# Patient Record
Sex: Female | Born: 1955 | Race: White | Hispanic: No | Marital: Single | Smoking: Never smoker
Health system: Southern US, Community
[De-identification: ages and names within clinical notes are randomized; demographics above are authoritative.]

## PROBLEM LIST (undated history)

## (undated) DIAGNOSIS — N289 Disorder of kidney and ureter, unspecified: Secondary | ICD-10-CM

## (undated) DIAGNOSIS — M25561 Pain in right knee: Secondary | ICD-10-CM

## (undated) DIAGNOSIS — M25562 Pain in left knee: Secondary | ICD-10-CM

## (undated) DIAGNOSIS — M1990 Unspecified osteoarthritis, unspecified site: Secondary | ICD-10-CM

## (undated) DIAGNOSIS — C801 Malignant (primary) neoplasm, unspecified: Secondary | ICD-10-CM

## (undated) DIAGNOSIS — I10 Essential (primary) hypertension: Secondary | ICD-10-CM

## (undated) DIAGNOSIS — C541 Malignant neoplasm of endometrium: Secondary | ICD-10-CM

## (undated) HISTORY — DX: Pain in right knee: M25.561

## (undated) HISTORY — PX: ABDOMINAL HYSTERECTOMY: SHX81

## (undated) HISTORY — DX: Unspecified osteoarthritis, unspecified site: M19.90

## (undated) HISTORY — DX: Pain in left knee: M25.562

## (undated) HISTORY — DX: Essential (primary) hypertension: I10

## (undated) HISTORY — DX: Malignant neoplasm of endometrium: C54.1

---

## 2004-12-21 ENCOUNTER — Emergency Department: Payer: Self-pay | Admitting: Emergency Medicine

## 2010-04-14 ENCOUNTER — Ambulatory Visit: Payer: Self-pay | Admitting: Family Medicine

## 2010-07-13 ENCOUNTER — Ambulatory Visit: Payer: Self-pay | Admitting: Family Medicine

## 2010-08-23 ENCOUNTER — Ambulatory Visit: Payer: Self-pay | Admitting: Obstetrics and Gynecology

## 2010-08-26 ENCOUNTER — Ambulatory Visit: Payer: Self-pay | Admitting: Obstetrics and Gynecology

## 2010-08-31 LAB — PATHOLOGY REPORT

## 2010-09-07 ENCOUNTER — Ambulatory Visit: Payer: Self-pay | Admitting: Gynecologic Oncology

## 2010-09-10 ENCOUNTER — Ambulatory Visit: Payer: Self-pay | Admitting: Obstetrics and Gynecology

## 2010-09-14 ENCOUNTER — Inpatient Hospital Stay: Payer: Self-pay | Admitting: Obstetrics and Gynecology

## 2010-09-20 LAB — PATHOLOGY REPORT

## 2010-10-04 ENCOUNTER — Ambulatory Visit: Payer: Self-pay | Admitting: Gynecologic Oncology

## 2010-11-04 ENCOUNTER — Ambulatory Visit: Payer: Self-pay | Admitting: Gynecologic Oncology

## 2011-02-08 ENCOUNTER — Ambulatory Visit: Payer: Self-pay | Admitting: Gynecologic Oncology

## 2011-02-09 LAB — CA 125: CA 125: 9.8 U/mL (ref 0.0–34.0)

## 2011-03-04 ENCOUNTER — Ambulatory Visit: Payer: Self-pay | Admitting: Gynecologic Oncology

## 2011-06-14 ENCOUNTER — Ambulatory Visit: Payer: Self-pay | Admitting: Gynecologic Oncology

## 2011-06-15 LAB — CA 125: CA 125: 10 U/mL (ref 0.0–34.0)

## 2011-07-04 ENCOUNTER — Ambulatory Visit: Payer: Self-pay | Admitting: Gynecologic Oncology

## 2011-10-18 ENCOUNTER — Ambulatory Visit: Payer: Self-pay | Admitting: Gynecologic Oncology

## 2011-10-19 LAB — CA 125: CA 125: 9.5 U/mL (ref 0.0–34.0)

## 2011-11-04 ENCOUNTER — Ambulatory Visit: Payer: Self-pay | Admitting: Gynecologic Oncology

## 2012-01-04 HISTORY — PX: GALLBLADDER SURGERY: SHX652

## 2012-02-21 ENCOUNTER — Ambulatory Visit: Payer: Self-pay | Admitting: Gynecologic Oncology

## 2012-02-22 LAB — CA 125: CA 125: 9.5 U/mL (ref 0.0–34.0)

## 2012-03-03 ENCOUNTER — Ambulatory Visit: Payer: Self-pay | Admitting: Gynecologic Oncology

## 2012-08-22 ENCOUNTER — Ambulatory Visit: Payer: Self-pay | Admitting: Family Medicine

## 2012-09-01 ENCOUNTER — Emergency Department: Payer: Self-pay | Admitting: Emergency Medicine

## 2012-09-01 LAB — CBC
HCT: 43.9 % (ref 35.0–47.0)
MCH: 29.5 pg (ref 26.0–34.0)
MCV: 88 fL (ref 80–100)
RBC: 5.01 10*6/uL (ref 3.80–5.20)
RDW: 13.4 % (ref 11.5–14.5)
WBC: 8.5 10*3/uL (ref 3.6–11.0)

## 2012-09-01 LAB — URINALYSIS, COMPLETE
Bacteria: NONE SEEN
Glucose,UR: NEGATIVE mg/dL (ref 0–75)
Ketone: NEGATIVE
Nitrite: NEGATIVE
Ph: 5 (ref 4.5–8.0)
RBC,UR: 3 /HPF (ref 0–5)
Specific Gravity: 1.015 (ref 1.003–1.030)
Squamous Epithelial: 3

## 2012-09-01 LAB — LIPASE, BLOOD: Lipase: 119 U/L (ref 73–393)

## 2012-09-01 LAB — COMPREHENSIVE METABOLIC PANEL
Albumin: 4.3 g/dL (ref 3.4–5.0)
Anion Gap: 6 — ABNORMAL LOW (ref 7–16)
BUN: 22 mg/dL — ABNORMAL HIGH (ref 7–18)
Bilirubin,Total: 0.8 mg/dL (ref 0.2–1.0)
Calcium, Total: 9.5 mg/dL (ref 8.5–10.1)
EGFR (African American): 50 — ABNORMAL LOW
Glucose: 105 mg/dL — ABNORMAL HIGH (ref 65–99)
SGOT(AST): 25 U/L (ref 15–37)
SGPT (ALT): 42 U/L (ref 12–78)
Sodium: 135 mmol/L — ABNORMAL LOW (ref 136–145)

## 2012-09-03 ENCOUNTER — Ambulatory Visit: Payer: Self-pay | Admitting: Family Medicine

## 2012-09-26 ENCOUNTER — Ambulatory Visit: Payer: Self-pay | Admitting: Surgery

## 2012-10-03 ENCOUNTER — Ambulatory Visit: Payer: Self-pay | Admitting: Family Medicine

## 2012-10-04 ENCOUNTER — Ambulatory Visit: Payer: Self-pay | Admitting: Surgery

## 2012-10-04 LAB — HEPATIC FUNCTION PANEL A (ARMC)
Albumin: 3.9 g/dL (ref 3.4–5.0)
SGPT (ALT): 36 U/L (ref 12–78)
Total Protein: 7.5 g/dL (ref 6.4–8.2)

## 2012-10-05 LAB — PATHOLOGY REPORT

## 2012-11-05 ENCOUNTER — Ambulatory Visit: Payer: Self-pay | Admitting: Family Medicine

## 2012-12-03 ENCOUNTER — Ambulatory Visit: Payer: Self-pay | Admitting: Family Medicine

## 2013-01-09 ENCOUNTER — Ambulatory Visit: Payer: Self-pay | Admitting: Family Medicine

## 2013-02-03 ENCOUNTER — Ambulatory Visit: Payer: Self-pay | Admitting: Family Medicine

## 2013-08-07 ENCOUNTER — Ambulatory Visit: Payer: Self-pay | Admitting: Family Medicine

## 2013-08-22 ENCOUNTER — Ambulatory Visit: Payer: Self-pay | Admitting: Family Medicine

## 2013-08-27 ENCOUNTER — Emergency Department: Payer: Self-pay | Admitting: Emergency Medicine

## 2013-08-27 LAB — HEPATIC FUNCTION PANEL A (ARMC)
ALT: 25 U/L
Albumin: 3.2 g/dL — ABNORMAL LOW (ref 3.4–5.0)
Alkaline Phosphatase: 75 U/L
Bilirubin, Direct: <0.1 mg/dL (ref 0.00–0.20)
Bilirubin,Total: 0.7 mg/dL (ref 0.2–1.0)
SGOT(AST): 21 U/L (ref 15–37)
Total Protein: 7.1 g/dL (ref 6.4–8.2)

## 2013-08-27 LAB — BODY FLUID CELL COUNT WITH DIFFERENTIAL
BASOS ABS: 0 %
EOS PCT: 0 %
LYMPHS PCT: 50 %
NEUTROS PCT: 4 %
NUCLEATED CELL COUNT: 230 /mm3
OTHER MONONUCLEAR CELLS: 47 %
Other Cells BF: 0 %

## 2013-08-27 LAB — CBC
HCT: 42.6 % (ref 35.0–47.0)
HGB: 13.8 g/dL (ref 12.0–16.0)
MCH: 26.7 pg (ref 26.0–34.0)
MCHC: 32.5 g/dL (ref 32.0–36.0)
MCV: 82 fL (ref 80–100)
PLATELETS: 491 10*3/uL — AB (ref 150–440)
RBC: 5.18 10*6/uL (ref 3.80–5.20)
RDW: 14 % (ref 11.5–14.5)
WBC: 6.6 10*3/uL (ref 3.6–11.0)

## 2013-08-27 LAB — BASIC METABOLIC PANEL
Anion Gap: 12 (ref 7–16)
BUN: 12 mg/dL (ref 7–18)
CHLORIDE: 104 mmol/L (ref 98–107)
Calcium, Total: 8.8 mg/dL (ref 8.5–10.1)
Co2: 25 mmol/L (ref 21–32)
Creatinine: 1.19 mg/dL (ref 0.60–1.30)
EGFR (African American): 57 — ABNORMAL LOW
GFR CALC NON AF AMER: 50 — AB
Glucose: 90 mg/dL (ref 65–99)
Osmolality: 281 (ref 275–301)
Potassium: 4 mmol/L (ref 3.5–5.1)
SODIUM: 141 mmol/L (ref 136–145)

## 2013-08-27 LAB — TROPONIN I: Troponin-I: <0.02 ng/mL

## 2013-08-27 LAB — ALBUMIN, FLUID (OTHER): Body Fluid Albumin: 2.5 g/dL

## 2013-08-27 LAB — GLUCOSE, SEROUS FLUID: GLUCOSE, BODY FLUID: 47 mg/dL

## 2013-08-27 LAB — PROTIME-INR
INR: 1
Prothrombin Time: 12.6 secs (ref 11.5–14.7)

## 2013-08-27 LAB — AMYLASE, BODY FLUID: Amylase, Body Fluid: 80 U/L

## 2013-08-31 LAB — BODY FLUID CULTURE

## 2013-09-10 ENCOUNTER — Institutional Professional Consult (permissible substitution): Payer: Self-pay | Admitting: Internal Medicine

## 2013-09-24 ENCOUNTER — Emergency Department: Payer: Self-pay | Admitting: Emergency Medicine

## 2013-09-24 LAB — CBC WITH DIFFERENTIAL/PLATELET
BASOS ABS: 0 10*3/uL (ref 0.0–0.1)
Basophil %: 0.6 %
EOS PCT: 1.7 %
Eosinophil #: 0.1 10*3/uL (ref 0.0–0.7)
HCT: 42.8 % (ref 35.0–47.0)
HGB: 13.4 g/dL (ref 12.0–16.0)
Lymphocyte #: 1.5 10*3/uL (ref 1.0–3.6)
Lymphocyte %: 18.5 %
MCH: 25.5 pg — ABNORMAL LOW (ref 26.0–34.0)
MCHC: 31.3 g/dL — ABNORMAL LOW (ref 32.0–36.0)
MCV: 82 fL (ref 80–100)
Monocyte #: 0.6 x10 3/mm (ref 0.2–0.9)
Monocyte %: 6.9 %
Neutrophil #: 5.8 10*3/uL (ref 1.4–6.5)
Neutrophil %: 72.3 %
PLATELETS: 495 10*3/uL — AB (ref 150–440)
RBC: 5.24 10*6/uL — AB (ref 3.80–5.20)
RDW: 14.5 % (ref 11.5–14.5)
WBC: 8 10*3/uL (ref 3.6–11.0)

## 2013-09-24 LAB — COMPREHENSIVE METABOLIC PANEL
Albumin: 2.8 g/dL — ABNORMAL LOW (ref 3.4–5.0)
Alkaline Phosphatase: 80 U/L
Anion Gap: 4 — ABNORMAL LOW (ref 7–16)
BUN: 20 mg/dL — ABNORMAL HIGH (ref 7–18)
Bilirubin,Total: 0.5 mg/dL (ref 0.2–1.0)
CALCIUM: 9.1 mg/dL (ref 8.5–10.1)
CREATININE: 1.26 mg/dL (ref 0.60–1.30)
Chloride: 105 mmol/L (ref 98–107)
Co2: 29 mmol/L (ref 21–32)
EGFR (Non-African Amer.): 46 — ABNORMAL LOW
GFR CALC AF AMER: 54 — AB
Glucose: 83 mg/dL (ref 65–99)
Osmolality: 277 (ref 275–301)
Potassium: 4.4 mmol/L (ref 3.5–5.1)
SGOT(AST): 31 U/L (ref 15–37)
SGPT (ALT): 29 U/L
Sodium: 138 mmol/L (ref 136–145)
TOTAL PROTEIN: 6.7 g/dL (ref 6.4–8.2)

## 2013-09-24 LAB — PROTIME-INR
INR: 1
PROTHROMBIN TIME: 13.3 s (ref 11.5–14.7)

## 2013-10-03 DIAGNOSIS — K7460 Unspecified cirrhosis of liver: Secondary | ICD-10-CM | POA: Insufficient documentation

## 2013-10-08 ENCOUNTER — Ambulatory Visit: Payer: Self-pay | Admitting: Gastroenterology

## 2013-10-11 ENCOUNTER — Ambulatory Visit: Payer: Self-pay | Admitting: Oncology

## 2013-10-11 LAB — CBC CANCER CENTER
Basophil #: 0.1 x10 3/mm (ref 0.0–0.1)
Basophil %: 1 %
Eosinophil #: 0.2 x10 3/mm (ref 0.0–0.7)
Eosinophil %: 2.2 %
HCT: 42.6 % (ref 35.0–47.0)
HGB: 13.6 g/dL (ref 12.0–16.0)
Lymphocyte %: 21.9 %
Lymphs Abs: 2.2 x10 3/mm (ref 1.0–3.6)
MCH: 25.7 pg — ABNORMAL LOW (ref 26.0–34.0)
MCHC: 31.9 g/dL — ABNORMAL LOW (ref 32.0–36.0)
MCV: 80 fL (ref 80–100)
Monocyte #: 0.6 x10 3/mm (ref 0.2–0.9)
Monocyte %: 6.3 %
Neutrophil #: 6.8 x10 3/mm — ABNORMAL HIGH (ref 1.4–6.5)
Neutrophil %: 68.6 %
Platelet: 522 x10 3/mm — ABNORMAL HIGH (ref 150–440)
RBC: 5.3 x10 6/mm — ABNORMAL HIGH (ref 3.80–5.20)
RDW: 15.2 % — ABNORMAL HIGH (ref 11.5–14.5)
WBC: 9.9 x10 3/mm (ref 3.6–11.0)

## 2013-10-11 LAB — COMPREHENSIVE METABOLIC PANEL WITH GFR
Albumin: 3.1 g/dL — ABNORMAL LOW (ref 3.4–5.0)
Alkaline Phosphatase: 99 U/L
Anion Gap: 9 (ref 7–16)
BUN: 27 mg/dL — ABNORMAL HIGH (ref 7–18)
Bilirubin,Total: 0.5 mg/dL (ref 0.2–1.0)
Calcium, Total: 9.1 mg/dL (ref 8.5–10.1)
Chloride: 98 mmol/L (ref 98–107)
Co2: 27 mmol/L (ref 21–32)
Creatinine: 1.56 mg/dL — ABNORMAL HIGH (ref 0.60–1.30)
EGFR (African American): 44 — ABNORMAL LOW
EGFR (Non-African Amer.): 36 — ABNORMAL LOW
Glucose: 106 mg/dL — ABNORMAL HIGH (ref 65–99)
Osmolality: 274 (ref 275–301)
Potassium: 4.1 mmol/L (ref 3.5–5.1)
SGOT(AST): 21 U/L (ref 15–37)
SGPT (ALT): 31 U/L
Sodium: 134 mmol/L — ABNORMAL LOW (ref 136–145)
Total Protein: 6.8 g/dL (ref 6.4–8.2)

## 2013-10-11 LAB — PROTIME-INR
INR: 1
Prothrombin Time: 13 secs (ref 11.5–14.7)

## 2013-10-13 LAB — CA 125: CA 125: 631.1 U/mL — AB (ref 0.0–34.0)

## 2013-10-15 ENCOUNTER — Ambulatory Visit: Payer: Self-pay | Admitting: Gastroenterology

## 2013-10-21 ENCOUNTER — Ambulatory Visit: Payer: Self-pay | Admitting: Oncology

## 2013-10-22 ENCOUNTER — Ambulatory Visit: Payer: Self-pay | Admitting: Oncology

## 2013-10-31 ENCOUNTER — Ambulatory Visit: Payer: Self-pay | Admitting: Vascular Surgery

## 2013-11-03 ENCOUNTER — Ambulatory Visit: Payer: Self-pay | Admitting: Oncology

## 2013-11-06 LAB — CBC CANCER CENTER
BASOS ABS: 0.1 x10 3/mm (ref 0.0–0.1)
Basophil %: 0.7 %
EOS PCT: 1.7 %
Eosinophil #: 0.2 x10 3/mm (ref 0.0–0.7)
HCT: 42 % (ref 35.0–47.0)
HGB: 13.1 g/dL (ref 12.0–16.0)
Lymphocyte #: 1.5 x10 3/mm (ref 1.0–3.6)
Lymphocyte %: 14.3 %
MCH: 24.9 pg — ABNORMAL LOW (ref 26.0–34.0)
MCHC: 31.1 g/dL — ABNORMAL LOW (ref 32.0–36.0)
MCV: 80 fL (ref 80–100)
MONO ABS: 0.7 x10 3/mm (ref 0.2–0.9)
Monocyte %: 6.5 %
NEUTROS ABS: 7.9 x10 3/mm — AB (ref 1.4–6.5)
NEUTROS PCT: 76.8 %
Platelet: 397 x10 3/mm (ref 150–440)
RBC: 5.24 10*6/uL — ABNORMAL HIGH (ref 3.80–5.20)
RDW: 15.5 % — ABNORMAL HIGH (ref 11.5–14.5)
WBC: 10.3 x10 3/mm (ref 3.6–11.0)

## 2013-11-06 LAB — COMPREHENSIVE METABOLIC PANEL
ALK PHOS: 125 U/L — AB
ALT: 39 U/L
Albumin: 3.1 g/dL — ABNORMAL LOW (ref 3.4–5.0)
Anion Gap: 10 (ref 7–16)
BILIRUBIN TOTAL: 0.6 mg/dL (ref 0.2–1.0)
BUN: 30 mg/dL — AB (ref 7–18)
Calcium, Total: 9.6 mg/dL (ref 8.5–10.1)
Chloride: 98 mmol/L (ref 98–107)
Co2: 27 mmol/L (ref 21–32)
Creatinine: 1.42 mg/dL — ABNORMAL HIGH (ref 0.60–1.30)
EGFR (African American): 49 — ABNORMAL LOW
GFR CALC NON AF AMER: 40 — AB
GLUCOSE: 143 mg/dL — AB (ref 65–99)
OSMOLALITY: 279 (ref 275–301)
Potassium: 4 mmol/L (ref 3.5–5.1)
SGOT(AST): 25 U/L (ref 15–37)
Sodium: 135 mmol/L — ABNORMAL LOW (ref 136–145)
Total Protein: 7.4 g/dL (ref 6.4–8.2)

## 2013-11-13 LAB — CBC CANCER CENTER
BASOS ABS: 0.1 x10 3/mm (ref 0.0–0.1)
Basophil %: 0.9 %
Eosinophil #: 0.3 x10 3/mm (ref 0.0–0.7)
Eosinophil %: 2.6 %
HCT: 41.1 % (ref 35.0–47.0)
HGB: 13.1 g/dL (ref 12.0–16.0)
Lymphocyte #: 2 x10 3/mm (ref 1.0–3.6)
Lymphocyte %: 18.2 %
MCH: 25.1 pg — ABNORMAL LOW (ref 26.0–34.0)
MCHC: 31.8 g/dL — AB (ref 32.0–36.0)
MCV: 79 fL — AB (ref 80–100)
Monocyte #: 1.3 x10 3/mm — ABNORMAL HIGH (ref 0.2–0.9)
Monocyte %: 11.5 %
NEUTROS ABS: 7.5 x10 3/mm — AB (ref 1.4–6.5)
Neutrophil %: 66.8 %
Platelet: 304 x10 3/mm (ref 150–440)
RBC: 5.21 10*6/uL — ABNORMAL HIGH (ref 3.80–5.20)
RDW: 15.3 % — AB (ref 11.5–14.5)
WBC: 11.2 x10 3/mm — AB (ref 3.6–11.0)

## 2013-11-19 LAB — CBC CANCER CENTER
Basophil #: 0.1 x10 3/mm (ref 0.0–0.1)
Basophil %: 0.3 %
Eosinophil #: 0.1 x10 3/mm (ref 0.0–0.7)
Eosinophil %: 0.3 %
HCT: 39.6 % (ref 35.0–47.0)
HGB: 12.4 g/dL (ref 12.0–16.0)
Lymphocyte #: 1.7 x10 3/mm (ref 1.0–3.6)
Lymphocyte %: 8.7 %
MCH: 25 pg — ABNORMAL LOW (ref 26.0–34.0)
MCHC: 31.3 g/dL — ABNORMAL LOW (ref 32.0–36.0)
MCV: 80 fL (ref 80–100)
MONO ABS: 1 x10 3/mm — AB (ref 0.2–0.9)
Monocyte %: 5.3 %
NEUTROS PCT: 85.4 %
Neutrophil #: 16.2 x10 3/mm — ABNORMAL HIGH (ref 1.4–6.5)
Platelet: 192 x10 3/mm (ref 150–440)
RBC: 4.96 10*6/uL (ref 3.80–5.20)
RDW: 16.3 % — ABNORMAL HIGH (ref 11.5–14.5)
WBC: 19 x10 3/mm — AB (ref 3.6–11.0)

## 2013-11-27 LAB — CBC CANCER CENTER
Basophil #: 0.1 x10 3/mm (ref 0.0–0.1)
Basophil %: 1.1 %
Eosinophil #: 0 x10 3/mm (ref 0.0–0.7)
Eosinophil %: 0.2 %
HCT: 36 % (ref 35.0–47.0)
HGB: 11.6 g/dL — AB (ref 12.0–16.0)
Lymphocyte #: 1.5 x10 3/mm (ref 1.0–3.6)
Lymphocyte %: 17.9 %
MCH: 25.8 pg — ABNORMAL LOW (ref 26.0–34.0)
MCHC: 32.2 g/dL (ref 32.0–36.0)
MCV: 80 fL (ref 80–100)
Monocyte #: 0.7 x10 3/mm (ref 0.2–0.9)
Monocyte %: 8.4 %
NEUTROS ABS: 6.1 x10 3/mm (ref 1.4–6.5)
Neutrophil %: 72.4 %
PLATELETS: 503 x10 3/mm — AB (ref 150–440)
RBC: 4.49 10*6/uL (ref 3.80–5.20)
RDW: 17.4 % — ABNORMAL HIGH (ref 11.5–14.5)
WBC: 8.4 x10 3/mm (ref 3.6–11.0)

## 2013-12-02 LAB — COMPREHENSIVE METABOLIC PANEL
ALBUMIN: 3.2 g/dL — AB (ref 3.4–5.0)
AST: 14 U/L — AB (ref 15–37)
Alkaline Phosphatase: 138 U/L — ABNORMAL HIGH
Anion Gap: 12 (ref 7–16)
BILIRUBIN TOTAL: 0.4 mg/dL (ref 0.2–1.0)
BUN: 22 mg/dL — ABNORMAL HIGH (ref 7–18)
CALCIUM: 9.6 mg/dL (ref 8.5–10.1)
CHLORIDE: 101 mmol/L (ref 98–107)
CREATININE: 1.13 mg/dL (ref 0.60–1.30)
Co2: 28 mmol/L (ref 21–32)
GFR CALC NON AF AMER: 52 — AB
GLUCOSE: 134 mg/dL — AB (ref 65–99)
Osmolality: 287 (ref 275–301)
POTASSIUM: 3.4 mmol/L — AB (ref 3.5–5.1)
SGPT (ALT): 25 U/L
SODIUM: 141 mmol/L (ref 136–145)
Total Protein: 7 g/dL (ref 6.4–8.2)

## 2013-12-02 LAB — CBC CANCER CENTER
BASOS ABS: 0.1 x10 3/mm (ref 0.0–0.1)
BASOS PCT: 1 %
EOS ABS: 0.1 x10 3/mm (ref 0.0–0.7)
Eosinophil %: 1.4 %
HCT: 36 % (ref 35.0–47.0)
HGB: 11.5 g/dL — AB (ref 12.0–16.0)
LYMPHS ABS: 1.7 x10 3/mm (ref 1.0–3.6)
LYMPHS PCT: 19.1 %
MCH: 25.8 pg — ABNORMAL LOW (ref 26.0–34.0)
MCHC: 32.1 g/dL (ref 32.0–36.0)
MCV: 80 fL (ref 80–100)
MONO ABS: 0.6 x10 3/mm (ref 0.2–0.9)
MONOS PCT: 6.5 %
NEUTROS ABS: 6.3 x10 3/mm (ref 1.4–6.5)
Neutrophil %: 72 %
PLATELETS: 507 x10 3/mm — AB (ref 150–440)
RBC: 4.48 10*6/uL (ref 3.80–5.20)
RDW: 18 % — ABNORMAL HIGH (ref 11.5–14.5)
WBC: 8.7 x10 3/mm (ref 3.6–11.0)

## 2013-12-02 LAB — MAGNESIUM: MAGNESIUM: 1.6 mg/dL — AB

## 2013-12-03 ENCOUNTER — Ambulatory Visit: Payer: Self-pay | Admitting: Oncology

## 2013-12-09 LAB — CBC CANCER CENTER
BASOS ABS: 0.1 x10 3/mm (ref 0.0–0.1)
Basophil %: 2.2 %
EOS PCT: 3.2 %
Eosinophil #: 0.1 x10 3/mm (ref 0.0–0.7)
HCT: 38.1 % (ref 35.0–47.0)
HGB: 12.5 g/dL (ref 12.0–16.0)
LYMPHS PCT: 28.9 %
Lymphocyte #: 1.2 x10 3/mm (ref 1.0–3.6)
MCH: 25.9 pg — ABNORMAL LOW (ref 26.0–34.0)
MCHC: 32.7 g/dL (ref 32.0–36.0)
MCV: 79 fL — ABNORMAL LOW (ref 80–100)
MONOS PCT: 2.7 %
Monocyte #: 0.1 x10 3/mm — ABNORMAL LOW (ref 0.2–0.9)
NEUTROS ABS: 2.6 x10 3/mm (ref 1.4–6.5)
NEUTROS PCT: 63 %
PLATELETS: 361 x10 3/mm (ref 150–440)
RBC: 4.82 10*6/uL (ref 3.80–5.20)
RDW: 18 % — ABNORMAL HIGH (ref 11.5–14.5)
WBC: 4.1 x10 3/mm (ref 3.6–11.0)

## 2013-12-16 LAB — CBC CANCER CENTER
BASOS ABS: 0 x10 3/mm (ref 0.0–0.1)
Basophil %: 1.2 %
EOS ABS: 0.1 x10 3/mm (ref 0.0–0.7)
EOS PCT: 2.5 %
HCT: 39 % (ref 35.0–47.0)
HGB: 12.3 g/dL (ref 12.0–16.0)
LYMPHS ABS: 1.3 x10 3/mm (ref 1.0–3.6)
Lymphocyte %: 40 %
MCH: 26.4 pg (ref 26.0–34.0)
MCHC: 31.7 g/dL — ABNORMAL LOW (ref 32.0–36.0)
MCV: 84 fL (ref 80–100)
MONO ABS: 0.4 x10 3/mm (ref 0.2–0.9)
Monocyte %: 13.1 %
NEUTROS ABS: 1.4 x10 3/mm (ref 1.4–6.5)
Neutrophil %: 43.2 %
Platelet: 257 x10 3/mm (ref 150–440)
RBC: 4.67 10*6/uL (ref 3.80–5.20)
RDW: 20.5 % — ABNORMAL HIGH (ref 11.5–14.5)
WBC: 3.2 x10 3/mm — ABNORMAL LOW (ref 3.6–11.0)

## 2013-12-17 LAB — CA 125: CA 125: 282.1 U/mL — ABNORMAL HIGH (ref 0.0–34.0)

## 2013-12-23 LAB — CBC CANCER CENTER
BASOS ABS: 0.1 x10 3/mm (ref 0.0–0.1)
BASOS PCT: 1.1 %
EOS ABS: 0.1 x10 3/mm (ref 0.0–0.7)
Eosinophil %: 0.9 %
HCT: 38.9 % (ref 35.0–47.0)
HGB: 12.7 g/dL (ref 12.0–16.0)
Lymphocyte #: 1.3 x10 3/mm (ref 1.0–3.6)
Lymphocyte %: 20.6 %
MCH: 27.1 pg (ref 26.0–34.0)
MCHC: 32.6 g/dL (ref 32.0–36.0)
MCV: 83 fL (ref 80–100)
MONO ABS: 0.5 x10 3/mm (ref 0.2–0.9)
Monocyte %: 7.6 %
NEUTROS PCT: 69.8 %
Neutrophil #: 4.4 x10 3/mm (ref 1.4–6.5)
Platelet: 258 x10 3/mm (ref 150–440)
RBC: 4.68 10*6/uL (ref 3.80–5.20)
RDW: 21.8 % — AB (ref 11.5–14.5)
WBC: 6.3 x10 3/mm (ref 3.6–11.0)

## 2013-12-23 LAB — COMPREHENSIVE METABOLIC PANEL
ANION GAP: 10 (ref 7–16)
Albumin: 3.4 g/dL (ref 3.4–5.0)
Alkaline Phosphatase: 115 U/L
BILIRUBIN TOTAL: 0.5 mg/dL (ref 0.2–1.0)
BUN: 18 mg/dL (ref 7–18)
Calcium, Total: 9 mg/dL (ref 8.5–10.1)
Chloride: 105 mmol/L (ref 98–107)
Co2: 27 mmol/L (ref 21–32)
Creatinine: 1.15 mg/dL (ref 0.60–1.30)
EGFR (African American): >60
EGFR (Non-African Amer.): 51 — ABNORMAL LOW
GLUCOSE: 123 mg/dL — AB (ref 65–99)
Osmolality: 286 (ref 275–301)
POTASSIUM: 3.8 mmol/L (ref 3.5–5.1)
SGOT(AST): 17 U/L (ref 15–37)
SGPT (ALT): 29 U/L
SODIUM: 142 mmol/L (ref 136–145)
Total Protein: 7.1 g/dL (ref 6.4–8.2)

## 2013-12-30 LAB — CBC CANCER CENTER
Basophil #: 0 x10 3/mm (ref 0.0–0.1)
Basophil %: 0.8 %
EOS ABS: 0 x10 3/mm (ref 0.0–0.7)
Eosinophil %: 1.3 %
HCT: 36.4 % (ref 35.0–47.0)
HGB: 11.8 g/dL — AB (ref 12.0–16.0)
LYMPHS ABS: 0.9 x10 3/mm — AB (ref 1.0–3.6)
Lymphocyte %: 29.9 %
MCH: 27.3 pg (ref 26.0–34.0)
MCHC: 32.5 g/dL (ref 32.0–36.0)
MCV: 84 fL (ref 80–100)
MONO ABS: 0 x10 3/mm — AB (ref 0.2–0.9)
MONOS PCT: 1.4 %
Neutrophil #: 1.9 x10 3/mm (ref 1.4–6.5)
Neutrophil %: 66.6 %
Platelet: 200 x10 3/mm (ref 150–440)
RBC: 4.33 10*6/uL (ref 3.80–5.20)
RDW: 20.9 % — AB (ref 11.5–14.5)
WBC: 2.9 x10 3/mm — AB (ref 3.6–11.0)

## 2014-01-03 ENCOUNTER — Ambulatory Visit: Payer: Self-pay | Admitting: Oncology

## 2014-01-06 LAB — CBC CANCER CENTER
BASOS ABS: 0 x10 3/mm (ref 0.0–0.1)
BASOS PCT: 0.9 %
Eosinophil #: 0.1 x10 3/mm (ref 0.0–0.7)
Eosinophil %: 2.2 %
HCT: 39.9 % (ref 35.0–47.0)
HGB: 13 g/dL (ref 12.0–16.0)
Lymphocyte #: 1.4 x10 3/mm (ref 1.0–3.6)
Lymphocyte %: 49.1 %
MCH: 28.2 pg (ref 26.0–34.0)
MCHC: 32.4 g/dL (ref 32.0–36.0)
MCV: 87 fL (ref 80–100)
MONO ABS: 0.5 x10 3/mm (ref 0.2–0.9)
Monocyte %: 16.6 %
NEUTROS PCT: 31.2 %
Neutrophil #: 0.9 x10 3/mm — ABNORMAL LOW (ref 1.4–6.5)
Platelet: 343 x10 3/mm (ref 150–440)
RBC: 4.59 10*6/uL (ref 3.80–5.20)
RDW: 21.4 % — AB (ref 11.5–14.5)
WBC: 2.8 x10 3/mm — ABNORMAL LOW (ref 3.6–11.0)

## 2014-01-07 LAB — CA 125: CA 125: 140.8 U/mL — AB (ref 0.0–34.0)

## 2014-01-13 LAB — COMPREHENSIVE METABOLIC PANEL
AST: 15 U/L (ref 15–37)
Albumin: 3.4 g/dL (ref 3.4–5.0)
Alkaline Phosphatase: 115 U/L
Anion Gap: 8 (ref 7–16)
BILIRUBIN TOTAL: 0.3 mg/dL (ref 0.2–1.0)
BUN: 26 mg/dL — ABNORMAL HIGH (ref 7–18)
CALCIUM: 8.7 mg/dL (ref 8.5–10.1)
CHLORIDE: 103 mmol/L (ref 98–107)
CO2: 27 mmol/L (ref 21–32)
Creatinine: 1.15 mg/dL (ref 0.60–1.30)
EGFR (African American): >60
GFR CALC NON AF AMER: 51 — AB
Glucose: 113 mg/dL — ABNORMAL HIGH (ref 65–99)
OSMOLALITY: 281 (ref 275–301)
Potassium: 4.2 mmol/L (ref 3.5–5.1)
SGPT (ALT): 29 U/L
Sodium: 138 mmol/L (ref 136–145)
Total Protein: 7 g/dL (ref 6.4–8.2)

## 2014-01-13 LAB — CBC CANCER CENTER
BASOS ABS: 0.1 x10 3/mm (ref 0.0–0.1)
Basophil %: 1 %
Eosinophil #: 0 x10 3/mm (ref 0.0–0.7)
Eosinophil %: 0.9 %
HCT: 38.5 % (ref 35.0–47.0)
HGB: 12.8 g/dL (ref 12.0–16.0)
LYMPHS ABS: 1.8 x10 3/mm (ref 1.0–3.6)
LYMPHS PCT: 32.7 %
MCH: 28.7 pg (ref 26.0–34.0)
MCHC: 33.4 g/dL (ref 32.0–36.0)
MCV: 86 fL (ref 80–100)
Monocyte #: 0.6 x10 3/mm (ref 0.2–0.9)
Monocyte %: 10.7 %
NEUTROS ABS: 3 x10 3/mm (ref 1.4–6.5)
Neutrophil %: 54.7 %
PLATELETS: 260 x10 3/mm (ref 150–440)
RBC: 4.48 10*6/uL (ref 3.80–5.20)
RDW: 20.7 % — AB (ref 11.5–14.5)
WBC: 5.5 x10 3/mm (ref 3.6–11.0)

## 2014-01-15 LAB — CA 125: CA 125: 130.3 U/mL — AB (ref 0.0–34.0)

## 2014-01-20 LAB — CBC CANCER CENTER
Basophil #: 0 x10 3/mm (ref 0.0–0.1)
Basophil %: 1.3 %
Eosinophil #: 0 x10 3/mm (ref 0.0–0.7)
Eosinophil %: 1.4 %
HCT: 39.7 % (ref 35.0–47.0)
HGB: 13 g/dL (ref 12.0–16.0)
Lymphocyte #: 1.2 x10 3/mm (ref 1.0–3.6)
Lymphocyte %: 39.3 %
MCH: 28.6 pg (ref 26.0–34.0)
MCHC: 32.8 g/dL (ref 32.0–36.0)
MCV: 87 fL (ref 80–100)
Monocyte #: 0.1 x10 3/mm — ABNORMAL LOW (ref 0.2–0.9)
Monocyte %: 1.9 %
NEUTROS ABS: 1.8 x10 3/mm (ref 1.4–6.5)
Neutrophil %: 56.1 %
PLATELETS: 224 x10 3/mm (ref 150–440)
RBC: 4.57 10*6/uL (ref 3.80–5.20)
RDW: 19 % — ABNORMAL HIGH (ref 11.5–14.5)
WBC: 3.1 x10 3/mm — ABNORMAL LOW (ref 3.6–11.0)

## 2014-01-30 LAB — CBC CANCER CENTER
BASOS ABS: 0 x10 3/mm (ref 0.0–0.1)
Basophil %: 1 %
Eosinophil #: 0 x10 3/mm (ref 0.0–0.7)
Eosinophil %: 1.4 %
HCT: 41.4 % (ref 35.0–47.0)
HGB: 13.6 g/dL (ref 12.0–16.0)
Lymphocyte #: 1.5 x10 3/mm (ref 1.0–3.6)
Lymphocyte %: 45.9 %
MCH: 29.1 pg (ref 26.0–34.0)
MCHC: 32.7 g/dL (ref 32.0–36.0)
MCV: 89 fL (ref 80–100)
Monocyte #: 0.7 x10 3/mm (ref 0.2–0.9)
Monocyte %: 19.4 %
NEUTROS ABS: 1.1 x10 3/mm — AB (ref 1.4–6.5)
Neutrophil %: 32.3 %
Platelet: 277 x10 3/mm (ref 150–440)
RBC: 4.66 10*6/uL (ref 3.80–5.20)
RDW: 19.2 % — AB (ref 11.5–14.5)
WBC: 3.3 x10 3/mm — AB (ref 3.6–11.0)

## 2014-02-03 ENCOUNTER — Ambulatory Visit: Payer: Self-pay | Admitting: Oncology

## 2014-02-03 LAB — CBC CANCER CENTER
BASOS ABS: 0.1 x10 3/mm (ref 0.0–0.1)
Basophil %: 1.3 %
EOS PCT: 0.9 %
Eosinophil #: 0 x10 3/mm (ref 0.0–0.7)
HCT: 39.2 % (ref 35.0–47.0)
HGB: 13.2 g/dL (ref 12.0–16.0)
LYMPHS ABS: 1.2 x10 3/mm (ref 1.0–3.6)
LYMPHS PCT: 27.2 %
MCH: 29.7 pg (ref 26.0–34.0)
MCHC: 33.5 g/dL (ref 32.0–36.0)
MCV: 89 fL (ref 80–100)
MONOS PCT: 9.9 %
Monocyte #: 0.4 x10 3/mm (ref 0.2–0.9)
NEUTROS ABS: 2.7 x10 3/mm (ref 1.4–6.5)
Neutrophil %: 60.7 %
Platelet: 202 x10 3/mm (ref 150–440)
RBC: 4.42 10*6/uL (ref 3.80–5.20)
RDW: 18 % — ABNORMAL HIGH (ref 11.5–14.5)
WBC: 4.5 x10 3/mm (ref 3.6–11.0)

## 2014-02-03 LAB — BASIC METABOLIC PANEL
Anion Gap: 10 (ref 7–16)
BUN: 20 mg/dL — AB (ref 7–18)
CO2: 27 mmol/L (ref 21–32)
CREATININE: 1.23 mg/dL (ref 0.60–1.30)
Calcium, Total: 8.7 mg/dL (ref 8.5–10.1)
Chloride: 103 mmol/L (ref 98–107)
EGFR (African American): 57 — ABNORMAL LOW
EGFR (Non-African Amer.): 47 — ABNORMAL LOW
Glucose: 146 mg/dL — ABNORMAL HIGH (ref 65–99)
Osmolality: 285 (ref 275–301)
Potassium: 4 mmol/L (ref 3.5–5.1)
Sodium: 140 mmol/L (ref 136–145)

## 2014-02-10 LAB — CBC CANCER CENTER
BASOS PCT: 1.3 %
Basophil #: 0 x10 3/mm (ref 0.0–0.1)
EOS PCT: 0.8 %
Eosinophil #: 0 x10 3/mm (ref 0.0–0.7)
HCT: 40.7 % (ref 35.0–47.0)
HGB: 13.4 g/dL (ref 12.0–16.0)
LYMPHS ABS: 1.1 x10 3/mm (ref 1.0–3.6)
Lymphocyte %: 36.8 %
MCH: 29.2 pg (ref 26.0–34.0)
MCHC: 32.9 g/dL (ref 32.0–36.0)
MCV: 89 fL (ref 80–100)
MONO ABS: 0.1 x10 3/mm — AB (ref 0.2–0.9)
MONOS PCT: 2.1 %
NEUTROS ABS: 1.8 x10 3/mm (ref 1.4–6.5)
Neutrophil %: 59 %
PLATELETS: 164 x10 3/mm (ref 150–440)
RBC: 4.57 10*6/uL (ref 3.80–5.20)
RDW: 17 % — ABNORMAL HIGH (ref 11.5–14.5)
WBC: 3.1 x10 3/mm — AB (ref 3.6–11.0)

## 2014-02-28 ENCOUNTER — Inpatient Hospital Stay: Payer: Self-pay | Admitting: Internal Medicine

## 2014-03-04 ENCOUNTER — Ambulatory Visit
Admit: 2014-03-04 | Disposition: A | Discharge status: Home / Self Care | Payer: Self-pay | Attending: Oncology | Admitting: Oncology

## 2014-04-02 LAB — COMPREHENSIVE METABOLIC PANEL
ALBUMIN: 3.5 g/dL
ALK PHOS: 72 U/L
ANION GAP: 5 — AB (ref 7–16)
BILIRUBIN TOTAL: 0.5 mg/dL
BUN: 17 mg/dL
CHLORIDE: 105 mmol/L
CREATININE: 0.96 mg/dL
Calcium, Total: 8.9 mg/dL
Co2: 28 mmol/L
Glucose: 119 mg/dL — ABNORMAL HIGH
POTASSIUM: 3.7 mmol/L
SGOT(AST): 22 U/L
SGPT (ALT): 17 U/L
SODIUM: 138 mmol/L
Total Protein: 6.3 g/dL — ABNORMAL LOW

## 2014-04-02 LAB — CBC CANCER CENTER
Basophil #: 0 x10 3/mm (ref 0.0–0.1)
Basophil %: 0.8 %
Eosinophil #: 0.1 x10 3/mm (ref 0.0–0.7)
Eosinophil %: 2.2 %
HCT: 35.1 % (ref 35.0–47.0)
HGB: 11.6 g/dL — ABNORMAL LOW (ref 12.0–16.0)
Lymphocyte #: 1.3 x10 3/mm (ref 1.0–3.6)
Lymphocyte %: 30.6 %
MCH: 30 pg (ref 26.0–34.0)
MCHC: 33 g/dL (ref 32.0–36.0)
MCV: 91 fL (ref 80–100)
Monocyte #: 0.5 x10 3/mm (ref 0.2–0.9)
Monocyte %: 11.2 %
Neutrophil #: 2.4 x10 3/mm (ref 1.4–6.5)
Neutrophil %: 55.2 %
PLATELETS: 229 x10 3/mm (ref 150–440)
RBC: 3.85 10*6/uL (ref 3.80–5.20)
RDW: 16.5 % — ABNORMAL HIGH (ref 11.5–14.5)
WBC: 4.3 x10 3/mm (ref 3.6–11.0)

## 2014-04-04 ENCOUNTER — Ambulatory Visit
Admit: 2014-04-04 | Disposition: A | Discharge status: Home / Self Care | Payer: Self-pay | Attending: Oncology | Admitting: Oncology

## 2014-04-09 LAB — CBC CANCER CENTER
Basophil #: 0 x10 3/mm (ref 0.0–0.1)
Basophil %: 0.8 %
EOS ABS: 0.2 x10 3/mm (ref 0.0–0.7)
EOS PCT: 4.2 %
HCT: 38.6 % (ref 35.0–47.0)
HGB: 12.8 g/dL (ref 12.0–16.0)
Lymphocyte #: 1.2 x10 3/mm (ref 1.0–3.6)
Lymphocyte %: 26.7 %
MCH: 29.9 pg (ref 26.0–34.0)
MCHC: 33 g/dL (ref 32.0–36.0)
MCV: 91 fL (ref 80–100)
MONOS PCT: 6.5 %
Monocyte #: 0.3 x10 3/mm (ref 0.2–0.9)
Neutrophil #: 2.7 x10 3/mm (ref 1.4–6.5)
Neutrophil %: 61.8 %
PLATELETS: 116 x10 3/mm — AB (ref 150–440)
RBC: 4.27 10*6/uL (ref 3.80–5.20)
RDW: 16.3 % — ABNORMAL HIGH (ref 11.5–14.5)
WBC: 4.4 x10 3/mm (ref 3.6–11.0)

## 2014-04-16 LAB — CBC CANCER CENTER
BASOS ABS: 0 x10 3/mm (ref 0.0–0.1)
Basophil %: 0.5 %
Eosinophil #: 0.1 x10 3/mm (ref 0.0–0.7)
Eosinophil %: 2.1 %
HCT: 36.8 % (ref 35.0–47.0)
HGB: 12.1 g/dL (ref 12.0–16.0)
LYMPHS ABS: 1.1 x10 3/mm (ref 1.0–3.6)
Lymphocyte %: 21.2 %
MCH: 30 pg (ref 26.0–34.0)
MCHC: 32.9 g/dL (ref 32.0–36.0)
MCV: 91 fL (ref 80–100)
Monocyte #: 0.6 x10 3/mm (ref 0.2–0.9)
Monocyte %: 10.2 %
NEUTROS ABS: 3.6 x10 3/mm (ref 1.4–6.5)
Neutrophil %: 66 %
Platelet: 161 x10 3/mm (ref 150–440)
RBC: 4.04 10*6/uL (ref 3.80–5.20)
RDW: 16.8 % — AB (ref 11.5–14.5)
WBC: 5.4 x10 3/mm (ref 3.6–11.0)

## 2014-04-17 LAB — CA 125: CA 125: 33 U/mL (ref 0.0–34.0)

## 2014-04-18 ENCOUNTER — Ambulatory Visit
Admit: 2014-04-18 | Disposition: A | Discharge status: Home / Self Care | Payer: Self-pay | Attending: Gastroenterology | Admitting: Gastroenterology

## 2014-04-23 ENCOUNTER — Other Ambulatory Visit: Payer: Self-pay | Admitting: Oncology

## 2014-04-23 DIAGNOSIS — C541 Malignant neoplasm of endometrium: Secondary | ICD-10-CM

## 2014-04-23 LAB — COMPREHENSIVE METABOLIC PANEL
ALBUMIN: 3.8 g/dL
ALK PHOS: 90 U/L
ANION GAP: 7 (ref 7–16)
BILIRUBIN TOTAL: 0.9 mg/dL
BUN: 15 mg/dL
CHLORIDE: 102 mmol/L
CO2: 29 mmol/L
CREATININE: 1.02 mg/dL — AB
Calcium, Total: 8.9 mg/dL
EGFR (Non-African Amer.): 60 — ABNORMAL LOW
Glucose: 108 mg/dL — ABNORMAL HIGH
Potassium: 3.8 mmol/L
SGOT(AST): 32 U/L
SGPT (ALT): 30 U/L
SODIUM: 138 mmol/L
Total Protein: 6.6 g/dL

## 2014-04-23 LAB — URINALYSIS, COMPLETE
BACTERIA: NONE SEEN
Bilirubin,UR: NEGATIVE
Glucose,UR: NEGATIVE mg/dL (ref 0–75)
KETONE: NEGATIVE
NITRITE: NEGATIVE
PH: 5 (ref 4.5–8.0)
Specific Gravity: 1.019 (ref 1.003–1.030)

## 2014-04-23 LAB — CBC CANCER CENTER
BASOS ABS: 0 x10 3/mm (ref 0.0–0.1)
Basophil %: 0.5 %
EOS ABS: 0 x10 3/mm (ref 0.0–0.7)
Eosinophil %: 0.6 %
HCT: 35.4 % (ref 35.0–47.0)
HGB: 11.7 g/dL — ABNORMAL LOW (ref 12.0–16.0)
LYMPHS ABS: 0.9 x10 3/mm — AB (ref 1.0–3.6)
Lymphocyte %: 15.1 %
MCH: 30.4 pg (ref 26.0–34.0)
MCHC: 33.2 g/dL (ref 32.0–36.0)
MCV: 92 fL (ref 80–100)
MONO ABS: 0.6 x10 3/mm (ref 0.2–0.9)
Monocyte %: 9.6 %
NEUTROS PCT: 74.2 %
Neutrophil #: 4.4 x10 3/mm (ref 1.4–6.5)
PLATELETS: 170 x10 3/mm (ref 150–440)
RBC: 3.87 10*6/uL (ref 3.80–5.20)
RDW: 17.7 % — ABNORMAL HIGH (ref 11.5–14.5)
WBC: 6 x10 3/mm (ref 3.6–11.0)

## 2014-04-23 LAB — MAGNESIUM: Magnesium: 1.5 mg/dL — ABNORMAL LOW

## 2014-04-24 LAB — URINE CULTURE

## 2014-04-25 ENCOUNTER — Other Ambulatory Visit: Payer: Self-pay | Admitting: Oncology

## 2014-04-25 DIAGNOSIS — C541 Malignant neoplasm of endometrium: Secondary | ICD-10-CM

## 2014-04-25 NOTE — Op Note (Signed)
PATIENT NAME:  Emily Zamora, Emily Zamora MR#:  [id number] DATE OF BIRTH:  [birth year]  DATE OF PROCEDURE:  10/04/2012  PREOPERATIVE DIAGNOSIS: Biliary colic.   POSTOPERATIVE DIAGNOSIS: Biliary colic.   PROCEDURE PERFORMED: Robotically assisted laparoscopic cholecystectomy.   SURGEON: Lakina Mcintire A. Marina Gravel, MD  SURGICAL PROCTOR: Rodena Goldmann III, MD   TYPE OF ANESTHESIA: General oral endotracheal and local.   FINDINGS: Stones and chronic inflammatory changes in the right upper quadrant.   SPECIMENS: Gallbladder with contents to pathology.   ESTIMATED BLOOD LOSS: 25 mL.   DRAINS: None.   COUNTS: Lap and needle count correct x 2.   DESCRIPTION OF PROCEDURE: With the patient in the supine position, general oral endotracheal anesthesia was induced. The patient's right arm was padded and tucked at her side. The abdomen was sterilely prepped and draped with ChloraPrep solution. Timeout was observed.   A 12 mm Hasson trocar was placed through an open infra-umbilical position techique with stay sutures being passed through the fascia and pneumoperitoneum was established. An 8 mm da Vinci trocar was placed in the right lateral lower abdomen. In the left lower mid abdomen at the midclavicular line, a second 8 mm da Vinci port was placed. A 5 mm assistant port was placed in the right subcostal margin.   The da Vinci patient cart was then brought in over the patient's right shoulder, docked to the trocar sites and locked. Instruments were then placed under direct visualization. The gallbladder was grasped with the 5 mm lateral assistant port. I then moved to the surgeon's console.   Lateral traction was applied to Hartmann's pouch. Careful dissection of the hepatoduodenal ligament liberated a cystic artery with posterior branch and a cystic duct. The cystic duct was doubly clipped on the portal side with a 10 mm long Hem-o-lok applier. The cystic duct was then divided. The cystic artery was likewise critically  identified, doubly clipped on the portal side, with the 1 cm Hem-o-lok applier. Posterior branch was taken between single hemoclips. Critical view of safety cholecystectomy was achieved.  Further dissection in this area demonstrated no evidence of aberrant artery or bile duct. The specimen was then taken off the gallbladder fossa utilizing hook electrocautery. Point hemostasis was obtained with direct application of monopolar cautery.   At this point, the [employer] robot patient cart was then undocked. Utilizing the angled scope through a lateral trocar site, Endo Catch bag was used to retrieve the specimen. Pneumoperitoneum was then re-established. The right upper quadrant was irrigated with a total of 1 liter of normal saline and aspirated dry, and hemostasis appeared to be adequate on the operative field.   Ports were then removed. Hemostasis appeared to be adequate on the operative field.   The infraumbilical fascial defect was reapproximated with a figure-of-eight #0 Vicryl suture in vertical orientation, the existing stay sutures being tied to each other. A total of 30 mL of 0.25% plain Marcaine was infiltrated along all skin and fascial incisions prior to closure. 4-0 Monocryl in subcuticular fashion was used to reapproximate all skin edges followed by benzoin, Steri-Strips, Telfa and Tegaderm. The patient was then subsequently extubated and taken to the recovery room in stable and satisfactory condition by anesthesia services.    ____________________________ Jeannette How Marina Gravel, MD FACS mab:jm D: 10/04/2012 14:05:36 ET T: 10/04/2012 14:14:04 ET JOB#: [id number]  cc: Elta Guadeloupe A. Marina Gravel, MD, <Dictator> Hortencia Conradi MD ELECTRONICALLY SIGNED 10/04/2012 17:32

## 2014-04-26 NOTE — Op Note (Signed)
PATIENT NAME:  Emily Zamora, Emily Zamora MR#:  [id number] DATE OF BIRTH:  04-29-1955  DATE OF PROCEDURE:  10/31/2013  PREOPERATIVE DIAGNOSIS: Endometrial cancer.   POSTOPERATIVE DIAGNOSIS:  Endometrial cancer.  PROCEDURES:  1.  Ultrasound guidance for vascular access, right internal jugular vein.  2.  Fluoroscopic guidance for placement of catheter.  3.  Placement of CT compatible Port-A-Cath, right internal jugular vein.   SURGEON:  Algernon Huxley, MD   ANESTHESIA:  Local with moderate conscious sedation.   FLUOROSCOPY TIME:  Less than 1 minute.   CONTRAST:  Zero.   ESTIMATED BLOOD LOSS:  Minimal.  INDICATION FOR PROCEDURE:  A 59 year old female with endometrial cancer, she needs Port-A-Cath for chemotherapy and durable venous access. Risks and benefits were discussed. Informed consent was obtained.   DESCRIPTION OF THE PROCEDURE:  The patient was brought to the vascular and interventional radiology suite. The right neck and chest were sterilely prepped and draped, and a sterile surgical field was created. Ultrasound was used to help visualize a patent right internal jugular vein. This was then accessed under direct ultrasound guidance without difficulty with a Seldinger needle and a permanent image was recorded. A J-wire was placed. After skin nick and dilatation, the peel-away sheath was then placed over the wire. I then anesthetized an area under the clavicle approximately 2 fingerbreadths. A transverse incision was created and an inferior pocket was created with electrocautery and blunt dissection. The port was then brought onto the field, placed into the pocket and secured to the chest wall with 2 Prolene sutures. The catheter was connected to the port and tunneled from the subclavicular incision to the access site. Fluoroscopic guidance was used to cut the catheter to an appropriate length. The catheter was then placed through the peel-away sheath and the peel-away sheath was removed. The catheter  tip was parked in excellent location in the cavoatrial junction. The pocket was then irrigated with antibiotic-impregnated saline and the wound was closed with a running 3-0 Vicryl and a 4-0 Monocryl. The access incision was closed with a single 4-0 Monocryl. The Huber needle was used to withdraw blood and flush the port with heparinized saline. Dermabond was then placed as a dressing. The patient tolerated the procedure well and was taken to the recovery room in stable condition.    ____________________________ Algernon Huxley, MD jsd:TT D: 10/31/2013 09:13:17 ET T: 10/31/2013 20:07:33 ET JOB#: [id number]  cc: Algernon Huxley, MD, <Dictator> Algernon Huxley MD ELECTRONICALLY SIGNED 11/06/2013 8:56

## 2014-04-28 LAB — SURGICAL PATHOLOGY

## 2014-04-30 LAB — CBC CANCER CENTER
Basophil #: 0 x10 3/mm (ref 0.0–0.1)
Basophil %: 1.5 %
Eosinophil #: 0 x10 3/mm (ref 0.0–0.7)
Eosinophil %: 2 %
HCT: 35.6 % (ref 35.0–47.0)
HGB: 11.8 g/dL — ABNORMAL LOW (ref 12.0–16.0)
LYMPHS PCT: 38.7 %
Lymphocyte #: 0.6 x10 3/mm — ABNORMAL LOW (ref 1.0–3.6)
MCH: 29.8 pg (ref 26.0–34.0)
MCHC: 33.2 g/dL (ref 32.0–36.0)
MCV: 90 fL (ref 80–100)
MONOS PCT: 12.1 %
Monocyte #: 0.2 x10 3/mm (ref 0.2–0.9)
NEUTROS PCT: 45.7 %
Neutrophil #: 0.7 x10 3/mm — ABNORMAL LOW (ref 1.4–6.5)
Platelet: 78 x10 3/mm — ABNORMAL LOW (ref 150–440)
RBC: 3.97 10*6/uL (ref 3.80–5.20)
RDW: 16.2 % — AB (ref 11.5–14.5)
WBC: 1.6 x10 3/mm — CL (ref 3.6–11.0)

## 2014-05-02 ENCOUNTER — Other Ambulatory Visit: Payer: Self-pay | Admitting: *Deleted

## 2014-05-02 DIAGNOSIS — C541 Malignant neoplasm of endometrium: Secondary | ICD-10-CM

## 2014-05-04 NOTE — Discharge Summary (Signed)
PATIENT NAME:  Emily Zamora, Emily Zamora MR#:  [id number] DATE OF BIRTH:  Dec 14, 1955  DATE OF ADMISSION:  02/28/2014 DATE OF DISCHARGE:  03/01/2014   DISCHARGE DIAGNOSES:  1.  Right lower lobe subsegmental pulmonary embolism.  2.  Endometrial/peritoneal carcinoma.  3.  Hypertension.   DISCHARGE MEDICATIONS:  1.  Xarelto starter pack with fifteen 20 mg tablets.  2.  Multivitamin 1 tablet daily.  3.  Fiber Choice 1.5 grams oral daily.  4.  MiraLax 17 grams oral once a day as needed.  5.  Atenolol 100 mg oral once a day.  6.  Docusate sodium 250 mg two times a day.  7.  Duragesic 25 mcg patch every three days.  8.  Spironolactone 100 mg oral once a day.  9.  Albuterol 2 puffs inhaled four times a day as needed for shortness of breath.   DISCHARGE INSTRUCTIONS:  Are:   DIET:  Low sodium diet.  ACTIVITY:  As tolerated.  FOLLOW UP:  Follow up  in 1 to 2 weeks.   IMAGING STUDIES: CT scan of the chest showed a small right lower lobe subsegmental PE, nothing acute.   ADMITTING HISTORY AND PHYSICAL AND HOSPITAL COURSE:  Please see detailed H and P dictated previously. In brief, a 59 year old female patient with history of peritoneal  endometrial carcinoma presented to the hospital complaining of some chest pain. The patient had a CT scan of the chest done, which showed a tiny right lower lobe subsegmental PE was admitted to the hospitalist service. The patient initially was started on Lovenox after consulting with oncology. The patient has been transitioned to Xarelto for which she was provided a coupon by the case manager. The patient is presently well, not needing oxygen, ambulated well in the hallway.   Lungs sound clear. S1, S2 heard and will be discharged back home in a stable condition.   I have had a lengthy discussion with the patient regarding side effects from the blood thinners regarding bleeding risks and treatment for PE.   TIME SPENT ON DAY OF DISCHARGE IN DISCHARGE ACTIVITY:  40  minutes.     ____________________________ Leia Alf Maryellen Dowdle, MD srs:at D: 03/01/2014 14:08:32 ET T: 03/01/2014 20:55:29 ET JOB#: [id number]  cc: Alveta Heimlich R. Montavious Wierzba, MD, <Dictator> Neita Carp MD ELECTRONICALLY SIGNED 03/18/2014 8:23

## 2014-05-04 NOTE — Consult Note (Signed)
Note Type Consult   Chief Complaint:  Historian Patient   Positive Symptoms shortness of breath   Subjective: Chief Complaint/Diagnosis:   The patient is a 59 year old woman with stage IV endometroid carcinoma admitted on 02/28/2014 with a pulmonary embolism.  HPI:   The patient was diagnosed with stage Ia endometroid adenocarcinoma in 08/2010.  She underwent TAH/BSO and staging.  In 10/2013 PET scan revealed extensive disease with lymph node and bone metastasis.  Biopsy revealed adenocarcinoma most likely from endometrial cancer.  Tumor was ER positive. began carboplatin and Taxol on 11/06/2013.  She completed 4 cycles of chemotherapy.  By report, tumor markers have declined and follow-up CT scan revealed decreased disease. was last seen in the medical oncology clinic by Dr. Oliva Bustard on 01/30/2014.  At that time, she was seen in assessment prior to cycle #5 on 02/03/2014.  She is scheduled to receive cycle #6 on 03/03/2014. notes a recent history of shortness of breath and pleuritic chest pain.  She was directed to the ER.  Chest CT angiogram on 02/28/2014 revealed single tiny pulomonary embolism in one of the right lower lobe subsegmental pulmonary arteries.  There was no significant change in the metastatic disease.  Lower extremity duplex revealed no evidence of thrombosis. was started empirically on Lovenox.  She has tolerated it well.    Review of Systems:  General: weakness  Performance Status (ECOG): 1  HEENT: no complaints  Lungs: cough  SOB  pleuritic chest pain (improved)  Cardiac: no complaints  GI: pain  upper abdominal discomfort  GU: no complaints  Musculoskeletal: no complaints  Extremities: no complaints  Skin: no complaints  Neuro: no complaints  Endocrine: no complaints  Psych: no complaints  Pain ?: No complaints (0, none)  Emotional well-being: None  Review of Systems: All other systems were reviewed and found to be negative   Allergies:  Sulfa drugs:  Anaphylaxis  Tide detergent: Other, Rash, Hives  Significant History/PMH:   Stage 4 peritoneal Cancer:    Cirrhosis:    GERD:    endometerial cancer:    Arthritis:    Hypertension:    Migraines:    Hysterectomy:    Cholecystectomy:    Dilation and Curretage: with hysteroscopy, Aug 2012  Preventive Screening:  Has patient had any of the following test? Colonscopy  Mammography   Last Colonoscopy: 2013   Last Mammography: 2013   Smoking History: Smoking History Never Smoked.  PFSH: Family History: positive  Comments: breast cancer: sister  colon cancer: great aunt,  stomach cancer: great aunt,  Social History: negative alcohol, negative tobacco  Comments: Has never had screening colonoscopy  Additional Past Medical and Surgical History: Med:    HTN for 15 years, no secondary problems,             GERD,             arthritis of the knee and back,    Surg:  D&C   Home Medications: Medication Instructions Last Modified Date/Time  Xarelto Starter Pack 15 mg-20 mg oral tablet 1 unit(s) orally  27-Feb-16 10:06  Duragesic-25 25 mcg/hr transdermal film, extended release 1 patch transdermal every 72 hours *to be used with 51mcg patch for total dose of 42mcg* 26-Feb-16 13:42  ranitidine 150 mg oral tablet 1 tab(s) orally 2 times a day 26-Feb-16 13:42  albuterol CFC free 90 mcg/inh inhalation aerosol 2 puff(s) inhaled 4 times a day, As Needed - for Shortness of Breath 26-Feb-16 13:42  spironolactone 100 mg oral tablet 1 tab(s) orally once a day 26-Feb-16 13:42  docusate sodium sodium 250 mg oral capsule 1 cap(s) orally 2 times a day 26-Feb-16 13:42  atenolol 100 mg oral tablet 1 tab(s) orally once a day 26-Feb-16 13:42  MiraLax - oral powder for reconstitution 17 gram(s) orally once a day, As Needed 26-Feb-16 13:42  Fiber Choice 1.5 g oral tablet, chewable 1 tab(s) orally once a day 26-Feb-16 13:42  multivitamin 1 tab(s) orally once a day 26-Feb-16 13:42   Vital Signs:   :: Ht(CM): 163 Wt(KG): 78.6 BSA: 1.8 Temp: 96.9 Pulse: 73 RR: 16  BP: 137/90   Physical Exam:  General: well developed, well nourished, sitting comfortably on the medical unit in no acute distress  Mental Status: alert and oriented to person, place and time  Eyes: glasses, blue eyes, pupils equal, round, reactive to light and accommodation.  no conjunctivitis or scleral icterus  Head, Ears, Nose,Throat: wearing a cap, face symmetric, no Cushingoid features, no oral lesions  Neck, Thyroid: neck supple without massess or adenopathy.  Respiratory: clear to auscultation without rales, rhonchi, or wheezing  Cardiovascular: regular rate and rhythm, without murmur, rub or gallop  Gastrointestinal: soft, slightly tender (chronic), with active bowel sounds and no appreciable hepatosplenomegaly.  no masses.  Musculoskeletal: no muscular asymmetry.  no palpable cords.  no edema.  wearing Ted hose.  Skin: no rashes, ulcers, or lesions  Neurological: unremarkable.  Lymphatics: no palpable cervical, supraclavicular, axillary, or inguinal adenopathy  Psych: appropriate.   Laboratory Results:  Routine Chem:  27-Feb-16 05:03   Result Comment LABS - This specimen was collected through an   - indwelling catheter or arterial line.  - A minimum of 85mls of blood was wasted prior    - to collecting the sample.  Interpret  - results with caution.  Result(s) reported on 01 Mar 2014 at 05:51AM.  Routine Hem:  27-Feb-16 05:03   WBC (CBC) 4.7  RBC (CBC) 4.18  Hemoglobin (CBC) 12.5  Hematocrit (CBC) 38.2  Platelet Count (CBC)  149  MCV 92  MCH 29.8  MCHC 32.6  RDW  16.2  Neutrophil % 35.5  Lymphocyte % 52.3  Monocyte % 10.0  Eosinophil % 1.1  Basophil % 1.1  Neutrophil # 1.7  Lymphocyte # 2.5  Monocyte # 0.5  Eosinophil # 0.1  Basophil # 0.0   Lab Results Review:  Lab Results     Review Pathology Report:  Radiology Results: [place]:    26-Feb-16 16:00, [place] Color Flow Doppler Low Extrem Bilat  (Legs)  [place] Color Flow Doppler Low Extrem Bilat (Legs)   REASON FOR EXAM:    pulmonary embolism  COMMENTS:       PROCEDURE: [place]  - US DOPPLER LOW EXTR BILATERAL  - Feb 28 2014  4:00PM     CLINICAL DATA:  Shortness of breath for 1 week. Small right lower  lobe pulmonary embolus.    EXAM:  BILATERAL LOWER EXTREMITY VENOUS DOPPLER ULTRASOUND    TECHNIQUE:  Gray-scale sonography with graded compression, as well as color  Doppler and duplex ultrasound were performed to evaluate the lower  extremity deep venous systems from the level of the common femoral  vein and including the common femoral, femoral, profunda femoral,  popliteal and calf veins including the posterior tibial, peroneal  and gastrocnemius veins when visible. The superficial great  saphenous vein was also interrogated. Spectral Doppler wasutilized  to evaluate flow at rest and with distal augmentation maneuvers  in  the common femoral, femoral and popliteal veins.    COMPARISON:  None.    FINDINGS:  RIGHT LOWER EXTREMITY    Common Femoral Vein: No evidence of thrombus. Normal  compressibility, respiratory phasicity and response to augmentation.  Saphenofemoral Junction: No evidence of thrombus. Normal  compressibility and flow on color Doppler imaging.    Profunda Femoral Vein: No evidence of thrombus. Normal  compressibility and flow on color Doppler imaging.    Femoral Vein: No evidence of thrombus. Normal compressibility,  respiratory phasicity and response to augmentation.    Popliteal Vein: No evidence of thrombus. Normal compressibility,  respiratory phasicity and response to augmentation.    Calf Veins: Limited visualization.    Superficial Great Saphenous Vein: No evidence of thrombus. Normal  compressibility and flow on color Doppler imaging.    Venous Reflux:  None.    Other Findings: Right popliteal fossa Baker cyst measures 4.4 x 1.5  x 1.2 cm.    LEFT LOWER EXTREMITY    Common Femoral Vein:  No evidence of thrombus. Normal  compressibility, respiratory phasicity and response to augmentation.    Saphenofemoral Junction: No evidence of thrombus. Normal  compressibility and flow on color Doppler imaging.  Profunda Femoral Vein: No evidence of thrombus. Normal  compressibility and flow on color Doppler imaging.    Femoral Vein: No evidence of thrombus. Normal compressibility,  respiratory phasicity and response to augmentation.    Popliteal Vein: No evidence of thrombus. Normal compressibility,  respiratory phasicity and response to augmentation.    Calf Veins: Very limited visualization of the calf veins.    Superficial Great Saphenous Vein: No evidenceof thrombus. Normal  compressibility and flow on color Doppler imaging.    Venous Reflux:  None.  Other Findings:  None.     IMPRESSION:  No significant occlusive femoral popliteal DVT in either extremity.  Calf veins are not visualized well.    Incidental right popliteal fossa Baker cyst.      Electronically Signed    By: Jerilynn Mages.  Shick M.D.    On: 02/28/2014 16:02         Verified By: Earl Gala, M.D.,  LabUnknown:    26-Feb-16 12:52, CT ANGIOGRAPHY Chest with for PE  PACS Image     26-Feb-16 16:00, [place] Color Flow Doppler Low Extrem Bilat (Legs)  PACS Image    Assessment and Plan: Impression:   59 year old woman with stage IV endometroid carcinoma admitted with pulmonary embolism.  Lower extremity duplex reveals no evidence of DVT. she is doing well on Lovenox.  She denies any bruising or bleeding.  Plan:   1)  Discuss discharge on Lovenox, Xarelto, or Eliquis.  Discuss possible transition to Coumadin from Lovenox.  Discuss isses with each agent.  Discuss long term anticoagulation given metastatic disease. Follow-up in the oncology clinic as scheduled for chemotherapy on 03/03/2014.    Fax to Physician:  Physicians To Recieve Fax:    Jobe Gibbon - [phone].  Advance Directive:  Advance  Directive Theatre stage manager) no   Advance Directive Information Given patient refused   Electronic Signatures: Lequita Asal (MD)  (Signed 27-Feb-16 14:22)  Authored: Note Type, History of Present Illness, CC/HPI, Review of Systems, ALLERGIES, PAST MEDICAL HISTORY, Preventive Screening, Smoking Cessation, Patient Family Social History, HOME MEDICATIONS, Vital Signs, Physical Exam, Lab Results Review, Pathology Report Review, Rad Results Review, Assessment and Plan, Fax to Physician, Advance Directive   Last Updated: 27-Feb-16 14:22 by Nolon Stalls C (  MD) 

## 2014-05-04 NOTE — H&P (Signed)
PATIENT NAME:  Emily Zamora, Emily Zamora MR#:  [id number] DATE OF BIRTH:  21-Nov-1955  DATE OF ADMISSION:  02/28/2014  REFERRING PHYSICIAN: Elta Guadeloupe R. Jacqualine Code, MD  PRIMARY CARE PRACTITIONER: Ashok Norris, MD  PRIMARY ONCOLOGIST: Forest Gleason, MD   ADMITTING DOCTOR: Dr. Reece Levy   CHIEF COMPLAINT: Left-sided pleuritic chest pain with associated shortness of breath which started this morning.   HISTORY OF PRESENT ILLNESS: A 59 year old Caucasian female with a history of stage IV peritoneal carcinoma, hypertension, gastroesophageal reflux disease, a history of endometrial carcinoma, on chemotherapy under the care of oncologist, presents to the Emergency Room with complaints of left-sided pleuritic chest pain with associated shortness of breath which started this a.m. No associated fever, chills. No radiation of pain. In the Emergency Room, the patient was evaluated by the ED physician and workup revealed a CT chest significant for nonocclusive pulmonary embolism involving the right lower lobe. The ED physician contacted the patient's oncologist, Dr. Oliva Bustard, who recommended the patient to be admitted and was started on subcutaneous Lovenox for treatment of pulmonary embolism. The patient received some IV pain medications, following which her chest pain is under control. No history of any nausea, vomiting, diarrhea. No history of any focal weakness or numbness. No dysuria, frequency, or urgency. The patient is currently undergoing chemotherapy for stage IV peritoneal carcinomatosis. The last chemotherapy was given on 02/03/2014 and next chemotherapy is due next week.    PAST MEDICAL HISTORY: 1.   Hypertension.  2.  Endometrial carcinoma, status post hysterectomy in the past.  3.  Stage IV peritoneal carcinoma, on chemotherapy, under care of oncology.  4.  Gastroesophageal reflux disease.   PAST SURGICAL HISTORY: 1.  Hysterectomy.  2.  Cholecystectomy.   ALLERGIES: SULFA AND TIDE DETERGENT.   HOME MEDICATIONS:   1.  Albuterol inhalation aerosol 4 times a day as needed for shortness of breath.  2.  Atenolol 100 mg tablet, 1 tablet orally once a day.  3.  Docusate sodium 250 mg oral capsule, 1 capsule 2 times a day.  4.  Duragesic transdermal film extended release patch, 1 patch every 72 hours.  5.  FiberChoice 1.5 grams oral tablet chewable, 1 tablet orally once a day.  6.  MiraLax oral powder as needed for constipation.  7.  Multivitamin once a day.  8.  Ranitidine 150 mg tablet, 1 tablet orally 2 times a day.  9.  Spironolactone 100 mg tablet, 1 tablet orally once a day.   SOCIAL HISTORY: She is divorced. No history of smoking, alcohol or substance abuse.   FAMILY HISTORY: Mother with diabetes, hypertension, and heart disease, and kidney disease. Father with hypertension. Sister with hypertension and breast cancer.   REVIEW OF SYSTEMS: CONSTITUTIONAL: Negative for fever, chills. No fatigue. No generalized weakness.  EYES: Negative for blurred vision, double vision. No pain. No redness. No discharge.  ENT: Negative for tinnitus, ear pain, hearing loss, epistaxis, nasal discharge, or difficulty swallowing.  RESPIRATORY: Positive for left-sided pleuritic chest pain with associated shortness of breath. Negative for cough. No sputum. No hemoptysis.  CARDIOVASCULAR: Negative for palpitations, dizziness, syncopal episodes, orthopnea, dyspnea on exertion, or pedal edema.  GASTROINTESTINAL: Negative for nausea, vomiting, diarrhea, abdominal pain, hematemesis, melena, rectal bleeding or GERD symptoms.  GENITOURINARY: Negative for dysuria, frequency, urgency, or hematuria.  ENDOCRINE: Negative for polyuria or nocturia.  HEMATOLOGIC: Negative for anemia, easy bruising, bleeding.  INTEGUMENTARY: Negative for acne, skin rash or lesions.  MUSCULOSKELETAL: Negative for arthritis or gout.  NEUROLOGICAL: Negative for focal  weakness and no history of CVA, TIA or seizure disorder.  PSYCHIATRIC: Negative for  anxiety, insomnia, or depression.   PHYSICAL EXAMINATION: VITAL SIGNS: Temperature 98.8 Fahrenheit, pulse rate 89 per minute, respirations 18 per minute, blood pressure 125/82, oxygen saturation 98% on room air.  GENERAL: Well-developed, well-nourished, alert, in no acute distress, comfortably resting in the bed.  HEAD: Atraumatic, normocephalic.  EYES: Pupils are equal, react to light and accommodation. No conjunctival pallor. No icterus. Extraocular movements are intact.  NOSE: No drainage. No lesions.  EARS: No drainage. No external lesions.  ORAL CAVITY: No mucosal lesions. No exudates.  NECK: Supple. No JVD. No thyromegaly. No carotid bruit. Range of motion of neck within normal limits.  RESPIRATORY: Good respiratory effort. Not using accessory muscles of respiration. Bilateral vesicular breath sounds present. Occasional rales at the right base.  CARDIOVASCULAR: S1, S2 regular. No murmurs, gallops, or clicks. Pulses equal at carotid, femoral, and pedal pulses. No peripheral edema.  GASTROINTESTINAL: Abdomen is soft and nontender, mild generalized tenderness present. No guarding. No rigidity. No hepatosplenomegaly. Bowel sounds present and equal in all 4 quadrants.  GENITOURINARY: Deferred.  MUSCULOSKELETAL: No joint tenderness. Range of motion adequate. Strength and tone equal bilaterally.  SKIN: Inspection within normal limits. No obvious wounds. Chest port on the right side of the chest.  LYMPHATIC: No cervical lymphadenopathy.  VASCULAR: Good dorsalis pedis and posterior tibial pulses.  NEUROLOGICAL: Alert, awake, and oriented x 3. Cranial nerves II through XII grossly intact. No sensory deficit. Motor strength is 5/5 in both upper and lower extremities. DTRs 2+ bilaterally and symmetrical. Plantars downgoing.  PSYCHIATRIC: Alert, awake, and oriented x 3. Judgment and insight adequate. Memory and mood within normal limits.   LABORATORY DATA: Serum glucose 109, BUN 19, creatinine 1.07,  sodium 141, potassium 3.8, chloride 105, bicarbonate 28, total calcium 9.0, total protein 6.8, albumin 3.3, bilirubin 0.3, alkaline phosphatase 89, AST 14, ALT 16, total CK 25, CK-MB less than 0.5. Troponin less than 0.02. WBC 8.1, hemoglobin 12.6, hematocrit 38.1, platelet count 167. Prothrombin time 12.8, INR 0.9.   EKG: Normal sinus rhythm with a ventricular rate of 84 beats per minute. No acute ST-T changes.   X-ray chest: No acute abnormalities seen. Nodular changes noted in the left base similar to that noted on prior PET CT.   CT angiogram of the chest:  1.  Positive for a single, tiny pulmonary embolus. This is one of the right lower lobe subsegmental pulmonary arteries. A thrombus is nonocclusive.  2.  No significant interval changes in metastatic disease compared to 01/27/2014.   ASSESSMENT AND PLAN: A 59 year old Caucasian female with a history of stage IV peritoneal carcinoma, history of hypertension, gastroesophageal reflux disease, endometrial carcinoma presents with the complaints of left-sided pleuritic chest pain with associated shortness of breath, found to have nonocclusive pulmonary embolism on CT angiogram of the chest. Pulmonary embolism, right side, nonocclusive.   PLAN:  1.  Admit to medical floor, subcutaneous Lovenox, pain control measures, follow oxygen saturations. Will obtain bilateral lower extremity DVT studies to rule out any DVT.  2.  Stage IV peritoneal carcinoma. Under the care of oncology, undergoing chemotherapy. Last chemotherapy was on 02/03/2014.  Next chemotherapy due next week. The patient is stable. Continue care per oncology. Oncology consultation placed.  3.  Hypertension, stable on home medications, continue same.  4.  Gastroesophageal reflux disease, stable on PPI.  5.  DVT prophylaxis, subcutaneous Lovenox.  6.  Proton pump inhibitor.   CODE STATUS:  Full code.   TIME SPENT: 55 minutes.    ____________________________ Juluis Mire,  MD enr:at D: 02/28/2014 15:24:55 ET T: 02/28/2014 16:00:52 ET JOB#: [id number]  cc: Juluis Mire, MD, <Dictator> Ashok Norris, MD Martie Lee. Oliva Bustard, MD   Juluis Mire MD ELECTRONICALLY SIGNED 03/05/2014 8:24

## 2014-05-07 ENCOUNTER — Inpatient Hospital Stay: Payer: No Typology Code available for payment source | Attending: Oncology

## 2014-05-07 ENCOUNTER — Other Ambulatory Visit: Payer: Self-pay | Admitting: Oncology

## 2014-05-07 DIAGNOSIS — C541 Malignant neoplasm of endometrium: Secondary | ICD-10-CM | POA: Insufficient documentation

## 2014-05-07 DIAGNOSIS — Z9221 Personal history of antineoplastic chemotherapy: Secondary | ICD-10-CM | POA: Diagnosis not present

## 2014-05-07 DIAGNOSIS — I2699 Other pulmonary embolism without acute cor pulmonale: Secondary | ICD-10-CM | POA: Insufficient documentation

## 2014-05-07 DIAGNOSIS — Z79899 Other long term (current) drug therapy: Secondary | ICD-10-CM | POA: Insufficient documentation

## 2014-05-07 DIAGNOSIS — C779 Secondary and unspecified malignant neoplasm of lymph node, unspecified: Secondary | ICD-10-CM | POA: Diagnosis not present

## 2014-05-07 DIAGNOSIS — C7949 Secondary malignant neoplasm of other parts of nervous system: Secondary | ICD-10-CM | POA: Insufficient documentation

## 2014-05-07 DIAGNOSIS — Z7901 Long term (current) use of anticoagulants: Secondary | ICD-10-CM | POA: Insufficient documentation

## 2014-05-07 DIAGNOSIS — C7951 Secondary malignant neoplasm of bone: Secondary | ICD-10-CM | POA: Diagnosis not present

## 2014-05-07 DIAGNOSIS — Z79811 Long term (current) use of aromatase inhibitors: Secondary | ICD-10-CM | POA: Diagnosis not present

## 2014-05-07 DIAGNOSIS — F329 Major depressive disorder, single episode, unspecified: Secondary | ICD-10-CM | POA: Diagnosis not present

## 2014-05-07 DIAGNOSIS — Z1231 Encounter for screening mammogram for malignant neoplasm of breast: Secondary | ICD-10-CM

## 2014-05-07 LAB — CBC WITH DIFFERENTIAL/PLATELET
Basophils Absolute: 0 10*3/uL (ref 0–0.1)
EOS ABS: 0 10*3/uL (ref 0–0.7)
Eosinophils Relative: 1 %
HEMATOCRIT: 31.8 % — AB (ref 35.0–47.0)
Hemoglobin: 10.7 g/dL — ABNORMAL LOW (ref 12.0–16.0)
LYMPHS ABS: 1.7 10*3/uL (ref 1.0–3.6)
Lymphocytes Relative: 45 %
MCH: 29.9 pg (ref 26.0–34.0)
MCHC: 33.5 g/dL (ref 32.0–36.0)
MCV: 89.2 fL (ref 80.0–100.0)
Monocytes Absolute: 0.2 10*3/uL (ref 0.2–0.9)
Monocytes Relative: 6 %
NEUTROS PCT: 48 %
Neutro Abs: 1.8 10*3/uL (ref 1.4–6.5)
Platelets: 125 10*3/uL — ABNORMAL LOW (ref 150–440)
RBC: 3.56 MIL/uL — AB (ref 3.80–5.20)
RDW: 16.4 % — ABNORMAL HIGH (ref 11.5–14.5)
WBC: 3.7 10*3/uL (ref 3.6–11.0)

## 2014-05-13 ENCOUNTER — Telehealth: Payer: Self-pay | Admitting: *Deleted

## 2014-05-13 DIAGNOSIS — I2782 Chronic pulmonary embolism: Secondary | ICD-10-CM

## 2014-05-13 MED ORDER — WARFARIN SODIUM 5 MG PO TABS: 5.0000 mg | ORAL_TABLET | Freq: Every day | ORAL | Status: DC

## 2014-05-13 NOTE — Telephone Encounter (Signed)
Received a letter form insurance company that they will not cover Xarelto. Needs coumadin ordered

## 2014-05-13 NOTE — Telephone Encounter (Signed)
Have her bring a copy of the letter by office Start coumadin 5 mg daily and come in Monday for PT INR check. Pt informed of this and states she took her last Xarelto today. She asked the coumadin be called to [place].

## 2014-05-14 ENCOUNTER — Ambulatory Visit: Admission: RE | Admit: 2014-05-14 | Payer: Self-pay | Source: Ambulatory Visit

## 2014-05-14 ENCOUNTER — Other Ambulatory Visit: Payer: Self-pay

## 2014-05-14 ENCOUNTER — Inpatient Hospital Stay: Payer: No Typology Code available for payment source

## 2014-05-14 DIAGNOSIS — C541 Malignant neoplasm of endometrium: Secondary | ICD-10-CM

## 2014-05-14 LAB — CBC WITH DIFFERENTIAL/PLATELET
Basophils Absolute: 0 10*3/uL (ref 0–0.1)
Basophils Relative: 1 %
EOS ABS: 0 10*3/uL (ref 0–0.7)
EOS PCT: 1 %
HCT: 34.1 % — ABNORMAL LOW (ref 35.0–47.0)
Hemoglobin: 11.1 g/dL — ABNORMAL LOW (ref 12.0–16.0)
LYMPHS ABS: 1.1 10*3/uL (ref 1.0–3.6)
Lymphocytes Relative: 45 %
MCH: 30.1 pg (ref 26.0–34.0)
MCHC: 32.5 g/dL (ref 32.0–36.0)
MCV: 92.5 fL (ref 80.0–100.0)
Monocytes Absolute: 0.3 10*3/uL (ref 0.2–0.9)
Monocytes Relative: 11 %
Neutro Abs: 1 10*3/uL — ABNORMAL LOW (ref 1.4–6.5)
Neutrophils Relative %: 42 %
Platelets: 170 10*3/uL (ref 150–440)
RBC: 3.68 MIL/uL — ABNORMAL LOW (ref 3.80–5.20)
RDW: 17.4 % — ABNORMAL HIGH (ref 11.5–14.5)
WBC: 2.4 10*3/uL — ABNORMAL LOW (ref 3.6–11.0)

## 2014-05-16 ENCOUNTER — Ambulatory Visit
Admission: RE | Admit: 2014-05-16 | Discharge: 2014-05-16 | Disposition: A | Discharge status: Home / Self Care | Payer: No Typology Code available for payment source | Source: Ambulatory Visit | Attending: Oncology | Admitting: Oncology

## 2014-05-16 ENCOUNTER — Other Ambulatory Visit: Payer: Self-pay | Admitting: *Deleted

## 2014-05-16 DIAGNOSIS — C541 Malignant neoplasm of endometrium: Secondary | ICD-10-CM | POA: Insufficient documentation

## 2014-05-16 HISTORY — DX: Malignant (primary) neoplasm, unspecified: C80.1

## 2014-05-16 LAB — GLUCOSE, CAPILLARY: GLUCOSE-CAPILLARY: 94 mg/dL (ref 65–99)

## 2014-05-16 MED ORDER — FLUDEOXYGLUCOSE F - 18 (FDG) INJECTION
11.9600 | Freq: Once | INTRAVENOUS | Status: AC | PRN
  Administered 2014-05-16: 11.96 via INTRAVENOUS

## 2014-05-19 ENCOUNTER — Inpatient Hospital Stay (HOSPITAL_BASED_OUTPATIENT_CLINIC_OR_DEPARTMENT_OTHER): Payer: No Typology Code available for payment source | Admitting: Oncology

## 2014-05-19 ENCOUNTER — Telehealth: Payer: Self-pay | Admitting: *Deleted

## 2014-05-19 ENCOUNTER — Inpatient Hospital Stay: Payer: No Typology Code available for payment source

## 2014-05-19 VITALS — BP 133/90 | HR 76 | Temp 96.9°F | Wt 168.0 lb

## 2014-05-19 DIAGNOSIS — C541 Malignant neoplasm of endometrium: Secondary | ICD-10-CM

## 2014-05-19 DIAGNOSIS — C7951 Secondary malignant neoplasm of bone: Secondary | ICD-10-CM | POA: Diagnosis not present

## 2014-05-19 DIAGNOSIS — C55 Malignant neoplasm of uterus, part unspecified: Secondary | ICD-10-CM

## 2014-05-19 DIAGNOSIS — C779 Secondary and unspecified malignant neoplasm of lymph node, unspecified: Secondary | ICD-10-CM | POA: Diagnosis not present

## 2014-05-19 DIAGNOSIS — F329 Major depressive disorder, single episode, unspecified: Secondary | ICD-10-CM

## 2014-05-19 DIAGNOSIS — Z9221 Personal history of antineoplastic chemotherapy: Secondary | ICD-10-CM

## 2014-05-19 DIAGNOSIS — Z79899 Other long term (current) drug therapy: Secondary | ICD-10-CM

## 2014-05-19 DIAGNOSIS — I2782 Chronic pulmonary embolism: Secondary | ICD-10-CM

## 2014-05-19 DIAGNOSIS — Z79811 Long term (current) use of aromatase inhibitors: Secondary | ICD-10-CM

## 2014-05-19 DIAGNOSIS — Z7901 Long term (current) use of anticoagulants: Secondary | ICD-10-CM

## 2014-05-19 DIAGNOSIS — I2699 Other pulmonary embolism without acute cor pulmonale: Secondary | ICD-10-CM

## 2014-05-19 LAB — COMPREHENSIVE METABOLIC PANEL
ALT: 18 U/L (ref 14–54)
ANION GAP: 7 (ref 5–15)
AST: 24 U/L (ref 15–41)
Albumin: 4 g/dL (ref 3.5–5.0)
Alkaline Phosphatase: 79 U/L (ref 38–126)
BUN: 11 mg/dL (ref 6–20)
CALCIUM: 9.4 mg/dL (ref 8.9–10.3)
CO2: 29 mmol/L (ref 22–32)
Chloride: 104 mmol/L (ref 101–111)
Creatinine, Ser: 1.04 mg/dL — ABNORMAL HIGH (ref 0.44–1.00)
GFR, EST NON AFRICAN AMERICAN: 57 mL/min — AB (ref >60)
GLUCOSE: 109 mg/dL — AB (ref 65–99)
Potassium: 3.8 mmol/L (ref 3.5–5.1)
Sodium: 140 mmol/L (ref 135–145)
Total Bilirubin: 0.5 mg/dL (ref 0.3–1.2)
Total Protein: 7.5 g/dL (ref 6.5–8.1)

## 2014-05-19 LAB — CBC WITH DIFFERENTIAL/PLATELET
Basophils Absolute: 0 10*3/uL (ref 0–0.1)
Basophils Relative: 1 %
EOS PCT: 1 %
Eosinophils Absolute: 0 10*3/uL (ref 0–0.7)
HEMATOCRIT: 38.6 % (ref 35.0–47.0)
Hemoglobin: 12.4 g/dL (ref 12.0–16.0)
LYMPHS PCT: 26 %
Lymphs Abs: 1.2 10*3/uL (ref 1.0–3.6)
MCH: 29.9 pg (ref 26.0–34.0)
MCHC: 32.2 g/dL (ref 32.0–36.0)
MCV: 92.9 fL (ref 80.0–100.0)
MONO ABS: 0.4 10*3/uL (ref 0.2–0.9)
Monocytes Relative: 8 %
NEUTROS ABS: 3 10*3/uL (ref 1.4–6.5)
Neutrophils Relative %: 64 %
Platelets: 259 10*3/uL (ref 150–440)
RBC: 4.15 MIL/uL (ref 3.80–5.20)
RDW: 18.1 % — ABNORMAL HIGH (ref 11.5–14.5)
WBC: 4.6 10*3/uL (ref 3.6–11.0)

## 2014-05-19 LAB — PROTIME-INR
INR: 1.16
Prothrombin Time: 15 seconds (ref 11.4–15.0)

## 2014-05-19 MED ORDER — CITALOPRAM HYDROBROMIDE 20 MG PO TABS: 20.0000 mg | ORAL_TABLET | Freq: Every day | ORAL | Status: DC

## 2014-05-19 MED ORDER — FENTANYL 50 MCG/HR TD PT72: 50.0000 ug | MEDICATED_PATCH | TRANSDERMAL | Status: DC

## 2014-05-19 MED ORDER — FENTANYL 12 MCG/HR TD PT72: 12.5000 ug | MEDICATED_PATCH | TRANSDERMAL | Status: DC

## 2014-05-19 NOTE — Telephone Encounter (Signed)
Called pt to advise inform her that Dr. Oliva Bustard has given VO to increase her coumadin to 7.5 mg on Monday, Wednesday and Friday. She should continue 5 mg on Tuesday, Thursday, Saturday and Sunday.  Will recheck labs on 5/31. Pt verbalized understanding.

## 2014-05-19 NOTE — Telephone Encounter (Signed)
Has an appt today, but did not want to bring it up in front of patient. Not sure how to manage her anger issues and she has severe depression

## 2014-05-19 NOTE — Progress Notes (Signed)
Pt is having anger issues and depression.  She states she feels like death is an easy way out but is not having thoughts of suicide. Since Fentanyl has been increases pt has had hallucinations and hears music.

## 2014-05-23 ENCOUNTER — Ambulatory Visit
Admission: RE | Admit: 2014-05-23 | Discharge: 2014-05-23 | Disposition: A | Discharge status: Home / Self Care | Payer: No Typology Code available for payment source | Source: Ambulatory Visit | Attending: Oncology | Admitting: Oncology

## 2014-05-23 DIAGNOSIS — Z1231 Encounter for screening mammogram for malignant neoplasm of breast: Secondary | ICD-10-CM | POA: Insufficient documentation

## 2014-05-24 ENCOUNTER — Encounter: Payer: Self-pay | Admitting: Oncology

## 2014-05-24 DIAGNOSIS — C541 Malignant neoplasm of endometrium: Secondary | ICD-10-CM | POA: Insufficient documentation

## 2014-05-24 HISTORY — DX: Malignant neoplasm of endometrium: C54.1

## 2014-05-24 NOTE — Progress Notes (Signed)
[place] @ [facility] [phone]  Fax:([phone]) [place]: [birth year]  MR#: [id number]  LFY#:[id number]  Patient Care Team: Ashok Norris, MD as PCP - General (Family Medicine)  CHIEF COMPLAINT:  Chief Complaint  Patient presents with  . 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 abdominal paracentesis x1 negative for malignant cells(22nd September)  2.PET scan shows extensive disease with lymph node metastases and bone metastases T1 N0 M1 disease biopsy is consistent with adenocarcinoma GYN or lesion.  Most likely from the endometrial cancer.  Estrogen receptor positive.  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 abdominal paracentesis x1 negative for malignant cells(22nd September)  2.PET scan shows extensive disease with lymph node metastases and bone metastases T1 N0 M1 disease biopsy is consistent with adenocarcinoma GYN or lesion.  Most likely from the endometrial cancer.  Estrogen receptor positive.  HER-2/neu receptor pending(October, 2015)  3.patient started on chemotherapy with carboplatinum and Taxolfrom November 06, 2013 4.acute pulmonary embolism February of 2016 on xeralto. 5.  Because of insurance not paying for [place] patient has been switched over to Coumadin (May,  2016) 6.  Now on maintenance a Avastin (April, 2016)     Primary cancer of endometrium   10/23/2012 Initial Diagnosis Primary cancer of endometrium    No flowsheet data found.  INTERVAL HISTORY: Extremely-year-old lady with stage IV carcinoma of endometrium and has finished total 8cycles of chemotherapy with carboplatinum Taxol and Avastin.  Now on maintenance a Avastin therapy.  Patient is extremely depressed.  Getting angry and difficult to deal with (according to family) patient also agrees been depressed.  Planning to go to beach for few days of vacation does not want to continue treatment at present time.  Abdominal pain.  No nausea.  No vomiting.  No diarrhea. Recent has a history of pulmonary embolism.  Was on [place] however insurance did not approve XERALTO so patient is on Coumadin the dose of which has been increased to now 7.5 mg twice a week and 5 mg rest of the week daily.  REVIEW OF SYSTEMS:   Gen. status: Patient is extremely depressed performance status is 1 HEENT: Alopecia.  No soreness in the mouth. Lymphatic system: No abnormality. Lungs: Shortness of breath on exertion. Cardiac: No chest pain.  No palpitation GI: No nausea no vomiting.  Intermittent abdominal pain continues.  Well controlled with fentanyl patch. Lower extremity no swelling. Skin: No rash or ecchymosis Neurological system: No headache no dizziness  As per HPI. Otherwise, a complete review of systems is negatve.  PAST MEDICAL HISTORY: Past Medical History  Diagnosis Date  . Cancer   . Primary cancer of endometrium 05/24/2014  PAST SURGICAL HISTORY: No past surgical history on file.  FAMILY HISTORY Family History  Problem Relation Age of Onset  . Breast cancer Sister 58    GYNECOLOGIC HISTORY:  No LMP recorded.     ADVANCED DIRECTIVES:    HEALTH MAINTENANCE: History  Substance Use Topics  . Smoking status: Never Smoker   . Smokeless tobacco: Not on file  . Alcohol Use: Not on  file     Colonoscopy:  PAP:  Bone density:  Lipid panel:  Allergies  Allergen Reactions  . Sulfa Antibiotics Anaphylaxis    Current Outpatient Prescriptions  Medication Sig Dispense Refill  . atenolol (TENORMIN) 25 MG tablet Take by mouth.    . [facility] SENNA PLUS 8.6-50 MG per tablet Take 1 tablet by mouth 2 (two) times daily.  3  . famotidine (PEPCID) 20 MG tablet Take 20 mg by mouth 2 (two) times daily.    Marland Kitchen loratadine (CLARITIN) 10 MG tablet Take 10 mg by mouth daily.    . Multiple Vitamin (MULTIVITAMIN) capsule Take by mouth.    . ondansetron (ZOFRAN) 4 MG tablet Take 4 mg by mouth every 6 (six) hours as needed.  3  . oxyCODONE (ROXICODONE) 15 MG immediate release tablet   0  . warfarin (COUMADIN) 5 MG tablet Take 1 tablet (5 mg total) by mouth daily at 6 PM. 30 tablet 1  . citalopram (CELEXA) 20 MG tablet Take 1 tablet (20 mg total) by mouth daily. 30 tablet 3  . fentaNYL (DURAGESIC - DOSED MCG/HR) 12 MCG/HR Place 1 patch (12.5 mcg total) onto the skin every 3 (three) days. Use with 50mg patch for total 676m 5 patch 0  . fentaNYL (DURAGESIC - DOSED MCG/HR) 50 MCG/HR Place 1 patch (50 mcg total) onto the skin every 3 (three) days. Use with 1275mpatch for total of 59m73m patch 0  . psyllium (METAMUCIL) 58.6 % packet Take 1 packet by mouth daily.     No current facility-administered medications for this visit.    OBJECTIVE:  Filed Vitals:   05/19/14 1458  BP: 133/90  Pulse: 76  Temp: 96.9 F (36.1 C)     Body mass index is 28.82 kg/(m^2).    ECOG FS:1 - Symptomatic but completely ambulatory  PHYSICAL EXAM: GENERAL:  Well developed, well nourished, sitting comfortably in the exam room in no acute distress. MENTAL STATUS:  Alert and oriented to person, place and time. HEAD:  Alopecia.  Normocephalic, atraumatic, face symmetric, no Cushingoid features. EYES: pale  Pupils equal round and reactive to light and accomodation.  No conjunctivitis or scleral icterus. ENT:   Oropharynx clear without lesion.  Tongue normal. Mucous membranes moist.  RESPIRATORY:  Clear to auscultation without rales, wheezes or rhonchi. CARDIOVASCULAR:  Regular rate and rhythm without murmur, rub or gallop. . ABDOMEN:  Soft, non-tender, with active bowel sounds, and no hepatosplenomegaly.  No masses. Tenderness in lower abdominal area.  No palpable masses. BACK:  No CVA tenderness.  No tenderness on percussion of the back or rib cage. SKIN:  No rashes, ulcers or lesions. EXTREMITIES: No edema, no skin discoloration or tenderness.  No palpable cords. LYMPH NODES: No palpable cervical, supraclavicular, axillary or inguinal adenopathy  NEUROLOGICAL: Unremarkable. PSYCH:  Any depressed.   LAB RESULTS:  Appointment on 05/19/2014  Component Date Value Ref Range Status  . Prothrombin Time 05/19/2014 15.0  11.4 - 15.0 seconds Final  . INR 05/19/2014 1.16   Final  . WBC 05/19/2014 4.6  3.6 -  11.0 K/uL Final  . RBC 05/19/2014 4.15  3.80 - 5.20 MIL/uL Final  . Hemoglobin 05/19/2014 12.4  12.0 - 16.0 g/dL Final  . HCT 05/19/2014 38.6  35.0 - 47.0 % Final  . MCV 05/19/2014 92.9  80.0 - 100.0 fL Final  . MCH 05/19/2014 29.9  26.0 - 34.0 pg Final  . MCHC 05/19/2014 32.2  32.0 - 36.0 g/dL Final  . RDW 05/19/2014 18.1* 11.5 - 14.5 % Final  . Platelets 05/19/2014 259  150 - 440 K/uL Final  . Neutrophils Relative % 05/19/2014 64   Final  . Neutro Abs 05/19/2014 3.0  1.4 - 6.5 K/uL Final  . Lymphocytes Relative 05/19/2014 26   Final  . Lymphs Abs 05/19/2014 1.2  1.0 - 3.6 K/uL Final  . Monocytes Relative 05/19/2014 8   Final  . Monocytes Absolute 05/19/2014 0.4  0.2 - 0.9 K/uL Final  . Eosinophils Relative 05/19/2014 1   Final  . Eosinophils Absolute 05/19/2014 0.0  0 - 0.7 K/uL Final  . Basophils Relative 05/19/2014 1   Final  . Basophils Absolute 05/19/2014 0.0  0 - 0.1 K/uL Final  . Sodium 05/19/2014 140  135 - 145 mmol/L Final  . Potassium 05/19/2014 3.8  3.5 - 5.1 mmol/L Final  .  Chloride 05/19/2014 104  101 - 111 mmol/L Final  . CO2 05/19/2014 29  22 - 32 mmol/L Final  . Glucose, Bld 05/19/2014 109* 65 - 99 mg/dL Final  . BUN 05/19/2014 11  6 - 20 mg/dL Final  . Creatinine, Ser 05/19/2014 1.04* 0.44 - 1.00 mg/dL Final  . Calcium 05/19/2014 9.4  8.9 - 10.3 mg/dL Final  . Total Protein 05/19/2014 7.5  6.5 - 8.1 g/dL Final  . Albumin 05/19/2014 4.0  3.5 - 5.0 g/dL Final  . AST 05/19/2014 24  15 - 41 U/L Final  . ALT 05/19/2014 18  14 - 54 U/L Final  . Alkaline Phosphatase 05/19/2014 79  38 - 126 U/L Final  . Total Bilirubin 05/19/2014 0.5  0.3 - 1.2 mg/dL Final  . GFR calc non Af Amer 05/19/2014 57* >60 mL/min Final  . GFR calc Af Amer 05/19/2014 >60  >60 mL/min Final   Comment: (NOTE) The eGFR has been calculated using the CKD EPI equation. This calculation has not been validated in all clinical situations. eGFR's persistently <60 mL/min signify possible Chronic Kidney Disease.   . Anion gap 05/19/2014 7  5 - 15 Final     Lab Results  Component Value Date   CA125 33.0 04/16/2014     STUDIES: Nm Pet Image Restag (ps) Skull Base To Thigh  05/20/2014   CLINICAL DATA:  Subsequent Treatment strategy for endometrial cancer. Metastasis to bone, liver, and lung. Restaging. Chemotherapy 4/20.  EXAM: NUCLEAR MEDICINE PET SKULL BASE TO THIGH  TECHNIQUE: 12.0 mCi F-18 FDG was injected intravenously. Full-ring PET imaging was performed from the skull base to thigh after the radiotracer. CT data was obtained and used for attenuation correction and anatomic localization.  FASTING BLOOD GLUCOSE:  Value: 94 mg/dl  COMPARISON:  03/17/2014 abdominal pelvic CT. Most recent PET of 10/21/2013.  FINDINGS: NECK  Resolution of previously described left low jugular/ supraclavicular nodal metabolism. No residual adenopathy.  CHEST  A left lower lobe pulmonary nodule measures 1.0 cm and a S.U.V. max of 2.3 on image 113. On the prior PET, this nodule measured similar in size and a S.U.V.  max of 2.0. No residual thoracic nodal hypermetabolism or adenopathy.  ABDOMEN/PELVIS  Marked improvement in omental/peritoneal metastasis. Residual hypermetabolism and soft tissue thickening, including at a S.U.V. max of 5.3 anteriorly within the abdominal/pelvic junction on image 186. Resolution of hypermetabolic retroperitoneal adenopathy.  SKELETON  Improvement in osseous metastasis. Left acetabular lesion measures a S.U.V. max of 4.5 versus a S.U.V. max of 8.2 on the prior exam.  CT IMAGES PERFORMED FOR ATTENUATION CORRECTION  No cervical adenopathy. Mucosal thickening in the right maxillary sinus.  A right Port-A-Cath which terminates at the high right atrium. Mild cardiomegaly. Mild pulmonary artery enlargement, 3.2 cm. Calcified tiny left lower lobe pulmonary nodule which is consistent with old granulomatous disease. No hepatic parenchymal lesions or hypermetabolism. No pelvic adenopathy. Hysterectomy. Resolution of abdominal pelvic ascites.  IMPRESSION: 1. Since the prior PET of 10/21/2013, moderate to marked response to therapy. Resolution of thoracoabdominal nodal metastasis and improvement in hepatic metastasis. 2. Small volume residual omental/peritoneal metastasis, without ascites. 3. Similar size of the left lower lobe pulmonary nodule which demonstrates slightly increased, low-level hypermetabolism. Considerations include isolated pulmonary metastasis or a metachronous primary bronchogenic carcinoma. 4. Pulmonary artery enlargement suggests pulmonary arterial hypertension.   Electronically Signed   By: Abigail Miyamoto M.D.   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  Now on Coumadin as insurance does not approve XERALTO Severe depression Abdominal pain of undefined etiology  MEDICAL DECISION MAKING:  All lab data has been reviewed.  PET scan so significant response to the treatment.  At the patient's  request will hold off maintenance a Avastin till she  comes back from vacation. Antidepressant with Celexa has been started .  INR is subtherapeutic so those will be increased in the recheck pro time in 10 days Patient has estrogen receptor positive tumor and possibility of letrozole or Megace can be considered.   Patient expressed understanding and was in agreement with this plan. She also understands that She can call clinic at any time with any questions, concerns, or complaints.    Primary cancer of endometrium   Staging form: Corpus Uteri - Carcinoma, AJCC 7th Edition     Clinical: Stage IVB (T1a, N0, M1) - Signed by Forest Gleason, MD on 05/24/2014     Pathologic: No stage assigned - Marni Griffon, MD   05/24/2014 3:30 PM

## 2014-06-03 ENCOUNTER — Telehealth: Payer: Self-pay | Admitting: *Deleted

## 2014-06-03 ENCOUNTER — Inpatient Hospital Stay: Payer: No Typology Code available for payment source

## 2014-06-03 VITALS — BP 152/89 | HR 49 | Temp 96.8°F

## 2014-06-03 DIAGNOSIS — C541 Malignant neoplasm of endometrium: Secondary | ICD-10-CM

## 2014-06-03 LAB — PROTEIN, URINE, RANDOM: Total Protein, Urine: 52 mg/dL

## 2014-06-03 LAB — PROTIME-INR
INR: 2.16
PROTHROMBIN TIME: 24.2 s — AB (ref 11.4–15.0)

## 2014-06-03 MED ORDER — SODIUM CHLORIDE 0.9 % IV SOLN
Freq: Once | INTRAVENOUS | Status: DC
  Filled 2014-06-03: qty 250

## 2014-06-03 MED ORDER — FENTANYL 50 MCG/HR TD PT72: 50.0000 ug | MEDICATED_PATCH | TRANSDERMAL | Status: DC

## 2014-06-03 MED ORDER — FENTANYL 12 MCG/HR TD PT72: 12.5000 ug | MEDICATED_PATCH | TRANSDERMAL | Status: DC

## 2014-06-03 MED ORDER — CITALOPRAM HYDROBROMIDE 40 MG PO TABS: 40.0000 mg | ORAL_TABLET | Freq: Every day | ORAL | Status: DC

## 2014-06-03 MED ORDER — SODIUM CHLORIDE 0.9 % IV SOLN
10.0000 mg/kg | Freq: Once | INTRAVENOUS | Status: AC
  Administered 2014-06-03: 750 mg via INTRAVENOUS
  Filled 2014-06-03: qty 30

## 2014-06-03 MED ORDER — HEPARIN SOD (PORK) LOCK FLUSH 100 UNIT/ML IV SOLN
500.0000 [IU] | Freq: Once | INTRAVENOUS | Status: AC | PRN
  Administered 2014-06-03: 500 [IU]
  Filled 2014-06-03: qty 5

## 2014-06-03 NOTE — Telephone Encounter (Signed)
Informed that prescription is ready to pick up. Informed of increase in celexa dose to 40 mg, pt will double her 20 mg dose until gomne then pick up new rx for 40mg 

## 2014-06-05 ENCOUNTER — Telehealth: Payer: Self-pay | Admitting: *Deleted

## 2014-06-05 DIAGNOSIS — I2782 Chronic pulmonary embolism: Secondary | ICD-10-CM

## 2014-06-05 MED ORDER — WARFARIN SODIUM 5 MG PO TABS: ORAL_TABLET | ORAL | Status: DC

## 2014-06-05 NOTE — Telephone Encounter (Signed)
E scribe sent

## 2014-06-24 ENCOUNTER — Inpatient Hospital Stay: Payer: No Typology Code available for payment source

## 2014-06-24 ENCOUNTER — Inpatient Hospital Stay: Payer: No Typology Code available for payment source | Attending: Oncology | Admitting: Oncology

## 2014-06-24 VITALS — BP 122/87 | HR 67 | Temp 96.2°F | Wt 172.0 lb

## 2014-06-24 DIAGNOSIS — C779 Secondary and unspecified malignant neoplasm of lymph node, unspecified: Secondary | ICD-10-CM

## 2014-06-24 DIAGNOSIS — Z5111 Encounter for antineoplastic chemotherapy: Secondary | ICD-10-CM | POA: Insufficient documentation

## 2014-06-24 DIAGNOSIS — Z170 Estrogen receptor positive status [ER+]: Secondary | ICD-10-CM

## 2014-06-24 DIAGNOSIS — Z803 Family history of malignant neoplasm of breast: Secondary | ICD-10-CM

## 2014-06-24 DIAGNOSIS — C7951 Secondary malignant neoplasm of bone: Secondary | ICD-10-CM | POA: Diagnosis not present

## 2014-06-24 DIAGNOSIS — C541 Malignant neoplasm of endometrium: Secondary | ICD-10-CM | POA: Diagnosis not present

## 2014-06-24 DIAGNOSIS — I2699 Other pulmonary embolism without acute cor pulmonale: Secondary | ICD-10-CM | POA: Diagnosis not present

## 2014-06-24 DIAGNOSIS — Z79899 Other long term (current) drug therapy: Secondary | ICD-10-CM

## 2014-06-24 DIAGNOSIS — Z7901 Long term (current) use of anticoagulants: Secondary | ICD-10-CM | POA: Diagnosis not present

## 2014-06-24 LAB — COMPREHENSIVE METABOLIC PANEL
ALT: 18 U/L (ref 14–54)
AST: 23 U/L (ref 15–41)
Albumin: 3.8 g/dL (ref 3.5–5.0)
Alkaline Phosphatase: 67 U/L (ref 38–126)
Anion gap: 6 (ref 5–15)
BUN: 18 mg/dL (ref 6–20)
CALCIUM: 8.4 mg/dL — AB (ref 8.9–10.3)
CO2: 27 mmol/L (ref 22–32)
CREATININE: 1.06 mg/dL — AB (ref 0.44–1.00)
Chloride: 98 mmol/L — ABNORMAL LOW (ref 101–111)
GFR, EST NON AFRICAN AMERICAN: 56 mL/min — AB (ref >60)
GLUCOSE: 88 mg/dL (ref 65–99)
POTASSIUM: 4.3 mmol/L (ref 3.5–5.1)
SODIUM: 131 mmol/L — AB (ref 135–145)
Total Bilirubin: 0.8 mg/dL (ref 0.3–1.2)
Total Protein: 7 g/dL (ref 6.5–8.1)

## 2014-06-24 LAB — CBC WITH DIFFERENTIAL/PLATELET
BASOS PCT: 1 %
Basophils Absolute: 0 10*3/uL (ref 0–0.1)
EOS PCT: 2 %
Eosinophils Absolute: 0.1 10*3/uL (ref 0–0.7)
HEMATOCRIT: 40.4 % (ref 35.0–47.0)
Hemoglobin: 13 g/dL (ref 12.0–16.0)
Lymphocytes Relative: 31 %
Lymphs Abs: 1.3 10*3/uL (ref 1.0–3.6)
MCH: 29.8 pg (ref 26.0–34.0)
MCHC: 32.1 g/dL (ref 32.0–36.0)
MCV: 92.7 fL (ref 80.0–100.0)
MONO ABS: 0.4 10*3/uL (ref 0.2–0.9)
MONOS PCT: 9 %
Neutro Abs: 2.5 10*3/uL (ref 1.4–6.5)
Neutrophils Relative %: 57 %
Platelets: 232 10*3/uL (ref 150–440)
RBC: 4.36 MIL/uL (ref 3.80–5.20)
RDW: 16.2 % — ABNORMAL HIGH (ref 11.5–14.5)
WBC: 4.3 10*3/uL (ref 3.6–11.0)

## 2014-06-24 LAB — PROTEIN, URINE, RANDOM: Total Protein, Urine: 15 mg/dL

## 2014-06-24 LAB — PROTIME-INR
INR: 1.94
PROTHROMBIN TIME: 22.3 s — AB (ref 11.4–15.0)

## 2014-06-24 MED ORDER — LIDOCAINE-PRILOCAINE 2.5-2.5 % EX CREA: 1.0000 "application " | TOPICAL_CREAM | CUTANEOUS | Status: DC | PRN

## 2014-06-24 MED ORDER — HEPARIN SOD (PORK) LOCK FLUSH 100 UNIT/ML IV SOLN
500.0000 [IU] | Freq: Once | INTRAVENOUS | Status: AC | PRN
  Administered 2014-06-24: 500 [IU]

## 2014-06-24 MED ORDER — SODIUM CHLORIDE 0.9 % IV SOLN
Freq: Once | INTRAVENOUS | Status: AC
  Administered 2014-06-24: 16:00:00 via INTRAVENOUS
  Filled 2014-06-24: qty 1000

## 2014-06-24 MED ORDER — SODIUM CHLORIDE 0.9 % IV SOLN
10.0000 mg/kg | Freq: Once | INTRAVENOUS | Status: AC
  Administered 2014-06-24: 750 mg via INTRAVENOUS
  Filled 2014-06-24: qty 30

## 2014-06-24 NOTE — Progress Notes (Signed)
Patient does have living will. Never smoked. 

## 2014-06-25 ENCOUNTER — Encounter: Payer: Self-pay | Admitting: Oncology

## 2014-06-25 LAB — CA 125: CA 125: 31.7 U/mL (ref 0.0–38.1)

## 2014-06-25 NOTE — Progress Notes (Signed)
[place] @ [facility] [phone]  Fax:(336) [place]: 1955-11-29  MR#: [id number]  JKD#:[id number]  Patient Care Team: Ashok Norris, MD as PCP - General (Family Medicine)  CHIEF COMPLAINT:  Chief Complaint  Patient presents with  . 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 abdominal paracentesis x1 negative for malignant cells(22nd September)  2.PET scan shows extensive disease with lymph node metastases and bone metastases T1 N0 M1 disease biopsy is consistent with adenocarcinoma GYN or lesion.  Most likely from the endometrial cancer.  Estrogen receptor positive.  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 abdominal paracentesis x1 negative for malignant cells(22nd September)  2.PET scan shows extensive disease with lymph node metastases and bone metastases T1 N0 M1 disease biopsy is consistent with adenocarcinoma GYN or lesion.  Most likely from the endometrial cancer.  Estrogen receptor positive.  HER-2/neu receptor pending(October, 2015)  3.patient started on chemotherapy with carboplatinum and Taxolfrom November 06, 2013 4.acute pulmonary embolism February of 2016 on xeralto. 5.  Because of insurance not paying for [place] patient has been switched over to Coumadin (May,  2016) 6.  Now on maintenance a Avastin (April, 2016)     Primary cancer of endometrium   10/23/2012 Initial Diagnosis Primary cancer of endometrium    Oncology Flowsheet 06/03/2014 06/24/2014  bevacizumab (AVASTIN) IV 10 mg/kg 10 mg/kg    INTERVAL HISTORY: Extremely-year-old lady with stage IV carcinoma of endometrium and has finished total 8cycles of chemotherapy with carboplatinum Taxol and Avastin.  Now on maintenance a Avastin therapy.  Abdominal pain has improved.  Patient is less depressed.  Celexa was increased to 40 mg daily. Here for further follow-up and treatment consideration Because of abdominal pain is improving with decrease fentanyl patch to 50 g. Appetite is improving.  No nausea.  No vomiting.  No diarrhea. REVIEW OF SYSTEMS:   Gen. status: In general status is improved.  Patient is less depressed. HEENT: Alopecia.  No soreness in the mouth. Lymphatic system: No abnormality. Lungs: Shortness of breath on exertion. Cardiac: No chest pain.  No palpitation GI: No nausea no vomiting.  I abdominal pain is improved.  No nausea.  No vomiting.  Appetite is improving.  Lower extremity no swelling. Skin: No rash or ecchymosis Neurological system: No headache no dizziness Psychiatric system: Patient is much more cheerful less depressed  As per HPI. Otherwise, a complete review of systems is negatve.  PAST MEDICAL HISTORY: Past Medical History  Diagnosis Date  . Cancer   . Primary cancer of endometrium 05/24/2014    PAST SURGICAL HISTORY: No past surgical history on file.  FAMILY HISTORY Family History  Problem Relation Age of  Onset  . Breast cancer Sister 9    GYNECOLOGIC HISTORY:  No LMP recorded. Patient has had a hysterectomy.     ADVANCED DIRECTIVES:does have advanced healthcare directive   HEALTH MAINTENANCE: History  Substance Use Topics  . Smoking status: Never Smoker   . Smokeless tobacco: Not on file  . Alcohol Use: Not on file      Colonoscopy:  PAP:  Bone density:  Lipid panel:  Allergies  Allergen Reactions  . Sulfa Antibiotics Anaphylaxis    Current Outpatient Prescriptions  Medication Sig Dispense Refill  . atenolol (TENORMIN) 25 MG tablet Take by mouth.    . citalopram (CELEXA) 40 MG tablet Take 1 tablet (40 mg total) by mouth daily. 30 tablet 2  . [employer] SENNA PLUS 8.6-50 MG per tablet Take 1 tablet by mouth 2 (two) times daily.  3  . famotidine (PEPCID) 20 MG tablet Take 20 mg by mouth 2 (two) times daily.    . fentaNYL (DURAGESIC - DOSED MCG/HR) 50 MCG/HR Place 1 patch (50 mcg total) onto the skin every 3 (three) days. Use with 52mg patch for total of 643m 10 patch 0  . loratadine (CLARITIN) 10 MG tablet Take 10 mg by mouth daily.    . Multiple Vitamin (MULTIVITAMIN) capsule Take by mouth.    . ondansetron (ZOFRAN) 4 MG tablet Take 4 mg by mouth every 6 (six) hours as needed.  3  . oxyCODONE (ROXICODONE) 15 MG immediate release tablet   0  . psyllium (METAMUCIL) 58.6 % packet Take 1 packet by mouth daily.    . Marland Kitchenarfarin (COUMADIN) 5 MG tablet 7.5 mg on Monday, Wednesday and Friday.  5 mg on Tuesday, Thursday, Saturday and Sunday 45 tablet 1  . lidocaine-prilocaine (EMLA) cream Apply 1 application topically as needed. 30 g 3   No current facility-administered medications for this visit.    OBJECTIVE:  Filed Vitals:   06/24/14 1434  BP: 122/87  Pulse: 67  Temp: 96.2 F (35.7 C)     Body mass index is 29.5 kg/(m^2).    ECOG FS:1 - Symptomatic but completely ambulatory  PHYSICAL EXAM: GENERAL:  Well developed, well nourished, sitting comfortably in the exam room in no acute distress. MENTAL STATUS:  Alert and oriented to person, place and time. HEAD:  Alopecia.  Normocephalic, atraumatic, face symmetric, no Cushingoid features. EYES: pale  Pupils equal round and reactive to light and accomodation.  No conjunctivitis or scleral icterus. ENT:  Oropharynx clear without lesion.  Tongue normal.  Mucous membranes moist.  RESPIRATORY:  Clear to auscultation without rales, wheezes or rhonchi. CARDIOVASCULAR:  Regular rate and rhythm without murmur, rub or gallop. . ABDOMEN:  Soft, non-tender, with active bowel sounds, and no hepatosplenomegaly.  No masses. Tenderness in lower abdominal area.  No palpable masses. BACK:  No CVA tenderness.  No tenderness on percussion of the back or rib cage. SKIN:  No rashes, ulcers or lesions. EXTREMITIES: No edema, no skin discoloration or tenderness.  No palpable cords. LYMPH NODES: No palpable cervical, supraclavicular, axillary or inguinal adenopathy  NEUROLOGICAL: Unremarkable. PSYCH:  Less depressed LAB RESULTS:  Infusion on 06/24/2014  Component Date Value Ref Range Status  . Prothrombin Time 06/24/2014 22.3* 11.4 - 15.0 seconds Final  . INR 06/24/2014 1.94   Final  . WBC 06/24/2014 4.3  3.6 - 11.0 K/uL Final   A-LINE DRAW  . RBC 06/24/2014 4.36  3.80 - 5.20 MIL/uL Final  . Hemoglobin 06/24/2014 13.0  12.0 - 16.0  g/dL Final  . HCT 06/24/2014 40.4  35.0 - 47.0 % Final  . MCV 06/24/2014 92.7  80.0 - 100.0 fL Final  . MCH 06/24/2014 29.8  26.0 - 34.0 pg Final  . MCHC 06/24/2014 32.1  32.0 - 36.0 g/dL Final  . RDW 06/24/2014 16.2* 11.5 - 14.5 % Final  . Platelets 06/24/2014 232  150 - 440 K/uL Final  . Neutrophils Relative % 06/24/2014 57   Final  . Neutro Abs 06/24/2014 2.5  1.4 - 6.5 K/uL Final  . Lymphocytes Relative 06/24/2014 31   Final  . Lymphs Abs 06/24/2014 1.3  1.0 - 3.6 K/uL Final  . Monocytes Relative 06/24/2014 9   Final  . Monocytes Absolute 06/24/2014 0.4  0.2 - 0.9 K/uL Final  . Eosinophils Relative 06/24/2014 2   Final  . Eosinophils Absolute 06/24/2014 0.1  0 - 0.7 K/uL Final  . Basophils Relative 06/24/2014 1   Final  . Basophils Absolute 06/24/2014 0.0  0 - 0.1 K/uL Final  . Sodium 06/24/2014 131* 135 - 145 mmol/L Final  . Potassium 06/24/2014 4.3  3.5 - 5.1 mmol/L Final  . Chloride 06/24/2014 98* 101 - 111 mmol/L  Final  . CO2 06/24/2014 27  22 - 32 mmol/L Final  . Glucose, Bld 06/24/2014 88  65 - 99 mg/dL Final  . BUN 06/24/2014 18  6 - 20 mg/dL Final  . Creatinine, Ser 06/24/2014 1.06* 0.44 - 1.00 mg/dL Final  . Calcium 06/24/2014 8.4* 8.9 - 10.3 mg/dL Final  . Total Protein 06/24/2014 7.0  6.5 - 8.1 g/dL Final  . Albumin 06/24/2014 3.8  3.5 - 5.0 g/dL Final  . AST 06/24/2014 23  15 - 41 U/L Final  . ALT 06/24/2014 18  14 - 54 U/L Final  . Alkaline Phosphatase 06/24/2014 67  38 - 126 U/L Final  . Total Bilirubin 06/24/2014 0.8  0.3 - 1.2 mg/dL Final  . GFR calc non Af Amer 06/24/2014 56* >60 mL/min Final  . GFR calc Af Amer 06/24/2014 >60  >60 mL/min Final   Comment: (NOTE) The eGFR has been calculated using the CKD EPI equation. This calculation has not been validated in all clinical situations. eGFR's persistently <60 mL/min signify possible Chronic Kidney Disease.   . Anion gap 06/24/2014 6  5 - 15 Final  . CA 125 06/24/2014 31.7  0.0 - 38.1 U/mL Final   Comment: (NOTE) [employer] ECLIA methodology Performed At: [facility] [place], [place] [id number] Lindon Romp MD [id number]   . Total Protein, Urine 06/24/2014 15   Final   NO NORMAL RANGE ESTABLISHED FOR THIS TEST     Lab Results  Component Value Date   CA125 31.7 06/24/2014     STUDIES: PET scan which was done in May of 2016 has been reviewed with the patient. 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  Now on Coumadin as insurance does not approve XERALTO INR is therapeutic no change in the dose of Coumadin PET scan has been reviewed independently. MEDICAL DECISION MAKING:  Continue maintenance Avastin INR is therapeutic Tumor markers are stable His abdominal pain has improved.  Fentanyl patch will be decreased to 50 g Total duration of visit was 45 minutes.  50% or more time was spent in counseling patient and family  regarding prognosis and options of treatment and available resources   Patient expressed understanding and was in agreement with this plan. She also understands that  She can call clinic at any time with any questions, concerns, or complaints.    Primary cancer of endometrium   Staging form: Corpus Uteri - Carcinoma, AJCC 7th Edition     Clinical: Stage IVB (T1a, N0, M1) - Signed by Forest Gleason, MD on 05/24/2014     Pathologic: No stage assigned - Marni Griffon, MD   06/25/2014 9:05 PM

## 2014-07-09 ENCOUNTER — Telehealth: Payer: Self-pay | Admitting: *Deleted

## 2014-07-09 DIAGNOSIS — C541 Malignant neoplasm of endometrium: Secondary | ICD-10-CM

## 2014-07-09 MED ORDER — FENTANYL 50 MCG/HR TD PT72: 50.0000 ug | MEDICATED_PATCH | TRANSDERMAL | Status: DC

## 2014-07-09 NOTE — Telephone Encounter (Signed)
Informed that prescription is ready to pick up  

## 2014-07-09 NOTE — Addendum Note (Signed)
Addended by: Betti Cruz on: 07/09/2014 03:42 PM   Modules accepted: Orders, Medications

## 2014-07-15 ENCOUNTER — Encounter: Payer: Self-pay | Admitting: Oncology

## 2014-07-15 ENCOUNTER — Inpatient Hospital Stay: Payer: No Typology Code available for payment source | Attending: Oncology | Admitting: Oncology

## 2014-07-15 ENCOUNTER — Inpatient Hospital Stay: Payer: No Typology Code available for payment source

## 2014-07-15 VITALS — BP 126/85 | HR 85 | Temp 96.2°F | Wt 173.3 lb

## 2014-07-15 DIAGNOSIS — Z5111 Encounter for antineoplastic chemotherapy: Secondary | ICD-10-CM | POA: Diagnosis not present

## 2014-07-15 DIAGNOSIS — I2699 Other pulmonary embolism without acute cor pulmonale: Secondary | ICD-10-CM | POA: Insufficient documentation

## 2014-07-15 DIAGNOSIS — R109 Unspecified abdominal pain: Secondary | ICD-10-CM | POA: Diagnosis not present

## 2014-07-15 DIAGNOSIS — C7951 Secondary malignant neoplasm of bone: Secondary | ICD-10-CM | POA: Insufficient documentation

## 2014-07-15 DIAGNOSIS — Z7901 Long term (current) use of anticoagulants: Secondary | ICD-10-CM | POA: Insufficient documentation

## 2014-07-15 DIAGNOSIS — Z170 Estrogen receptor positive status [ER+]: Secondary | ICD-10-CM | POA: Diagnosis not present

## 2014-07-15 DIAGNOSIS — C541 Malignant neoplasm of endometrium: Secondary | ICD-10-CM

## 2014-07-15 DIAGNOSIS — I10 Essential (primary) hypertension: Secondary | ICD-10-CM | POA: Diagnosis not present

## 2014-07-15 DIAGNOSIS — K219 Gastro-esophageal reflux disease without esophagitis: Secondary | ICD-10-CM | POA: Insufficient documentation

## 2014-07-15 DIAGNOSIS — C779 Secondary and unspecified malignant neoplasm of lymph node, unspecified: Secondary | ICD-10-CM | POA: Diagnosis not present

## 2014-07-15 DIAGNOSIS — F329 Major depressive disorder, single episode, unspecified: Secondary | ICD-10-CM | POA: Insufficient documentation

## 2014-07-15 DIAGNOSIS — K7460 Unspecified cirrhosis of liver: Secondary | ICD-10-CM | POA: Insufficient documentation

## 2014-07-15 DIAGNOSIS — G4700 Insomnia, unspecified: Secondary | ICD-10-CM | POA: Insufficient documentation

## 2014-07-15 DIAGNOSIS — Z9049 Acquired absence of other specified parts of digestive tract: Secondary | ICD-10-CM | POA: Diagnosis not present

## 2014-07-15 DIAGNOSIS — M1990 Unspecified osteoarthritis, unspecified site: Secondary | ICD-10-CM | POA: Insufficient documentation

## 2014-07-15 DIAGNOSIS — Z79899 Other long term (current) drug therapy: Secondary | ICD-10-CM | POA: Insufficient documentation

## 2014-07-15 LAB — CBC WITH DIFFERENTIAL/PLATELET
BASOS ABS: 0 10*3/uL (ref 0–0.1)
Basophils Relative: 0 %
EOS PCT: 2 %
Eosinophils Absolute: 0.1 10*3/uL (ref 0–0.7)
HCT: 42.3 % (ref 35.0–47.0)
HEMOGLOBIN: 13.7 g/dL (ref 12.0–16.0)
Lymphocytes Relative: 29 %
Lymphs Abs: 1.7 10*3/uL (ref 1.0–3.6)
MCH: 29.9 pg (ref 26.0–34.0)
MCHC: 32.4 g/dL (ref 32.0–36.0)
MCV: 92.2 fL (ref 80.0–100.0)
Monocytes Absolute: 0.4 10*3/uL (ref 0.2–0.9)
Monocytes Relative: 6 %
NEUTROS ABS: 3.5 10*3/uL (ref 1.4–6.5)
NEUTROS PCT: 63 %
Platelets: 215 10*3/uL (ref 150–440)
RBC: 4.59 MIL/uL (ref 3.80–5.20)
RDW: 15.3 % — AB (ref 11.5–14.5)
WBC: 5.7 10*3/uL (ref 3.6–11.0)

## 2014-07-15 LAB — COMPREHENSIVE METABOLIC PANEL
ALT: 25 U/L (ref 14–54)
AST: 31 U/L (ref 15–41)
Albumin: 4.2 g/dL (ref 3.5–5.0)
Alkaline Phosphatase: 69 U/L (ref 38–126)
Anion gap: 7 (ref 5–15)
BILIRUBIN TOTAL: 0.8 mg/dL (ref 0.3–1.2)
BUN: 17 mg/dL (ref 6–20)
CO2: 27 mmol/L (ref 22–32)
Calcium: 8.7 mg/dL — ABNORMAL LOW (ref 8.9–10.3)
Chloride: 102 mmol/L (ref 101–111)
Creatinine, Ser: 1.19 mg/dL — ABNORMAL HIGH (ref 0.44–1.00)
GFR calc Af Amer: 56 mL/min — ABNORMAL LOW (ref >60)
GFR calc non Af Amer: 48 mL/min — ABNORMAL LOW (ref >60)
GLUCOSE: 161 mg/dL — AB (ref 65–99)
Potassium: 3.8 mmol/L (ref 3.5–5.1)
Sodium: 136 mmol/L (ref 135–145)
Total Protein: 7.3 g/dL (ref 6.5–8.1)

## 2014-07-15 LAB — PROTIME-INR
INR: 2.52
Prothrombin Time: 27.3 seconds — ABNORMAL HIGH (ref 11.4–15.0)

## 2014-07-15 MED ORDER — SODIUM CHLORIDE 0.9 % IJ SOLN
10.0000 mL | INTRAMUSCULAR | Status: DC | PRN
  Administered 2014-07-15: 10 mL
  Filled 2014-07-15: qty 10

## 2014-07-15 MED ORDER — HEPARIN SOD (PORK) LOCK FLUSH 100 UNIT/ML IV SOLN
500.0000 [IU] | Freq: Once | INTRAVENOUS | Status: AC | PRN
  Administered 2014-07-15: 500 [IU]
  Filled 2014-07-15: qty 5

## 2014-07-15 MED ORDER — FENTANYL 25 MCG/HR TD PT72: 25.0000 ug | MEDICATED_PATCH | TRANSDERMAL | Status: DC

## 2014-07-15 MED ORDER — SODIUM CHLORIDE 0.9 % IV SOLN
10.0000 mg/kg | Freq: Once | INTRAVENOUS | Status: AC
  Administered 2014-07-15: 750 mg via INTRAVENOUS
  Filled 2014-07-15: qty 30

## 2014-07-15 MED ORDER — SODIUM CHLORIDE 0.9 % IV SOLN
Freq: Once | INTRAVENOUS | Status: AC
  Administered 2014-07-15: 15:00:00 via INTRAVENOUS
  Filled 2014-07-15: qty 1000

## 2014-07-15 MED ORDER — MIRTAZAPINE 15 MG PO TABS: 15.0000 mg | ORAL_TABLET | Freq: Every day | ORAL | Status: DC

## 2014-07-15 NOTE — Progress Notes (Signed)
[place] @ [facility] Telephone:([phone]  Fax:(336) [place]: Sep 14, 1955  MR#: [id number]  JME#:[id number]  Patient Care Team: Ashok Norris, MD as PCP - General (Family Medicine)  CHIEF COMPLAINT:  Chief Complaint  Patient presents with  . 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 abdominal paracentesis x1 negative for malignant cells(22nd September)  2.PET scan shows extensive disease with lymph node metastases and bone metastases T1 N0 M1 disease biopsy is consistent with adenocarcinoma GYN or lesion.  Most likely from the endometrial cancer.  Estrogen receptor positive.  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 abdominal paracentesis x1 negative for malignant cells(22nd September)  2.PET scan shows extensive disease with lymph node metastases and bone metastases T1 N0 M1 disease biopsy is consistent with adenocarcinoma GYN or lesion.  Most likely from the endometrial cancer.  Estrogen receptor positive.  HER-2/neu receptor pending(October, 2015)  3.patient started on chemotherapy with carboplatinum and Taxolfrom November 06, 2013 4.acute pulmonary embolism February of 2016 on xeralto. 5.  Because of insurance not paying for [place] patient has been switched over to Coumadin (May,  2016) 6.  Now on maintenance a Avastin (April, 2016)     Primary cancer of endometrium   10/23/2012 Initial Diagnosis Primary cancer of endometrium    Endometrial cancer   05/24/2014 Initial Diagnosis Endometrial cancer    Oncology Flowsheet 06/03/2014 06/24/2014 07/15/2014  bevacizumab (AVASTIN) IV 10 mg/kg 10 mg/kg 10 mg/kg    INTERVAL HISTORY: Extremely-year-old lady with stage IV carcinoma of endometrium and has finished total 8cycles of chemotherapy with carboplatinum Taxol and Avastin.  Now on maintenance a Avastin therapy.  Abdominal pain has improved.  Patient is less depressed.  Celexa was increased to 40 mg daily. Here for further follow-up and treatment consideration Because of abdominal pain is improving with decrease fentanyl patch to 50 g. Appetite is improving.  No nausea.  No vomiting.  No diarrhea. July 15, 2014 Patient is here for ongoing evaluation.  Abdominal pain has improved.  Patient continues to a problem with insomnia.  Patient is also somewhat depressed in spite of taking Celexa 40 mg by mouth daily.  No nausea.  No vomiting.  No shortness of breath.  Patient is on Coumadin.  Also has some joint pains particularly in both knees.  No abdominal pain no nausea.  No abdominal distention. Tumor markers are slowly declining REVIEW OF SYSTEMS:   Gen. status: In general status is improved.  Patient is less depressed. HEENT: Alopecia.  No soreness in the mouth. Lymphatic system: No abnormality. Lungs: Shortness of breath on exertion. Cardiac: No chest pain.  No palpitation GI: No nausea no vomiting.  I  abdominal pain is improved.  No nausea.  No vomiting.  Appetite is improving.  Lower extremity no swelling. Skin: No rash or ecchymosis Neurological system: No headache no dizziness Psychiatric system: Patient is much more cheerful less depressed  As per HPI. Otherwise, a complete review of systems is negatve.  PAST MEDICAL HISTORY: Past Medical History  Diagnosis  Date  . Cancer   . Primary cancer of endometrium 05/24/2014    Significant History/PMH:   Stage 4 peritoneal Cancer:    Cirrhosis:    GERD:    endometerial cancer:    Arthritis:    Hypertension:    Migraines:    Hysterectomy:    Cholecystectomy:    Dilation and Curretage: with hysteroscopy, Aug 2012 FAMILY HISTORY Family History  Problem Relation Age of Onset  . Breast cancer Sister 39    GYNECOLOGIC HISTORY:  No LMP recorded. Patient has had a hysterectomy.     ADVANCED DIRECTIVES:does have advanced healthcare directive   HEALTH MAINTENANCE: History  Substance Use Topics  . Smoking status: Never Smoker   . Smokeless tobacco: Not on file  . Alcohol Use: Not on file    :  Allergies  Allergen Reactions  . Sulfa Antibiotics Anaphylaxis    Current Outpatient Prescriptions  Medication Sig Dispense Refill  . atenolol (TENORMIN) 25 MG tablet Take by mouth.    . citalopram (CELEXA) 40 MG tablet Take 1 tablet (40 mg total) by mouth daily. 30 tablet 2  . [facility] SENNA PLUS 8.6-50 MG per tablet Take 1 tablet by mouth 2 (two) times daily.  3  . famotidine (PEPCID) 20 MG tablet Take 20 mg by mouth 2 (two) times daily.    Marland Kitchen lidocaine-prilocaine (EMLA) cream Apply 1 application topically as needed. 30 g 3  . loratadine (CLARITIN) 10 MG tablet Take 10 mg by mouth daily.    . Multiple Vitamin (MULTIVITAMIN) capsule Take by mouth.    . ondansetron (ZOFRAN) 4 MG tablet Take 4 mg by mouth every 6 (six) hours as needed.  3  . oxyCODONE (ROXICODONE) 15 MG immediate release tablet   0  . psyllium (METAMUCIL) 58.6 % packet Take 1 packet by mouth daily.    Marland Kitchen warfarin (COUMADIN) 5 MG tablet 7.5 mg on Monday, Wednesday and Friday.  5 mg on Tuesday, Thursday, Saturday and Sunday 45 tablet 1  . cyclobenzaprine (FLEXERIL) 5 MG tablet TAKE 1 TABLET (5 MG TOTAL) BY MOUTH 3 (THREE) TIMES DAILY AS NEEDED FOR MUSCLE SPASMS.  1  . fentaNYL (DURAGESIC - DOSED MCG/HR) 25 MCG/HR patch Place 1  patch (25 mcg total) onto the skin every 3 (three) days. Use with 10mg patch for total dose of 325m. 10 patch 0  . fentaNYL (DURAGESIC - DOSED MCG/HR) 50 MCG/HR PLACE 1 PATCH ONTO SKIN EVERY 3 DAYS  0  . mirtazapine (REMERON) 15 MG tablet Take 1 tablet (15 mg total) by mouth at bedtime. 30 tablet 3   No current facility-administered medications for this visit.    OBJECTIVE:  Filed Vitals:   07/15/14 1409  BP: 126/85  Pulse: 85  Temp: 96.2 F (35.7 C)     Body mass index is 29.73 kg/(m^2).    ECOG FS:1 - Symptomatic but completely ambulatory  PHYSICAL EXAM: GENERAL:  Well developed, well nourished, sitting comfortably in the exam room in no acute distress. MENTAL STATUS:  Alert and oriented to person, place and time. HEAD:  Alopecia.  Normocephalic, atraumatic, face symmetric, no Cushingoid features. EYES: pale  Pupils equal round and reactive to light and accomodation.  No conjunctivitis or scleral icterus. ENT:  Oropharynx clear without lesion.  Tongue normal. Mucous membranes moist.  RESPIRATORY:  Clear to auscultation without rales, wheezes or rhonchi. CARDIOVASCULAR:  Regular rate and rhythm without murmur, rub or gallop. . ABDOMEN:  Soft, non-tender, with active bowel sounds, and no hepatosplenomegaly.  No masses. Tenderness in lower abdominal area.  No palpable masses. BACK:  No CVA tenderness.  No tenderness on percussion of the back or rib cage. SKIN:  No rashes, ulcers or lesions. EXTREMITIES: No edema, no skin discoloration or tenderness.  No palpable cords. LYMPH NODES: No palpable cervical, supraclavicular, axillary or inguinal adenopathy  NEUROLOGICAL: Unremarkable. PSYCH:  Less depressed LAB RESULTS:  Appointment on 07/15/2014  Component Date Value Ref Range Status  . WBC 07/15/2014 5.7  3.6 - 11.0 K/uL Final   A-LINE DRAW  . RBC 07/15/2014 4.59  3.80 - 5.20 MIL/uL Final  . Hemoglobin 07/15/2014 13.7  12.0 - 16.0 g/dL Final  . HCT 07/15/2014 42.3  35.0 - 47.0  % Final  . MCV 07/15/2014 92.2  80.0 - 100.0 fL Final  . MCH 07/15/2014 29.9  26.0 - 34.0 pg Final  . MCHC 07/15/2014 32.4  32.0 - 36.0 g/dL Final  . RDW 07/15/2014 15.3* 11.5 - 14.5 % Final  . Platelets 07/15/2014 215  150 - 440 K/uL Final  . Neutrophils Relative % 07/15/2014 63   Final  . Neutro Abs 07/15/2014 3.5  1.4 - 6.5 K/uL Final  . Lymphocytes Relative 07/15/2014 29   Final  . Lymphs Abs 07/15/2014 1.7  1.0 - 3.6 K/uL Final  . Monocytes Relative 07/15/2014 6   Final  . Monocytes Absolute 07/15/2014 0.4  0.2 - 0.9 K/uL Final  . Eosinophils Relative 07/15/2014 2   Final  . Eosinophils Absolute 07/15/2014 0.1  0 - 0.7 K/uL Final  . Basophils Relative 07/15/2014 0   Final  . Basophils Absolute 07/15/2014 0.0  0 - 0.1 K/uL Final  . Sodium 07/15/2014 136  135 - 145 mmol/L Final  . Potassium 07/15/2014 3.8  3.5 - 5.1 mmol/L Final  . Chloride 07/15/2014 102  101 - 111 mmol/L Final  . CO2 07/15/2014 27  22 - 32 mmol/L Final  . Glucose, Bld 07/15/2014 161* 65 - 99 mg/dL Final  . BUN 07/15/2014 17  6 - 20 mg/dL Final  . Creatinine, Ser 07/15/2014 1.19* 0.44 - 1.00 mg/dL Final  . Calcium 07/15/2014 8.7* 8.9 - 10.3 mg/dL Final  . Total Protein 07/15/2014 7.3  6.5 - 8.1 g/dL Final  . Albumin 07/15/2014 4.2  3.5 - 5.0 g/dL Final  . AST 07/15/2014 31  15 - 41 U/L Final  . ALT 07/15/2014 25  14 - 54 U/L Final  . Alkaline Phosphatase 07/15/2014 69  38 - 126 U/L Final  . Total Bilirubin 07/15/2014 0.8  0.3 - 1.2 mg/dL Final  . GFR calc non Af Amer 07/15/2014 48* >60 mL/min Final  . GFR calc Af Amer 07/15/2014 56* >60 mL/min Final   Comment: (NOTE) The eGFR has been calculated using the CKD EPI equation. This calculation has not been validated in all clinical situations. eGFR's persistently <60 mL/min signify possible Chronic Kidney Disease.   . Anion gap 07/15/2014 7  5 - 15 Final  . Prothrombin Time 07/15/2014 27.3* 11.4 - 15.0 seconds Final  . INR 07/15/2014 2.52   Final     Lab  Results  Component Value  Date   CA125 31.7 06/24/2014   No results found for: CEA  Lab Results  Component Value Date   CA125 31.7 06/24/2014   STUDIES: PET scan which was done in May of 2016 has been reviewed with the patient. 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  Now on Coumadin as insurance does not approve XERALTO INR is therapeutic no change in the dose of Coumadin PET scan has been reviewed independently. MEDICAL DECISION MAKING:  We will continue Avastin at present time. Difficulty in sleeping, depression in spite of patient taking Celexa 40 mg by mouth daily I offered patient psych evaluation and she is in agreement for that. Tumor markers are pending We will gradually taper off fentanyl patch to 37 g and later on 225 g if there is no significant abdominal pain.  Patient was warned that some of the muscle pain and joint pains may return when we are trying to decrease fentanyl patch   Patient expressed understanding and was in agreement with this plan. She also understands that She can call clinic at any time with any questions, concerns, or complaints.    Primary cancer of endometrium   Staging form: Corpus Uteri - Carcinoma, AJCC 7th Edition     Clinical: Stage IVB (T1a, N0, M1) - Signed by Forest Gleason, MD on 05/24/2014     Pathologic: No stage assigned - Marni Griffon, MD   07/15/2014 9:16 PM

## 2014-07-15 NOTE — Progress Notes (Signed)
Patient does have living will. Never smoked. 

## 2014-07-16 LAB — CA 125: CA 125: 29.8 U/mL (ref 0.0–38.1)

## 2014-07-16 LAB — CEA: CEA: 3.5 ng/mL (ref 0.0–4.7)

## 2014-08-03 ENCOUNTER — Other Ambulatory Visit: Payer: Self-pay | Admitting: Oncology

## 2014-08-05 ENCOUNTER — Inpatient Hospital Stay: Payer: No Typology Code available for payment source | Attending: Oncology

## 2014-08-05 ENCOUNTER — Inpatient Hospital Stay: Payer: No Typology Code available for payment source

## 2014-08-05 ENCOUNTER — Encounter: Payer: Self-pay | Admitting: Oncology

## 2014-08-05 ENCOUNTER — Other Ambulatory Visit: Payer: Self-pay | Admitting: *Deleted

## 2014-08-05 ENCOUNTER — Inpatient Hospital Stay (HOSPITAL_BASED_OUTPATIENT_CLINIC_OR_DEPARTMENT_OTHER): Payer: No Typology Code available for payment source | Admitting: Oncology

## 2014-08-05 VITALS — BP 108/78 | HR 80 | Temp 97.8°F | Resp 20 | Ht 64.0 in | Wt 174.5 lb

## 2014-08-05 DIAGNOSIS — Z9049 Acquired absence of other specified parts of digestive tract: Secondary | ICD-10-CM | POA: Insufficient documentation

## 2014-08-05 DIAGNOSIS — G8929 Other chronic pain: Secondary | ICD-10-CM | POA: Diagnosis not present

## 2014-08-05 DIAGNOSIS — C779 Secondary and unspecified malignant neoplasm of lymph node, unspecified: Secondary | ICD-10-CM | POA: Diagnosis not present

## 2014-08-05 DIAGNOSIS — M25561 Pain in right knee: Secondary | ICD-10-CM | POA: Diagnosis not present

## 2014-08-05 DIAGNOSIS — Z5111 Encounter for antineoplastic chemotherapy: Secondary | ICD-10-CM | POA: Diagnosis not present

## 2014-08-05 DIAGNOSIS — K219 Gastro-esophageal reflux disease without esophagitis: Secondary | ICD-10-CM | POA: Insufficient documentation

## 2014-08-05 DIAGNOSIS — Z90710 Acquired absence of both cervix and uterus: Secondary | ICD-10-CM | POA: Diagnosis not present

## 2014-08-05 DIAGNOSIS — Z86711 Personal history of pulmonary embolism: Secondary | ICD-10-CM

## 2014-08-05 DIAGNOSIS — C7951 Secondary malignant neoplasm of bone: Secondary | ICD-10-CM | POA: Insufficient documentation

## 2014-08-05 DIAGNOSIS — Z5181 Encounter for therapeutic drug level monitoring: Secondary | ICD-10-CM

## 2014-08-05 DIAGNOSIS — C541 Malignant neoplasm of endometrium: Secondary | ICD-10-CM

## 2014-08-05 DIAGNOSIS — K7460 Unspecified cirrhosis of liver: Secondary | ICD-10-CM | POA: Insufficient documentation

## 2014-08-05 DIAGNOSIS — R188 Other ascites: Secondary | ICD-10-CM | POA: Insufficient documentation

## 2014-08-05 DIAGNOSIS — I10 Essential (primary) hypertension: Secondary | ICD-10-CM | POA: Diagnosis not present

## 2014-08-05 DIAGNOSIS — F329 Major depressive disorder, single episode, unspecified: Secondary | ICD-10-CM | POA: Diagnosis not present

## 2014-08-05 DIAGNOSIS — Z79899 Other long term (current) drug therapy: Secondary | ICD-10-CM

## 2014-08-05 DIAGNOSIS — Z170 Estrogen receptor positive status [ER+]: Secondary | ICD-10-CM | POA: Diagnosis not present

## 2014-08-05 DIAGNOSIS — R970 Elevated carcinoembryonic antigen [CEA]: Secondary | ICD-10-CM | POA: Insufficient documentation

## 2014-08-05 DIAGNOSIS — Z86010 Personal history of colonic polyps: Secondary | ICD-10-CM | POA: Diagnosis not present

## 2014-08-05 DIAGNOSIS — M129 Arthropathy, unspecified: Secondary | ICD-10-CM | POA: Insufficient documentation

## 2014-08-05 DIAGNOSIS — Z7901 Long term (current) use of anticoagulants: Secondary | ICD-10-CM | POA: Insufficient documentation

## 2014-08-05 DIAGNOSIS — Z8669 Personal history of other diseases of the nervous system and sense organs: Secondary | ICD-10-CM | POA: Diagnosis not present

## 2014-08-05 DIAGNOSIS — R971 Elevated cancer antigen 125 [CA 125]: Secondary | ICD-10-CM | POA: Insufficient documentation

## 2014-08-05 DIAGNOSIS — R109 Unspecified abdominal pain: Secondary | ICD-10-CM | POA: Diagnosis not present

## 2014-08-05 DIAGNOSIS — Z803 Family history of malignant neoplasm of breast: Secondary | ICD-10-CM | POA: Insufficient documentation

## 2014-08-05 LAB — CBC WITH DIFFERENTIAL/PLATELET
BASOS ABS: 0 10*3/uL (ref 0–0.1)
BASOS PCT: 1 %
Eosinophils Absolute: 0.1 10*3/uL (ref 0–0.7)
Eosinophils Relative: 3 %
HEMATOCRIT: 39.2 % (ref 35.0–47.0)
HEMOGLOBIN: 13 g/dL (ref 12.0–16.0)
LYMPHS PCT: 34 %
Lymphs Abs: 1.4 10*3/uL (ref 1.0–3.6)
MCH: 30 pg (ref 26.0–34.0)
MCHC: 33.1 g/dL (ref 32.0–36.0)
MCV: 90.7 fL (ref 80.0–100.0)
MONOS PCT: 8 %
Monocytes Absolute: 0.3 10*3/uL (ref 0.2–0.9)
NEUTROS PCT: 54 %
Neutro Abs: 2.2 10*3/uL (ref 1.4–6.5)
Platelets: 244 10*3/uL (ref 150–440)
RBC: 4.32 MIL/uL (ref 3.80–5.20)
RDW: 15.4 % — ABNORMAL HIGH (ref 11.5–14.5)
WBC: 4.1 10*3/uL (ref 3.6–11.0)

## 2014-08-05 LAB — COMPREHENSIVE METABOLIC PANEL
ALT: 20 U/L (ref 14–54)
AST: 27 U/L (ref 15–41)
Albumin: 3.8 g/dL (ref 3.5–5.0)
Alkaline Phosphatase: 66 U/L (ref 38–126)
Anion gap: 6 (ref 5–15)
BUN: 13 mg/dL (ref 6–20)
CO2: 27 mmol/L (ref 22–32)
Calcium: 8.6 mg/dL — ABNORMAL LOW (ref 8.9–10.3)
Chloride: 105 mmol/L (ref 101–111)
Creatinine, Ser: 1.15 mg/dL — ABNORMAL HIGH (ref 0.44–1.00)
GFR calc Af Amer: 58 mL/min — ABNORMAL LOW (ref >60)
GFR calc non Af Amer: 50 mL/min — ABNORMAL LOW (ref >60)
Glucose, Bld: 129 mg/dL — ABNORMAL HIGH (ref 65–99)
Potassium: 3.7 mmol/L (ref 3.5–5.1)
Sodium: 138 mmol/L (ref 135–145)
Total Bilirubin: 0.8 mg/dL (ref 0.3–1.2)
Total Protein: 7 g/dL (ref 6.5–8.1)

## 2014-08-05 MED ORDER — SODIUM CHLORIDE 0.9 % IJ SOLN
10.0000 mL | Freq: Once | INTRAMUSCULAR | Status: AC
Start: 2014-08-05 — End: 2014-08-05
  Administered 2014-08-05: 10 mL via INTRAVENOUS
  Filled 2014-08-05: qty 10

## 2014-08-05 MED ORDER — SODIUM CHLORIDE 0.9 % IJ SOLN
10.0000 mL | INTRAMUSCULAR | Status: DC | PRN
  Filled 2014-08-05: qty 10

## 2014-08-05 MED ORDER — SODIUM CHLORIDE 0.9 % IJ SOLN
10.0000 mL | Freq: Once | INTRAMUSCULAR | Status: AC
  Administered 2014-08-05: 10 mL via INTRAVENOUS
  Filled 2014-08-05: qty 10

## 2014-08-05 MED ORDER — HEPARIN SOD (PORK) LOCK FLUSH 100 UNIT/ML IV SOLN
500.0000 [IU] | Freq: Once | INTRAVENOUS | Status: AC
  Administered 2014-08-05: 500 [IU] via INTRAVENOUS

## 2014-08-05 MED ORDER — HEPARIN SOD (PORK) LOCK FLUSH 100 UNIT/ML IV SOLN
500.0000 [IU] | Freq: Once | INTRAVENOUS | Status: DC | PRN
  Filled 2014-08-05: qty 5

## 2014-08-05 MED ORDER — SODIUM CHLORIDE 0.9 % IV SOLN
Freq: Once | INTRAVENOUS | Status: AC
Start: 2014-08-05 — End: 2014-08-05
  Administered 2014-08-05: 16:00:00 via INTRAVENOUS
  Filled 2014-08-05: qty 1000

## 2014-08-05 MED ORDER — FENTANYL 50 MCG/HR TD PT72: MEDICATED_PATCH | TRANSDERMAL | Status: DC

## 2014-08-05 MED ORDER — CITALOPRAM HYDROBROMIDE 40 MG PO TABS: 40.0000 mg | ORAL_TABLET | Freq: Every day | ORAL | Status: DC

## 2014-08-05 MED ORDER — BEVACIZUMAB CHEMO INJECTION 400 MG/16ML
10.0000 mg/kg | Freq: Once | INTRAVENOUS | Status: AC
  Administered 2014-08-05: 750 mg via INTRAVENOUS
  Filled 2014-08-05: qty 30

## 2014-08-05 NOTE — Progress Notes (Signed)
Patient has a living will; power of attorney

## 2014-08-06 ENCOUNTER — Encounter: Payer: Self-pay | Admitting: Oncology

## 2014-08-06 ENCOUNTER — Other Ambulatory Visit: Payer: Self-pay | Admitting: Oncology

## 2014-08-06 LAB — CA 125: CA 125: 25.9 U/mL (ref 0.0–38.1)

## 2014-08-06 NOTE — Progress Notes (Signed)
Emily Zamora @ [facility] [phone]  Fax:(336) [place]: 05/17/1955  MR#: [id number]  TWS#:[id number]  Patient Care Team: Ashok Norris, MD as PCP - General (Family Medicine)  CHIEF COMPLAINT:  No chief complaint on file.   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 abdominal paracentesis x1 negative for malignant cells(22nd September)  2.PET scan shows extensive disease with lymph node metastases and bone metastases T1 N0 M1 disease biopsy is consistent with adenocarcinoma GYN or lesion.  Most likely from the endometrial cancer.  Estrogen receptor positive.  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 abdominal paracentesis x1 negative for malignant cells(22nd September)  2.PET scan shows extensive disease with lymph node metastases and bone metastases T1 N0 M1 disease biopsy is consistent with adenocarcinoma GYN or lesion.  Most likely from the endometrial cancer.  Estrogen receptor positive.  HER-2/neu receptor pending(October, 2015)  3.patient started on chemotherapy with carboplatinum and Taxolfrom November 06, 2013 4.acute pulmonary embolism February of 2016 on xeralto. 5.  Because of insurance not paying for [place] patient has been switched over to Coumadin (May, 2016) 6.  Now on maintenance a  Avastin (April, 2016)     Primary cancer of endometrium   10/23/2012 Initial Diagnosis Primary cancer of endometrium    Endometrial cancer   05/24/2014 Initial Diagnosis Endometrial cancer    Oncology Flowsheet 06/03/2014 06/24/2014 07/15/2014 08/05/2014  bevacizumab (AVASTIN) IV 10 mg/kg 10 mg/kg 10 mg/kg 10 mg/kg    INTERVAL HISTORY: Extremely-year-old lady with stage IV carcinoma of endometrium and has finished total 8cycles of chemotherapy with carboplatinum Taxol and Avastin.  Now on maintenance a Avastin therapy.  Abdominal pain has improved.  Patient is less depressed.  Celexa was increased to 40 mg daily. Here for further follow-up and treatment consideration Because of abdominal pain is improving with decrease fentanyl patch to 50 g. Appetite is improving.  No nausea.  No vomiting.  No diarrhea. July 15, 2014 Patient is here for ongoing evaluation.  Abdominal pain has improved.  Patient continues to a problem with insomnia.  Patient is also somewhat depressed in spite of taking Celexa 40 mg by mouth daily.  No nausea.  No vomiting.  No shortness of breath.  Patient is on Coumadin.  Also has some joint pains particularly in both knees.  No abdominal pain no nausea.  No abdominal distention. Tumor markers are slowly declining August 2nd,  2016 Patient is here for ongoing evaluation and treatment consideration. Chills and fever.  No nausea or vomiting.  No abdominal pain Patient has some arthritis pain in both knee.  Taking Coumadin on a regular basis  REVIEW OF SYSTEMS:   Gen. status: In general status  is improved.  Patient is less depressed. HEENT: Alopecia.  No soreness in the mouth. Lymphatic system: No abnormality. Lungs: Shortness of breath on exertion. Cardiac: No chest pain.  No palpitation GI: No nausea no vomiting.  I abdominal pain is improved.  No nausea.  No vomiting.  Appetite is improving.  Lower extremity no swelling. Skin: No rash or ecchymosis Neurological  system: No headache no dizziness Psychiatric system: Patient is much more cheerful less depressed  As per HPI. Otherwise, a complete review of systems is negatve.  PAST MEDICAL HISTORY: Past Medical History  Diagnosis Date  . Cancer   . Primary cancer of endometrium 05/24/2014    Significant History/PMH:   Stage 4 peritoneal Cancer:    Cirrhosis:    GERD:    endometerial cancer:    Arthritis:    Hypertension:    Migraines:    Hysterectomy:    Cholecystectomy:    Dilation and Curretage: with hysteroscopy, Aug 2012 FAMILY HISTORY Family History  Problem Relation Age of Onset  . Breast cancer Sister 64    GYNECOLOGIC HISTORY:  No LMP recorded. Patient has had a hysterectomy.     ADVANCED DIRECTIVES:does have advanced healthcare directive   HEALTH MAINTENANCE: History  Substance Use Topics  . Smoking status: Never Smoker   . Smokeless tobacco: Not on file  . Alcohol Use: Not on file    :  Allergies  Allergen Reactions  . Sulfa Antibiotics Anaphylaxis    Current Outpatient Prescriptions  Medication Sig Dispense Refill  . atenolol (TENORMIN) 25 MG tablet Take by mouth.    . famotidine (PEPCID) 20 MG tablet Take 20 mg by mouth 2 (two) times daily.    Marland Kitchen lidocaine-prilocaine (EMLA) cream Apply 1 application topically as needed. 30 g 3  . loratadine (CLARITIN) 10 MG tablet Take 10 mg by mouth daily.    . mirtazapine (REMERON) 15 MG tablet Take 1 tablet (15 mg total) by mouth at bedtime. 30 tablet 3  . Multiple Vitamin (MULTIVITAMIN) capsule Take by mouth.    . ondansetron (ZOFRAN) 4 MG tablet Take 4 mg by mouth every 6 (six) hours as needed.  3  . oxyCODONE (ROXICODONE) 15 MG immediate release tablet   0  . psyllium (METAMUCIL) 58.6 % packet Take 1 packet by mouth daily.    Marland Kitchen warfarin (COUMADIN) 5 MG tablet TAKE 1 AND 1/2 TABLETS ON MON, WED AND FRIDAY AND 1 TABLET ON TUES, THURS, SATURDAY AND SUNDAY 45 tablet 1  . citalopram (CELEXA) 40 MG tablet Take 1  tablet (40 mg total) by mouth daily. 30 tablet 2  . [employer] SENNA PLUS 8.6-50 MG per tablet Take 1 tablet by mouth 2 (two) times daily.  3  . cyclobenzaprine (FLEXERIL) 5 MG tablet TAKE 1 TABLET (5 MG TOTAL) BY MOUTH 3 (THREE) TIMES DAILY AS NEEDED FOR MUSCLE SPASMS.  1  . fentaNYL (DURAGESIC - DOSED MCG/HR) 25 MCG/HR patch Place 1 patch (25 mcg total) onto the skin every 3 (three) days. Use with 44mg patch for total dose of 321m. (Patient not taking: Reported on 08/05/2014) 10 patch 0  . fentaNYL (DURAGESIC - DOSED MCG/HR) 50 MCG/HR PLACE 1 PATCH ONTO SKIN EVERY 3 DAYS 5 patch 0   No current facility-administered medications for this visit.    OBJECTIVE:  Filed Vitals:   08/05/14 1421  BP: 108/78  Pulse: 80  Temp: 97.8 F (36.6 C)  Resp: 20     Body mass index is 29.94 kg/(m^2).  ECOG FS:1 - Symptomatic but completely ambulatory  PHYSICAL EXAM: GENERAL:  Well developed, well nourished, sitting comfortably in the exam room in no acute distress. MENTAL STATUS:  Alert and oriented to person, place and time. HEAD:  Alopecia.  Normocephalic, atraumatic, face symmetric, no Cushingoid features. EYES: pale  Pupils equal round and reactive to light and accomodation.  No conjunctivitis or scleral icterus. ENT:  Oropharynx clear without lesion.  Tongue normal. Mucous membranes moist.  RESPIRATORY:  Clear to auscultation without rales, wheezes or rhonchi. CARDIOVASCULAR:  Regular rate and rhythm without murmur, rub or gallop. . ABDOMEN:  Soft, non-tender, with active bowel sounds, and no hepatosplenomegaly.  No masses. Tenderness in lower abdominal area.  No palpable masses. BACK:  No CVA tenderness.  No tenderness on percussion of the back or rib cage. SKIN:  No rashes, ulcers or lesions. EXTREMITIES: No edema, no skin discoloration or tenderness.  No palpable cords. LYMPH NODES: No palpable cervical, supraclavicular, axillary or inguinal adenopathy  NEUROLOGICAL: Unremarkable. PSYCH:  Less  depressed LAB RESULTS:  Infusion on 08/05/2014  Component Date Value Ref Range Status  . WBC 08/05/2014 4.1  3.6 - 11.0 K/uL Final   A-LINE DRAW  . RBC 08/05/2014 4.32  3.80 - 5.20 MIL/uL Final  . Hemoglobin 08/05/2014 13.0  12.0 - 16.0 g/dL Final  . HCT 08/05/2014 39.2  35.0 - 47.0 % Final  . MCV 08/05/2014 90.7  80.0 - 100.0 fL Final  . MCH 08/05/2014 30.0  26.0 - 34.0 pg Final  . MCHC 08/05/2014 33.1  32.0 - 36.0 g/dL Final  . RDW 08/05/2014 15.4* 11.5 - 14.5 % Final  . Platelets 08/05/2014 244  150 - 440 K/uL Final  . Neutrophils Relative % 08/05/2014 54   Final  . Neutro Abs 08/05/2014 2.2  1.4 - 6.5 K/uL Final  . Lymphocytes Relative 08/05/2014 34   Final  . Lymphs Abs 08/05/2014 1.4  1.0 - 3.6 K/uL Final  . Monocytes Relative 08/05/2014 8   Final  . Monocytes Absolute 08/05/2014 0.3  0.2 - 0.9 K/uL Final  . Eosinophils Relative 08/05/2014 3   Final  . Eosinophils Absolute 08/05/2014 0.1  0 - 0.7 K/uL Final  . Basophils Relative 08/05/2014 1   Final  . Basophils Absolute 08/05/2014 0.0  0 - 0.1 K/uL Final  . Sodium 08/05/2014 138  135 - 145 mmol/L Final  . Potassium 08/05/2014 3.7  3.5 - 5.1 mmol/L Final  . Chloride 08/05/2014 105  101 - 111 mmol/L Final  . CO2 08/05/2014 27  22 - 32 mmol/L Final  . Glucose, Bld 08/05/2014 129* 65 - 99 mg/dL Final  . BUN 08/05/2014 13  6 - 20 mg/dL Final  . Creatinine, Ser 08/05/2014 1.15* 0.44 - 1.00 mg/dL Final  . Calcium 08/05/2014 8.6* 8.9 - 10.3 mg/dL Final  . Total Protein 08/05/2014 7.0  6.5 - 8.1 g/dL Final  . Albumin 08/05/2014 3.8  3.5 - 5.0 g/dL Final  . AST 08/05/2014 27  15 - 41 U/L Final  . ALT 08/05/2014 20  14 - 54 U/L Final  . Alkaline Phosphatase 08/05/2014 66  38 - 126 U/L Final  . Total Bilirubin 08/05/2014 0.8  0.3 - 1.2 mg/dL Final  . GFR calc non Af Amer 08/05/2014 50* >60 mL/min Final  . GFR calc Af Amer 08/05/2014 58* >60 mL/min Final   Comment: (NOTE) The eGFR has been calculated using the CKD EPI  equation. This calculation has not been validated in all  clinical situations. eGFR's persistently <60 mL/min signify possible Chronic Kidney Disease.   . Anion gap 08/05/2014 6  5 - 15 Final     Lab Results  Component Value Date   CA125 29.8 07/15/2014   Lab Results  Component Value Date   CEA 3.5 07/15/2014    Lab Results  Component Value Date   CA125 29.8 07/15/2014   STUDIES: PET scan which was done in May of 2016 has been reviewed with the patient. 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  Patient is on Coumadin pro time will be checked.   On clinical examination there is no evidence of recurrent disease  Markers are stable  MEDICAL DECISION MAKING:  We will continue Avastin at present time. Abdominal pain is under control.  Patient is less depressed.  Patient expressed understanding and was in agreement with this plan. She also understands that She can call clinic at any time with any questions, concerns, or complaints.    Primary cancer of endometrium   Staging form: Corpus Uteri - Carcinoma, AJCC 7th Edition     Clinical: Stage IVB (T1a, N0, M1) - Signed by Forest Gleason, MD on 05/24/2014     Pathologic: No stage assigned - Marni Griffon, MD   08/06/2014 7:56 AM

## 2014-08-11 ENCOUNTER — Other Ambulatory Visit: Payer: Self-pay | Admitting: Family Medicine

## 2014-08-22 ENCOUNTER — Other Ambulatory Visit: Payer: Self-pay | Admitting: *Deleted

## 2014-08-22 DIAGNOSIS — C541 Malignant neoplasm of endometrium: Secondary | ICD-10-CM

## 2014-08-26 ENCOUNTER — Ambulatory Visit: Payer: No Typology Code available for payment source

## 2014-08-26 ENCOUNTER — Ambulatory Visit: Payer: No Typology Code available for payment source | Admitting: Oncology

## 2014-08-26 ENCOUNTER — Other Ambulatory Visit: Payer: No Typology Code available for payment source

## 2014-08-27 ENCOUNTER — Inpatient Hospital Stay (HOSPITAL_BASED_OUTPATIENT_CLINIC_OR_DEPARTMENT_OTHER): Payer: No Typology Code available for payment source | Admitting: Family Medicine

## 2014-08-27 ENCOUNTER — Inpatient Hospital Stay: Payer: No Typology Code available for payment source

## 2014-08-27 ENCOUNTER — Encounter: Payer: Self-pay | Admitting: Internal Medicine

## 2014-08-27 VITALS — BP 129/86 | HR 83 | Temp 96.3°F | Wt 176.6 lb

## 2014-08-27 VITALS — BP 122/79 | HR 76

## 2014-08-27 DIAGNOSIS — Z79899 Other long term (current) drug therapy: Secondary | ICD-10-CM

## 2014-08-27 DIAGNOSIS — Z86711 Personal history of pulmonary embolism: Secondary | ICD-10-CM

## 2014-08-27 DIAGNOSIS — C541 Malignant neoplasm of endometrium: Secondary | ICD-10-CM

## 2014-08-27 DIAGNOSIS — C779 Secondary and unspecified malignant neoplasm of lymph node, unspecified: Secondary | ICD-10-CM

## 2014-08-27 DIAGNOSIS — R188 Other ascites: Secondary | ICD-10-CM

## 2014-08-27 DIAGNOSIS — R971 Elevated cancer antigen 125 [CA 125]: Secondary | ICD-10-CM

## 2014-08-27 DIAGNOSIS — M129 Arthropathy, unspecified: Secondary | ICD-10-CM

## 2014-08-27 DIAGNOSIS — Z90710 Acquired absence of both cervix and uterus: Secondary | ICD-10-CM

## 2014-08-27 DIAGNOSIS — I10 Essential (primary) hypertension: Secondary | ICD-10-CM

## 2014-08-27 DIAGNOSIS — F329 Major depressive disorder, single episode, unspecified: Secondary | ICD-10-CM

## 2014-08-27 DIAGNOSIS — Z803 Family history of malignant neoplasm of breast: Secondary | ICD-10-CM

## 2014-08-27 DIAGNOSIS — K7460 Unspecified cirrhosis of liver: Secondary | ICD-10-CM

## 2014-08-27 DIAGNOSIS — Z170 Estrogen receptor positive status [ER+]: Secondary | ICD-10-CM

## 2014-08-27 DIAGNOSIS — G8929 Other chronic pain: Secondary | ICD-10-CM

## 2014-08-27 DIAGNOSIS — Z9049 Acquired absence of other specified parts of digestive tract: Secondary | ICD-10-CM

## 2014-08-27 DIAGNOSIS — K219 Gastro-esophageal reflux disease without esophagitis: Secondary | ICD-10-CM

## 2014-08-27 DIAGNOSIS — C7951 Secondary malignant neoplasm of bone: Secondary | ICD-10-CM

## 2014-08-27 DIAGNOSIS — R109 Unspecified abdominal pain: Secondary | ICD-10-CM

## 2014-08-27 DIAGNOSIS — Z8669 Personal history of other diseases of the nervous system and sense organs: Secondary | ICD-10-CM

## 2014-08-27 DIAGNOSIS — Z7901 Long term (current) use of anticoagulants: Secondary | ICD-10-CM

## 2014-08-27 DIAGNOSIS — Z86010 Personal history of colonic polyps: Secondary | ICD-10-CM

## 2014-08-27 DIAGNOSIS — R970 Elevated carcinoembryonic antigen [CEA]: Secondary | ICD-10-CM

## 2014-08-27 DIAGNOSIS — M25561 Pain in right knee: Secondary | ICD-10-CM | POA: Insufficient documentation

## 2014-08-27 LAB — COMPREHENSIVE METABOLIC PANEL
ALK PHOS: 60 U/L (ref 38–126)
ALT: 16 U/L (ref 14–54)
ANION GAP: 4 — AB (ref 5–15)
AST: 22 U/L (ref 15–41)
Albumin: 3.7 g/dL (ref 3.5–5.0)
BUN: 16 mg/dL (ref 6–20)
CALCIUM: 8.3 mg/dL — AB (ref 8.9–10.3)
CHLORIDE: 108 mmol/L (ref 101–111)
CO2: 26 mmol/L (ref 22–32)
CREATININE: 1.12 mg/dL — AB (ref 0.44–1.00)
GFR, EST NON AFRICAN AMERICAN: 52 mL/min — AB (ref >60)
Glucose, Bld: 139 mg/dL — ABNORMAL HIGH (ref 65–99)
Potassium: 3.5 mmol/L (ref 3.5–5.1)
SODIUM: 138 mmol/L (ref 135–145)
Total Bilirubin: 0.5 mg/dL (ref 0.3–1.2)
Total Protein: 6.5 g/dL (ref 6.5–8.1)

## 2014-08-27 LAB — CBC WITH DIFFERENTIAL/PLATELET
Basophils Absolute: 0 10*3/uL (ref 0–0.1)
Basophils Relative: 1 %
EOS ABS: 0.2 10*3/uL (ref 0–0.7)
EOS PCT: 4 %
HCT: 39.9 % (ref 35.0–47.0)
Hemoglobin: 13.2 g/dL (ref 12.0–16.0)
LYMPHS ABS: 2.1 10*3/uL (ref 1.0–3.6)
LYMPHS PCT: 44 %
MCH: 29.2 pg (ref 26.0–34.0)
MCHC: 33 g/dL (ref 32.0–36.0)
MCV: 88.5 fL (ref 80.0–100.0)
MONOS PCT: 6 %
Monocytes Absolute: 0.3 10*3/uL (ref 0.2–0.9)
Neutro Abs: 2.2 10*3/uL (ref 1.4–6.5)
Neutrophils Relative %: 45 %
PLATELETS: 195 10*3/uL (ref 150–440)
RBC: 4.52 MIL/uL (ref 3.80–5.20)
RDW: 15.1 % — ABNORMAL HIGH (ref 11.5–14.5)
WBC: 4.8 10*3/uL (ref 3.6–11.0)

## 2014-08-27 LAB — PROTIME-INR
INR: 2.78
PROTHROMBIN TIME: 29.4 s — AB (ref 11.4–15.0)

## 2014-08-27 MED ORDER — SODIUM CHLORIDE 0.9 % IV SOLN
Freq: Once | INTRAVENOUS | Status: AC
  Administered 2014-08-27: 12:00:00 via INTRAVENOUS
  Filled 2014-08-27: qty 1000

## 2014-08-27 MED ORDER — SODIUM CHLORIDE 0.9 % IJ SOLN
10.0000 mL | INTRAMUSCULAR | Status: DC | PRN
  Administered 2014-08-27: 10 mL via INTRAVENOUS
  Filled 2014-08-27: qty 10

## 2014-08-27 MED ORDER — FENTANYL 50 MCG/HR TD PT72: MEDICATED_PATCH | TRANSDERMAL | Status: DC

## 2014-08-27 MED ORDER — HEPARIN SOD (PORK) LOCK FLUSH 100 UNIT/ML IV SOLN
500.0000 [IU] | Freq: Once | INTRAVENOUS | Status: AC
  Administered 2014-08-27: 500 [IU] via INTRAVENOUS

## 2014-08-27 MED ORDER — HEPARIN SOD (PORK) LOCK FLUSH 100 UNIT/ML IV SOLN
500.0000 [IU] | Freq: Once | INTRAVENOUS | Status: DC | PRN
  Filled 2014-08-27: qty 5

## 2014-08-27 MED ORDER — BEVACIZUMAB CHEMO INJECTION 400 MG/16ML
10.0000 mg/kg | Freq: Once | INTRAVENOUS | Status: AC
  Administered 2014-08-27: 750 mg via INTRAVENOUS
  Filled 2014-08-27: qty 30

## 2014-08-27 NOTE — Progress Notes (Signed)
Patient here today for follow up.  States she has joint pain in her knees when she first stands.  Some days it is more intense than others.

## 2014-08-27 NOTE — Progress Notes (Signed)
[place]  Telephone:(336) [phone]  Fax:(336) [phone]     Emily Zamora DOB: 1955-02-13  MR#: [id number]  CZY#:[id number]  Patient Care Team: Ashok Norris, MD as PCP - General (Family Medicine)  CHIEF COMPLAINT:  Chief Complaint  Patient presents with  . Follow-up   A she is due for chemotherapy for treatment of endometrial adenocarcinoma with bone metastases.   INTERVAL H ISTORY:  Patient is here for follow-up and treatment consideration regarding metastatic endometrial adenocarcinoma. She is currently on maintenance Avastin as of April 2016. She reports overall feeling very well other than some chronic right knee pain. She reports knee pain is related to arthritis, she has never seen an orthopedic before. Overall she is feeling very well and denies any other complaints.   REVIEW OF SYSTEMS:   Review of Systems  Constitutional: Negative for fever, chills, weight loss, malaise/fatigue and diaphoresis.  HENT: Negative for congestion, ear discharge, ear pain, hearing loss, nosebleeds, sore throat and tinnitus.   Eyes: Negative for blurred vision, double vision, photophobia, pain, discharge and redness.  Respiratory: Negative for cough, hemoptysis, sputum production, shortness of breath, wheezing and stridor.   Cardiovascular: Negative for chest pain, palpitations, orthopnea, claudication, leg swelling and PND.  Gastrointestinal: Negative for heartburn, nausea, vomiting, abdominal pain, diarrhea, constipation, blood in stool and melena.  Genitourinary: Negative.   Musculoskeletal: Positive for joint pain.       Right knee  Skin: Negative.   Neurological: Negative for dizziness, tingling, focal weakness, seizures, weakness and headaches.  Endo/Heme/Allergies: Does not bruise/bleed easily.  Psychiatric/Behavioral: Negative for depression. The patient is not nervous/anxious and does not have insomnia.     As per HPI. Otherwise, a complete review of systems is  negatve.  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 abdominal paracentesis x1 negative for malignant cells(22nd September)  2.PET scan shows extensive disease with lymph node metastases and bone metastases T1 N0 M1 disease biopsy is consistent with adenocarcinoma GYN or lesion.  Most likely from the endometrial cancer.  Estrogen receptor positive.  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 abdominal paracentesis x1 negative for malignant cells(22nd September)  2.PET scan shows extensive disease with lymph node metastases and bone metastases T1 N0 M1 disease biopsy is consistent with adenocarcinoma GYN or lesion.  Most likely from the endometrial cancer.  Estrogen receptor positive.  HER-2/neu receptor pending(October, 2015)  3.patient started on chemotherapy with carboplatinum and Taxolfrom November 06, 2013 4.acute pulmonary embolism February of 2016 on xeralto. 5.  Because of insurance not paying for [place] patient has been switched over to Coumadin (May, 2016) 6.  Now on maintenance a Avastin (April, 2016)     Primary cancer of endometrium  10/23/2012 Initial Diagnosis Primary cancer of endometrium    Endometrial cancer   05/24/2014 Initial Diagnosis Endometrial cancer    PAST MEDICAL HISTORY: Past Medical  History  Diagnosis Date  . Cancer   . Primary cancer of endometrium 05/24/2014    PAST SURGICAL HISTORY: No past surgical history on file.  FAMILY HISTORY Family History  Problem Relation Age of Onset  . Breast cancer Sister 79    GYNECOLOGIC HISTORY:  No LMP recorded. Patient has had a hysterectomy.     ADVANCED DIRECTIVES:    HEALTH MAINTENANCE: Social History  Substance Use Topics  . Smoking status: Never Smoker   . Smokeless tobacco: Not on file  . Alcohol Use: Not on file     Colonoscopy:  PAP:  Bone density:  Lipid panel:  Allergies  Allergen Reactions  . Sulfa Antibiotics Anaphylaxis    Current Outpatient Prescriptions  Medication Sig Dispense Refill  . atenolol (TENORMIN) 100 MG tablet TAKE 1 TABLET BY MOUTH TWICE A DAY 60 tablet 0  . citalopram (CELEXA) 40 MG tablet Take 1 tablet (40 mg total) by mouth daily. 30 tablet 2  . [facility] SENNA PLUS 8.6-50 MG per tablet TAKE 1 TABLET BY MOUTH TWICE A DAY 60 tablet 3  . cyclobenzaprine (FLEXERIL) 5 MG tablet TAKE 1 TABLET (5 MG TOTAL) BY MOUTH 3 (THREE) TIMES DAILY AS NEEDED FOR MUSCLE SPASMS.  1  . famotidine (PEPCID) 20 MG tablet Take 20 mg by mouth 2 (two) times daily.    . fentaNYL (DURAGESIC - DOSED MCG/HR) 50 MCG/HR PLACE 1 PATCH ONTO SKIN EVERY 3 DAYS 10 patch 0  . lidocaine-prilocaine (EMLA) cream Apply 1 application topically as needed. 30 g 3  . loratadine (CLARITIN) 10 MG tablet Take 10 mg by mouth daily.    . mirtazapine (REMERON) 15 MG tablet Take 1 tablet (15 mg total) by mouth at bedtime. 30 tablet 3  . Multiple Vitamin (MULTIVITAMIN) capsule Take by mouth.    . ondansetron (ZOFRAN) 4 MG tablet Take 4 mg by mouth every 6 (six) hours as needed.  3  . oxyCODONE (ROXICODONE) 15 MG immediate release tablet   0  . psyllium (METAMUCIL) 58.6 % packet Take 1 packet by mouth daily.    Marland Kitchen warfarin (COUMADIN) 5 MG tablet TAKE 1 AND 1/2 TABLETS ON MON, WED AND FRIDAY AND 1 TABLET ON TUES, THURS, SATURDAY AND SUNDAY  45 tablet 1  . atenolol (TENORMIN) 25 MG tablet Take by mouth.     No current facility-administered medications for this visit.   Facility-Administered Medications Ordered in Other Visits  Medication Dose Route Frequency Provider Last Rate Last Dose  . heparin lock flush 100 unit/mL  500 Units Intravenous Once Forest Gleason, MD      . sodium chloride 0.9 % injection 10 mL  10 mL Intravenous PRN Forest Gleason, MD   10 mL at 08/27/14 1055    OBJECTIVE: BP 129/86 mmHg  Pulse 83  Temp(Src) 96.3 F (35.7 C) (Tympanic)  Wt 176 lb 9.4 oz (80.1 kg)   Body mass index is 30.3 kg/(m^2).    ECOG FS:1 - Symptomatic but completely ambulatory  General: Well-developed, well-nourished, no acute distress. Eyes: Pink conjunctiva, anicteric sclera. HEENT: Normocephalic, moist mucous membranes, clear oropharnyx. Lungs: Clear to auscultation bilaterally. Heart: Regular rate and rhythm. No rubs, murmurs, or gallops. Musculoskeletal: No edema, cyanosis, or clubbing. chronic right knee pain.  Neuro: Alert, answering all questions appropriately. Cranial nerves grossly intact. Skin: No rashes or  petechiae noted. Psych: Normal affect. Lymphatics: No cervical, calvicular, axillary or inguinal LAD.   LAB RESULTS:  Infusion on 08/27/2014  Component Date Value Ref Range Status  . WBC 08/27/2014 4.8  3.6 - 11.0 K/uL Final   A-LINE DRAW  . RBC 08/27/2014 4.52  3.80 - 5.20 MIL/uL Final  . Hemoglobin 08/27/2014 13.2  12.0 - 16.0 g/dL Final  . HCT 08/27/2014 39.9  35.0 - 47.0 % Final  . MCV 08/27/2014 88.5  80.0 - 100.0 fL Final  . MCH 08/27/2014 29.2  26.0 - 34.0 pg Final  . MCHC 08/27/2014 33.0  32.0 - 36.0 g/dL Final  . RDW 08/27/2014 15.1* 11.5 - 14.5 % Final  . Platelets 08/27/2014 195  150 - 440 K/uL Final  . Neutrophils Relative % 08/27/2014 45   Final  . Neutro Abs 08/27/2014 2.2  1.4 - 6.5 K/uL Final  . Lymphocytes Relative 08/27/2014 44   Final  . Lymphs Abs 08/27/2014 2.1  1.0 - 3.6 K/uL Final  .  Monocytes Relative 08/27/2014 6   Final  . Monocytes Absolute 08/27/2014 0.3  0.2 - 0.9 K/uL Final  . Eosinophils Relative 08/27/2014 4   Final  . Eosinophils Absolute 08/27/2014 0.2  0 - 0.7 K/uL Final  . Basophils Relative 08/27/2014 1   Final  . Basophils Absolute 08/27/2014 0.0  0 - 0.1 K/uL Final  . Sodium 08/27/2014 138  135 - 145 mmol/L Final  . Potassium 08/27/2014 3.5  3.5 - 5.1 mmol/L Final  . Chloride 08/27/2014 108  101 - 111 mmol/L Final  . CO2 08/27/2014 26  22 - 32 mmol/L Final  . Glucose, Bld 08/27/2014 139* 65 - 99 mg/dL Final  . BUN 08/27/2014 16  6 - 20 mg/dL Final  . Creatinine, Ser 08/27/2014 1.12* 0.44 - 1.00 mg/dL Final  . Calcium 08/27/2014 8.3* 8.9 - 10.3 mg/dL Final  . Total Protein 08/27/2014 6.5  6.5 - 8.1 g/dL Final  . Albumin 08/27/2014 3.7  3.5 - 5.0 g/dL Final  . AST 08/27/2014 22  15 - 41 U/L Final  . ALT 08/27/2014 16  14 - 54 U/L Final  . Alkaline Phosphatase 08/27/2014 60  38 - 126 U/L Final  . Total Bilirubin 08/27/2014 0.5  0.3 - 1.2 mg/dL Final  . GFR calc non Af Amer 08/27/2014 52* >60 mL/min Final  . GFR calc Af Amer 08/27/2014 >60  >60 mL/min Final   Comment: (NOTE) The eGFR has been calculated using the CKD EPI equation. This calculation has not been validated in all clinical situations. eGFR's persistently <60 mL/min signify possible Chronic Kidney Disease.   . Anion gap 08/27/2014 4* 5 - 15 Final  . Prothrombin Time 08/27/2014 29.4* 11.4 - 15.0 seconds Final  . INR 08/27/2014 2.78   Final    STUDIES: No results found.  ASSESSMENT:  Endometrial cancer Recent PE, on Coumadin therapy Chronic right knee pain   PLAN:    1. Endometrial CA. Patient with known metastasis to bone and lymph node. She is currently on maintenance Avastin at this time. We will continue with this regimen scheduled every 3 weeks. 2. Recent PE. INR reported as 2.78 today, we'll continue with current Coumadin dosing. 3. Chronic right knee pain. Patient reports  that knee pain has worsened over the last few weeks. She reports this as being chronic in nature. We'll refer her to orthopedic M.D. patient is agreeable to this.  We'll continue with follow-up for her next Avastin treatment in  3 weeks.   Patient expressed understanding and was in agreement with this plan. She also understands that She can call clinic at any time with any questions, concerns, or complaints.  Dr. Grayland Ormond was available for consultation and review of plan of care for this patient.  Primary cancer of endometrium   Staging form: Corpus Uteri - Carcinoma, AJCC 7th Edition     Clinical: Stage IVB (T1a, N0, M1) - Signed by Forest Gleason, MD on 05/24/2014     Pathologic: No stage assigned - Unsigned   Evlyn Kanner, NP   08/27/2014 11:52 AM

## 2014-08-28 LAB — CA 125: CA 125: 21.1 U/mL (ref 0.0–38.1)

## 2014-09-02 ENCOUNTER — Ambulatory Visit (INDEPENDENT_AMBULATORY_CARE_PROVIDER_SITE_OTHER): Payer: No Typology Code available for payment source | Admitting: Family Medicine

## 2014-09-02 ENCOUNTER — Encounter: Payer: Self-pay | Admitting: Family Medicine

## 2014-09-02 VITALS — BP 124/72 | HR 77 | Temp 98.1°F | Resp 16 | Ht 64.0 in | Wt 177.8 lb

## 2014-09-02 DIAGNOSIS — Z0000 Encounter for general adult medical examination without abnormal findings: Secondary | ICD-10-CM

## 2014-09-02 DIAGNOSIS — C541 Malignant neoplasm of endometrium: Secondary | ICD-10-CM | POA: Diagnosis not present

## 2014-09-02 DIAGNOSIS — I10 Essential (primary) hypertension: Secondary | ICD-10-CM | POA: Diagnosis not present

## 2014-09-02 MED ORDER — ATENOLOL 100 MG PO TABS: 100.0000 mg | ORAL_TABLET | Freq: Every day | ORAL | Status: DC

## 2014-09-02 MED ORDER — FAMOTIDINE 20 MG PO TABS: 20.0000 mg | ORAL_TABLET | Freq: Two times a day (BID) | ORAL | Status: AC

## 2014-09-02 MED ORDER — LORATADINE 10 MG PO TABS: 10.0000 mg | ORAL_TABLET | Freq: Every day | ORAL | Status: DC

## 2014-09-03 ENCOUNTER — Encounter: Payer: Self-pay | Admitting: Family Medicine

## 2014-09-03 NOTE — Progress Notes (Signed)
Name: Emily Zamora   MRN: [id number]    DOB: Jul 13, 1955   Date:09/03/2014       Progress Note  Subjective  Chief Complaint  Chief Complaint  Patient presents with  . Annual Exam    HPI  Patient presents for annual H&P. 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 She is currently under chemotherapy therapy and radiation as well. She has seen her gynecologist on Lasix once per month basis.  Despite the above she has continued with a very positive attitude as a believer in [employer] and knows that she has open the [facility] beyond her life. Her attitude and smiled contagious.  Past Medical History  Diagnosis Date  . Cancer   . Primary cancer of endometrium 05/24/2014  . Hypertension   . Knee pain, bilateral   . Arthritis     Social History  Substance Use Topics  . Smoking status: Never Smoker   . Smokeless tobacco: Not on file  . Alcohol Use: Not on file     Current outpatient prescriptions:  .  aspirin 325 MG tablet, Take 325 mg by mouth daily., Disp: , Rfl:  .  psyllium (METAMUCIL) 58.6 % powder, Take 1 packet by mouth 3 (three) times daily., Disp: , Rfl:  .  atenolol (TENORMIN) 100 MG tablet, Take 1 tablet (100 mg total) by mouth daily., Disp: 30 tablet, Rfl: 5 .  citalopram (CELEXA) 40 MG tablet, Take 1 tablet (40 mg total) by mouth daily., Disp: 30 tablet, Rfl: 2 .  [facility] SENNA PLUS 8.6-50 MG per tablet, TAKE 1 TABLET BY MOUTH TWICE A DAY, Disp: 60 tablet, Rfl: 3 .  cyclobenzaprine (FLEXERIL) 5 MG tablet, TAKE 1 TABLET (5 MG TOTAL) BY MOUTH 3 (THREE) TIMES DAILY AS NEEDED FOR MUSCLE SPASMS., Disp: , Rfl: 1 .  famotidine (PEPCID) 20 MG tablet, Take 1 tablet (20 mg total) by mouth 2 (two) times daily., Disp: 60 tablet, Rfl: 5 .  fentaNYL (DURAGESIC - DOSED MCG/HR) 50 MCG/HR, PLACE 1 PATCH ONTO SKIN EVERY 3 DAYS, Disp: 10 patch, Rfl: 0 .  lidocaine-prilocaine (EMLA) cream, Apply 1 application topically as  needed., Disp: 30 g, Rfl: 3 .  loratadine (CLARITIN) 10 MG tablet, Take 1 tablet (10 mg total) by mouth daily., Disp: 30 tablet, Rfl: 5 .  mirtazapine (REMERON) 15 MG tablet, Take 1 tablet (15 mg total) by mouth at bedtime., Disp: 30 tablet, Rfl: 3 .  Multiple Vitamin (MULTIVITAMIN) capsule, Take by mouth., Disp: , Rfl:  .  ondansetron (ZOFRAN) 4 MG tablet, Take 4 mg by mouth every 6 (six) hours as needed., Disp: , Rfl: 3 .  oxyCODONE (ROXICODONE) 15 MG immediate release tablet, , Disp: , Rfl: 0 .  warfarin (COUMADIN) 5 MG tablet, TAKE 1 AND 1/2 TABLETS ON MON, WED AND FRIDAY AND 1 TABLET ON TUES, THURS, SATURDAY AND SUNDAY, Disp: 45 tablet, Rfl: 1  Allergies  Allergen Reactions  . Sulfa Antibiotics Anaphylaxis    Review of Systems  Constitutional: Positive for weight loss. Negative for fever and chills.  HENT: Negative for congestion, hearing loss, sore throat and tinnitus.   Eyes: Negative for blurred vision, double vision and redness.  Respiratory: Negative for cough, hemoptysis and shortness of breath.   Cardiovascular: Negative for chest pain, palpitations, orthopnea, claudication and leg swelling.  Gastrointestinal: Negative for heartburn, nausea, vomiting, diarrhea, constipation and blood in stool.  Genitourinary: Negative for dysuria, urgency, frequency and hematuria.  Musculoskeletal: Positive for myalgias, back pain and joint pain. Negative for falls and neck pain.  Skin: Negative for itching.       Hair loss  Neurological: Positive for sensory change and weakness. Negative for dizziness, tingling, tremors, focal weakness, seizures, loss of consciousness and headaches.  Endo/Heme/Allergies: Does not bruise/bleed easily.  Psychiatric/Behavioral: Negative for depression, suicidal ideas and substance abuse. The patient is not nervous/anxious and does not have insomnia.      Objective  Filed Vitals:   09/02/14 1100  BP: 124/72  Pulse: 77  Temp: 98.1 F (36.7 C)  TempSrc:  Oral  Resp: 16  Height: 5\' 4"  (1.626 m)  Weight: 177 lb 12.8 oz (80.65 kg)  SpO2: 97%     Physical Exam  Constitutional: She is oriented to person, place, and time and well-developed, well-nourished, and in no distress.  HENT:  Head: Normocephalic.  Eyes: EOM are normal. Pupils are equal, round, and reactive to light.  Neck: Normal range of motion. No thyromegaly present.  Cardiovascular: Normal rate, regular rhythm and normal heart sounds.   No murmur heard. Pulmonary/Chest: Effort normal and breath sounds normal.  Abdominal: Soft. Bowel sounds are normal.  Genitourinary:  Breast and GYN per her her surgeon and gynecologist and oncologist  Musculoskeletal: Normal range of motion. She exhibits no edema.  Neurological: She is alert and oriented to person, place, and time. No cranial nerve deficit. Gait normal.  Skin: Skin is warm and dry. No rash noted.  Psychiatric: Memory and affect normal.      Assessment & Plan   1. Annual physical exam Seeing gynecologist and surgeon for her Pap and breast exam and mammogram  2. Endometrial cancer Followed by hematology oncology and general surgery and GYN - CBC - Comprehensive metabolic panel - TSH - loratadine (CLARITIN) 10 MG tablet; Take 1 tablet (10 mg total) by mouth daily.  Dispense: 30 tablet; Refill: 5 - famotidine (PEPCID) 20 MG tablet; Take 1 tablet (20 mg total) by mouth 2 (two) times daily.  Dispense: 60 tablet; Refill: 5  3. Essential hypertension Well-controlled - CBC - Comprehensive metabolic panel - TSH  4. Encounter for preventive health examination  - CBC - Comprehensive metabolic panel - Lipid panel - TSH

## 2014-09-12 ENCOUNTER — Other Ambulatory Visit: Payer: Self-pay | Admitting: *Deleted

## 2014-09-12 DIAGNOSIS — C541 Malignant neoplasm of endometrium: Secondary | ICD-10-CM

## 2014-09-17 ENCOUNTER — Inpatient Hospital Stay: Payer: No Typology Code available for payment source | Attending: Oncology

## 2014-09-17 ENCOUNTER — Inpatient Hospital Stay (HOSPITAL_BASED_OUTPATIENT_CLINIC_OR_DEPARTMENT_OTHER): Payer: No Typology Code available for payment source | Admitting: Oncology

## 2014-09-17 ENCOUNTER — Inpatient Hospital Stay: Payer: No Typology Code available for payment source

## 2014-09-17 ENCOUNTER — Encounter: Payer: Self-pay | Admitting: Oncology

## 2014-09-17 VITALS — BP 128/87 | HR 82 | Temp 98.3°F | Wt 176.4 lb

## 2014-09-17 DIAGNOSIS — M25561 Pain in right knee: Secondary | ICD-10-CM | POA: Diagnosis not present

## 2014-09-17 DIAGNOSIS — I10 Essential (primary) hypertension: Secondary | ICD-10-CM | POA: Insufficient documentation

## 2014-09-17 DIAGNOSIS — M25562 Pain in left knee: Secondary | ICD-10-CM | POA: Insufficient documentation

## 2014-09-17 DIAGNOSIS — Z7982 Long term (current) use of aspirin: Secondary | ICD-10-CM

## 2014-09-17 DIAGNOSIS — R109 Unspecified abdominal pain: Secondary | ICD-10-CM

## 2014-09-17 DIAGNOSIS — Z5111 Encounter for antineoplastic chemotherapy: Secondary | ICD-10-CM | POA: Diagnosis not present

## 2014-09-17 DIAGNOSIS — M129 Arthropathy, unspecified: Secondary | ICD-10-CM

## 2014-09-17 DIAGNOSIS — R188 Other ascites: Secondary | ICD-10-CM | POA: Diagnosis not present

## 2014-09-17 DIAGNOSIS — C541 Malignant neoplasm of endometrium: Secondary | ICD-10-CM | POA: Insufficient documentation

## 2014-09-17 DIAGNOSIS — Z170 Estrogen receptor positive status [ER+]: Secondary | ICD-10-CM

## 2014-09-17 DIAGNOSIS — I2699 Other pulmonary embolism without acute cor pulmonale: Secondary | ICD-10-CM | POA: Insufficient documentation

## 2014-09-17 DIAGNOSIS — C779 Secondary and unspecified malignant neoplasm of lymph node, unspecified: Secondary | ICD-10-CM | POA: Diagnosis not present

## 2014-09-17 DIAGNOSIS — C7951 Secondary malignant neoplasm of bone: Secondary | ICD-10-CM | POA: Insufficient documentation

## 2014-09-17 DIAGNOSIS — Z7901 Long term (current) use of anticoagulants: Secondary | ICD-10-CM

## 2014-09-17 DIAGNOSIS — K219 Gastro-esophageal reflux disease without esophagitis: Secondary | ICD-10-CM

## 2014-09-17 DIAGNOSIS — Z803 Family history of malignant neoplasm of breast: Secondary | ICD-10-CM | POA: Diagnosis not present

## 2014-09-17 DIAGNOSIS — Z23 Encounter for immunization: Secondary | ICD-10-CM | POA: Diagnosis not present

## 2014-09-17 DIAGNOSIS — F329 Major depressive disorder, single episode, unspecified: Secondary | ICD-10-CM

## 2014-09-17 DIAGNOSIS — K7460 Unspecified cirrhosis of liver: Secondary | ICD-10-CM | POA: Insufficient documentation

## 2014-09-17 DIAGNOSIS — Z79899 Other long term (current) drug therapy: Secondary | ICD-10-CM | POA: Insufficient documentation

## 2014-09-17 LAB — COMPREHENSIVE METABOLIC PANEL
ALBUMIN: 3.7 g/dL (ref 3.5–5.0)
ALT: 20 U/L (ref 14–54)
ANION GAP: 8 (ref 5–15)
AST: 30 U/L (ref 15–41)
Alkaline Phosphatase: 74 U/L (ref 38–126)
BILIRUBIN TOTAL: 0.9 mg/dL (ref 0.3–1.2)
BUN: 17 mg/dL (ref 6–20)
CO2: 29 mmol/L (ref 22–32)
Calcium: 9.2 mg/dL (ref 8.9–10.3)
Chloride: 105 mmol/L (ref 101–111)
Creatinine, Ser: 1.11 mg/dL — ABNORMAL HIGH (ref 0.44–1.00)
GFR, EST NON AFRICAN AMERICAN: 52 mL/min — AB (ref >60)
GLUCOSE: 143 mg/dL — AB (ref 65–99)
POTASSIUM: 3.6 mmol/L (ref 3.5–5.1)
Sodium: 142 mmol/L (ref 135–145)
TOTAL PROTEIN: 6.8 g/dL (ref 6.5–8.1)

## 2014-09-17 LAB — CBC WITH DIFFERENTIAL/PLATELET
BASOS PCT: 1 %
Basophils Absolute: 0 10*3/uL (ref 0–0.1)
Eosinophils Absolute: 0.1 10*3/uL (ref 0–0.7)
Eosinophils Relative: 2 %
HEMATOCRIT: 39.7 % (ref 35.0–47.0)
Hemoglobin: 12.9 g/dL (ref 12.0–16.0)
Lymphocytes Relative: 36 %
Lymphs Abs: 2.2 10*3/uL (ref 1.0–3.6)
MCH: 28.8 pg (ref 26.0–34.0)
MCHC: 32.4 g/dL (ref 32.0–36.0)
MCV: 88.8 fL (ref 80.0–100.0)
MONO ABS: 0.5 10*3/uL (ref 0.2–0.9)
MONOS PCT: 8 %
NEUTROS ABS: 3.3 10*3/uL (ref 1.4–6.5)
Neutrophils Relative %: 53 %
Platelets: 197 10*3/uL (ref 150–440)
RBC: 4.47 MIL/uL (ref 3.80–5.20)
RDW: 15.3 % — AB (ref 11.5–14.5)
WBC: 6.1 10*3/uL (ref 3.6–11.0)

## 2014-09-17 LAB — PROTIME-INR
INR: 3.46
PROTHROMBIN TIME: 34.8 s — AB (ref 11.4–15.0)

## 2014-09-17 LAB — PROTEIN, URINE, RANDOM: Total Protein, Urine: 20 mg/dL

## 2014-09-17 MED ORDER — FENTANYL 50 MCG/HR TD PT72: MEDICATED_PATCH | TRANSDERMAL | Status: DC

## 2014-09-17 MED ORDER — SODIUM CHLORIDE 0.9 % IV SOLN
Freq: Once | INTRAVENOUS | Status: AC
  Administered 2014-09-17: 11:00:00 via INTRAVENOUS
  Filled 2014-09-17: qty 1000

## 2014-09-17 MED ORDER — SODIUM CHLORIDE 0.9 % IV SOLN
10.0000 mg/kg | Freq: Once | INTRAVENOUS | Status: AC
  Administered 2014-09-17: 750 mg via INTRAVENOUS
  Filled 2014-09-17: qty 30

## 2014-09-17 MED ORDER — HEPARIN SOD (PORK) LOCK FLUSH 100 UNIT/ML IV SOLN
500.0000 [IU] | Freq: Once | INTRAVENOUS | Status: AC | PRN
  Administered 2014-09-17: 500 [IU]
  Filled 2014-09-17: qty 5

## 2014-09-17 MED ORDER — INFLUENZA VAC SPLIT QUAD 0.5 ML IM SUSY
0.5000 mL | PREFILLED_SYRINGE | Freq: Once | INTRAMUSCULAR | Status: AC
  Administered 2014-09-17: 0.5 mL via INTRAMUSCULAR
  Filled 2014-09-17: qty 0.5

## 2014-09-17 MED ORDER — SODIUM CHLORIDE 0.9 % IJ SOLN
10.0000 mL | INTRAMUSCULAR | Status: DC | PRN
  Administered 2014-09-17: 10 mL
  Filled 2014-09-17: qty 10

## 2014-09-17 MED ORDER — PNEUMOCOCCAL 13-VAL CONJ VACC IM SUSP
0.5000 mL | Freq: Once | INTRAMUSCULAR | Status: AC
  Administered 2014-09-17: 0.5 mL via INTRAMUSCULAR
  Filled 2014-09-17: qty 0.5

## 2014-09-17 NOTE — Progress Notes (Signed)
[place] @ [facility] [phone]  Fax:([phone]) [place]: Apr 22, 1955  MR#: [id number]  HER#:[id number]  Patient Care Team: Ashok Norris, MD as PCP - General (Family Medicine)  CHIEF COMPLAINT:  No chief complaint on file.   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 abdominal paracentesis x1 negative for malignant cells(22nd September)  2.PET scan shows extensive disease with lymph node metastases and bone metastases T1 N0 M1 disease biopsy is consistent with adenocarcinoma GYN or lesion.  Most likely from the endometrial cancer.  Estrogen receptor positive.  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 abdominal paracentesis x1 negative for malignant cells(22nd September)  2.PET scan shows extensive disease with lymph node metastases and bone metastases T1 N0 M1 disease biopsy is consistent with adenocarcinoma GYN or lesion.  Most likely from the endometrial cancer.  Estrogen receptor positive.  HER-2/neu receptor pending(October, 2015)  3.patient started on chemotherapy with carboplatinum and Taxolfrom November 06, 2013 4.acute pulmonary embolism February of 2016 on xeralto. 5.  Because of insurance not paying for [place] patient has been switched over to Coumadin (May, 2016) 6.  Now on maintenance a  Avastin (April, 2016)     Primary cancer of endometrium   10/23/2012 Initial Diagnosis Primary cancer of endometrium    Endometrial cancer   05/24/2014 Initial Diagnosis Endometrial cancer    Oncology Flowsheet 06/03/2014 06/24/2014 07/15/2014 08/05/2014 08/27/2014  bevacizumab (AVASTIN) IV 10 mg/kg 10 mg/kg 10 mg/kg 10 mg/kg 10 mg/kg    INTERVAL HISTORY: Extremely-year-old lady with stage IV carcinoma of endometrium and has finished total 8cycles of chemotherapy with carboplatinum Taxol and Avastin.  Now on maintenance a Avastin therapy.  Abdominal pain has improved.  Patient is less depressed.  Celexa was increased to 40 mg daily. Here for further follow-up and treatment consideration Because of abdominal pain is improving with decrease fentanyl patch to 50 g. Appetite is improving.  No nausea.  No vomiting.  No diarrhea. July 15, 2014 Patient is here for ongoing evaluation.  Abdominal pain has improved.  Patient continues to a problem with insomnia.  Patient is also somewhat depressed in spite of taking Celexa 40 mg by mouth daily.  No nausea.  No vomiting.  No shortness of breath.  Patient is on Coumadin.  Also has some joint pains particularly in both knees.  No abdominal pain no nausea.  No abdominal distention. Tumor markers are slowly declining August 2nd,  2016 Patient is here for ongoing evaluation and treatment consideration. Chills and fever.  No nausea or vomiting.  No abdominal pain Patient has some arthritis pain in both knee.  Taking Coumadin on a regular basis September 17, 2014 Patient is here for ongoing  evaluation and treatment consideration continues to have knee pain no abdominal pain no nausea no vomiting.  Last PET scan was in May.  Here for further follow-up regarding stage IV GYN malignancy metastases to the bone   REVIEW OF SYSTEMS:   Gen. status: In general status is improved.  Patient is less depressed. HEENT: Alopecia.  No soreness in the mouth. Lymphatic  system: No abnormality. Lungs: Shortness of breath on exertion. Cardiac: No chest pain.  No palpitation GI: No nausea no vomiting.  I abdominal pain is improved.  No nausea.  No vomiting.  Appetite is improving.  Lower extremity no swelling. Skin: No rash or ecchymosis Neurological system: No headache no dizziness Psychiatric system: Patient is much more cheerful less depressed  As per HPI. Otherwise, a complete review of systems is negatve.  PAST MEDICAL HISTORY: Past Medical History  Diagnosis Date  . Cancer   . Primary cancer of endometrium 05/24/2014  . Hypertension   . Knee pain, bilateral   . Arthritis     Significant History/PMH:   Stage 4 peritoneal Cancer:    Cirrhosis:    GERD:    endometerial cancer:    Arthritis:    Hypertension:    Migraines:    Hysterectomy:    Cholecystectomy:    Dilation and Curretage: with hysteroscopy, Aug 2012 FAMILY HISTORY Family History  Problem Relation Age of Onset  . Breast cancer Sister 106  . Hypertension Sister   . Diabetes Mother   . Hypertension Mother       ADVANCED DIRECTIVES:does have advanced healthcare directive   HEALTH MAINTENANCE: Social History  Substance Use Topics  . Smoking status: Never Smoker   . Smokeless tobacco: None  . Alcohol Use: None    :  Allergies  Allergen Reactions  . Sulfa Antibiotics Anaphylaxis    Current Outpatient Prescriptions  Medication Sig Dispense Refill  . aspirin 325 MG tablet Take 325 mg by mouth daily.    Marland Kitchen atenolol (TENORMIN) 100 MG tablet Take 1 tablet (100 mg total) by mouth daily. 30 tablet 5  . citalopram (CELEXA) 40 MG tablet Take 1 tablet (40 mg total) by mouth daily. 30 tablet 2  . [facility] SENNA PLUS 8.6-50 MG per tablet TAKE 1 TABLET BY MOUTH TWICE A DAY 60 tablet 3  . cyclobenzaprine (FLEXERIL) 5 MG tablet TAKE 1 TABLET (5 MG TOTAL) BY MOUTH 3 (THREE) TIMES DAILY AS NEEDED FOR MUSCLE SPASMS.  1  . famotidine (PEPCID) 20 MG tablet Take 1 tablet (20 mg  total) by mouth 2 (two) times daily. 60 tablet 5  . fentaNYL (DURAGESIC - DOSED MCG/HR) 50 MCG/HR PLACE 1 PATCH ONTO SKIN EVERY 3 DAYS 10 patch 0  . lidocaine-prilocaine (EMLA) cream Apply 1 application topically as needed. 30 g 3  . loratadine (CLARITIN) 10 MG tablet Take 1 tablet (10 mg total) by mouth daily. 30 tablet 5  . mirtazapine (REMERON) 15 MG tablet Take 1 tablet (15 mg total) by mouth at bedtime. 30 tablet 3  . Multiple Vitamin (MULTIVITAMIN) capsule Take by mouth.    . ondansetron (ZOFRAN) 4 MG tablet Take 4 mg by mouth every 6 (six) hours as needed.  3  . oxyCODONE (ROXICODONE) 15 MG immediate release tablet   0  . psyllium (METAMUCIL) 58.6 % powder Take 1 packet by mouth 3 (three) times daily.    Marland Kitchen warfarin (COUMADIN) 5 MG tablet TAKE 1 AND 1/2 TABLETS ON MON, WED AND FRIDAY AND 1 TABLET ON TUES,  THURS, SATURDAY AND SUNDAY 45 tablet 1   No current facility-administered medications for this visit.    OBJECTIVE:  Filed Vitals:   09/17/14 1007  BP: 128/87  Pulse: 82  Temp: 98.3 F (36.8 C)     Body mass index is 30.26 kg/(m^2).    ECOG FS:1 - Symptomatic but completely ambulatory  PHYSICAL EXAM: GENERAL:  Well developed, well nourished, sitting comfortably in the exam room in no acute distress. MENTAL STATUS:  Alert and oriented to person, place and time. HEAD:  Alopecia.  Normocephalic, atraumatic, face symmetric, no Cushingoid features. EYES: pale  Pupils equal round and reactive to light and accomodation.  No conjunctivitis or scleral icterus. ENT:  Oropharynx clear without lesion.  Tongue normal. Mucous membranes moist.  RESPIRATORY:  Clear to auscultation without rales, wheezes or rhonchi. CARDIOVASCULAR:  Regular rate and rhythm without murmur, rub or gallop. . ABDOMEN:  Soft, non-tender, with active bowel sounds, and no hepatosplenomegaly.  No masses. Tenderness in lower abdominal area.  No palpable masses. BACK:  No CVA tenderness.  No tenderness on percussion  of the back or rib cage. SKIN:  No rashes, ulcers or lesions. EXTREMITIES: No edema, no skin discoloration or tenderness.  No palpable cords. LYMPH NODES: No palpable cervical, supraclavicular, axillary or inguinal adenopathy  NEUROLOGICAL: Unremarkable. PSYCH:  Less depressed LAB RESULTS:  Infusion on 09/17/2014  Component Date Value Ref Range Status  . WBC 09/17/2014 6.1  3.6 - 11.0 K/uL Final  . RBC 09/17/2014 4.47  3.80 - 5.20 MIL/uL Final  . Hemoglobin 09/17/2014 12.9  12.0 - 16.0 g/dL Final  . HCT 09/17/2014 39.7  35.0 - 47.0 % Final  . MCV 09/17/2014 88.8  80.0 - 100.0 fL Final  . MCH 09/17/2014 28.8  26.0 - 34.0 pg Final  . MCHC 09/17/2014 32.4  32.0 - 36.0 g/dL Final  . RDW 09/17/2014 15.3* 11.5 - 14.5 % Final  . Platelets 09/17/2014 197  150 - 440 K/uL Final  . Neutrophils Relative % 09/17/2014 53   Final  . Neutro Abs 09/17/2014 3.3  1.4 - 6.5 K/uL Final  . Lymphocytes Relative 09/17/2014 36   Final  . Lymphs Abs 09/17/2014 2.2  1.0 - 3.6 K/uL Final  . Monocytes Relative 09/17/2014 8   Final  . Monocytes Absolute 09/17/2014 0.5  0.2 - 0.9 K/uL Final  . Eosinophils Relative 09/17/2014 2   Final  . Eosinophils Absolute 09/17/2014 0.1  0 - 0.7 K/uL Final  . Basophils Relative 09/17/2014 1   Final  . Basophils Absolute 09/17/2014 0.0  0 - 0.1 K/uL Final  . Sodium 09/17/2014 142  135 - 145 mmol/L Final  . Potassium 09/17/2014 3.6  3.5 - 5.1 mmol/L Final  . Chloride 09/17/2014 105  101 - 111 mmol/L Final  . CO2 09/17/2014 29  22 - 32 mmol/L Final  . Glucose, Bld 09/17/2014 143* 65 - 99 mg/dL Final  . BUN 09/17/2014 17  6 - 20 mg/dL Final  . Creatinine, Ser 09/17/2014 1.11* 0.44 - 1.00 mg/dL Final  . Calcium 09/17/2014 9.2  8.9 - 10.3 mg/dL Final  . Total Protein 09/17/2014 6.8  6.5 - 8.1 g/dL Final  . Albumin 09/17/2014 3.7  3.5 - 5.0 g/dL Final  . AST 09/17/2014 30  15 - 41 U/L Final  . ALT 09/17/2014 20  14 - 54 U/L Final  . Alkaline Phosphatase 09/17/2014 74  38 - 126  U/L Final  . Total Bilirubin 09/17/2014 0.9  0.3 -  1.2 mg/dL Final  . GFR calc non Af Amer 09/17/2014 52* >60 mL/min Final  . GFR calc Af Amer 09/17/2014 >60  >60 mL/min Final   Comment: (NOTE) The eGFR has been calculated using the CKD EPI equation. This calculation has not been validated in all clinical situations. eGFR's persistently <60 mL/min signify possible Chronic Kidney Disease.   . Anion gap 09/17/2014 8  5 - 15 Final     Lab Results  Component Value Date   CA125 21.1 08/27/2014   Lab Results  Component Value Date   CEA 3.5 07/15/2014    Lab Results  Component Value Date   CA125 21.1 08/27/2014   STUDIES: PET scan which was done in May of 2016 has been reviewed with the patient. 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  Patient is on Coumadin pro time will be checked.   On clinical examination there is no evidence of recurrent disease  Markers are stable  MEDICAL DECISION MAKING:  We will continue Avastin at present time. Abdominal pain is under control.  Patient is less depressed. PET scan would be done after next treatment Pro time will be checked and dose of Coumadin will be adjusted if needed diastolic blood pressure is slightly high.  Patient is taking all systolic blood pressure is below 150 so we will continue Avastin   Patient expressed understanding and was in agreement with this plan. She also understands that She can call clinic at any time with any questions, concerns, or complaints.    Primary cancer of endometrium   Staging form: Corpus Uteri - Carcinoma, AJCC 7th Edition     Clinical: Stage IVB (T1a, N0, M1) - Signed by Forest Gleason, MD on 05/24/2014     Pathologic: No stage assigned - Marni Griffon, MD   09/17/2014 10:30 AM

## 2014-09-18 LAB — CA 125: CA 125: 23.9 U/mL (ref 0.0–38.1)

## 2014-09-27 ENCOUNTER — Other Ambulatory Visit: Payer: Self-pay | Admitting: Oncology

## 2014-09-29 NOTE — Telephone Encounter (Signed)
Dose reduced to 5 mg daily, have called and left VM for pt to return my call

## 2014-10-01 NOTE — Telephone Encounter (Signed)
Patient called back and I instructed her regarding the dose change of her warfarin to 1 5 mg tablet daily, she repeated this back to me

## 2014-10-08 ENCOUNTER — Inpatient Hospital Stay (HOSPITAL_BASED_OUTPATIENT_CLINIC_OR_DEPARTMENT_OTHER): Payer: No Typology Code available for payment source | Admitting: Oncology

## 2014-10-08 ENCOUNTER — Inpatient Hospital Stay: Payer: No Typology Code available for payment source

## 2014-10-08 ENCOUNTER — Inpatient Hospital Stay: Payer: No Typology Code available for payment source | Attending: Oncology

## 2014-10-08 VITALS — BP 120/80 | HR 81 | Temp 96.4°F | Wt 177.9 lb

## 2014-10-08 DIAGNOSIS — Z8669 Personal history of other diseases of the nervous system and sense organs: Secondary | ICD-10-CM | POA: Diagnosis not present

## 2014-10-08 DIAGNOSIS — M545 Low back pain: Secondary | ICD-10-CM | POA: Diagnosis not present

## 2014-10-08 DIAGNOSIS — Z9049 Acquired absence of other specified parts of digestive tract: Secondary | ICD-10-CM | POA: Diagnosis not present

## 2014-10-08 DIAGNOSIS — Z7982 Long term (current) use of aspirin: Secondary | ICD-10-CM | POA: Diagnosis not present

## 2014-10-08 DIAGNOSIS — R188 Other ascites: Secondary | ICD-10-CM

## 2014-10-08 DIAGNOSIS — K7460 Unspecified cirrhosis of liver: Secondary | ICD-10-CM

## 2014-10-08 DIAGNOSIS — C779 Secondary and unspecified malignant neoplasm of lymph node, unspecified: Secondary | ICD-10-CM | POA: Diagnosis not present

## 2014-10-08 DIAGNOSIS — I10 Essential (primary) hypertension: Secondary | ICD-10-CM | POA: Insufficient documentation

## 2014-10-08 DIAGNOSIS — C541 Malignant neoplasm of endometrium: Secondary | ICD-10-CM

## 2014-10-08 DIAGNOSIS — R971 Elevated cancer antigen 125 [CA 125]: Secondary | ICD-10-CM

## 2014-10-08 DIAGNOSIS — M25562 Pain in left knee: Secondary | ICD-10-CM

## 2014-10-08 DIAGNOSIS — I2699 Other pulmonary embolism without acute cor pulmonale: Secondary | ICD-10-CM

## 2014-10-08 DIAGNOSIS — K219 Gastro-esophageal reflux disease without esophagitis: Secondary | ICD-10-CM | POA: Diagnosis not present

## 2014-10-08 DIAGNOSIS — M25561 Pain in right knee: Secondary | ICD-10-CM | POA: Diagnosis not present

## 2014-10-08 DIAGNOSIS — C7951 Secondary malignant neoplasm of bone: Secondary | ICD-10-CM | POA: Insufficient documentation

## 2014-10-08 DIAGNOSIS — Z170 Estrogen receptor positive status [ER+]: Secondary | ICD-10-CM | POA: Insufficient documentation

## 2014-10-08 DIAGNOSIS — Z5111 Encounter for antineoplastic chemotherapy: Secondary | ICD-10-CM | POA: Insufficient documentation

## 2014-10-08 DIAGNOSIS — Z79899 Other long term (current) drug therapy: Secondary | ICD-10-CM | POA: Insufficient documentation

## 2014-10-08 LAB — CBC WITH DIFFERENTIAL/PLATELET
Basophils Absolute: 0 10*3/uL (ref 0–0.1)
Basophils Relative: 1 %
EOS PCT: 4 %
Eosinophils Absolute: 0.3 10*3/uL (ref 0–0.7)
HEMATOCRIT: 38.1 % (ref 35.0–47.0)
Hemoglobin: 12.6 g/dL (ref 12.0–16.0)
LYMPHS ABS: 1.7 10*3/uL (ref 1.0–3.6)
LYMPHS PCT: 25 %
MCH: 29.1 pg (ref 26.0–34.0)
MCHC: 33 g/dL (ref 32.0–36.0)
MCV: 88.1 fL (ref 80.0–100.0)
MONO ABS: 0.5 10*3/uL (ref 0.2–0.9)
MONOS PCT: 8 %
NEUTROS ABS: 4.1 10*3/uL (ref 1.4–6.5)
Neutrophils Relative %: 62 %
PLATELETS: 183 10*3/uL (ref 150–440)
RBC: 4.32 MIL/uL (ref 3.80–5.20)
RDW: 15.7 % — AB (ref 11.5–14.5)
WBC: 6.6 10*3/uL (ref 3.6–11.0)

## 2014-10-08 LAB — COMPREHENSIVE METABOLIC PANEL
ALT: 20 U/L (ref 14–54)
ANION GAP: 5 (ref 5–15)
AST: 28 U/L (ref 15–41)
Albumin: 3.6 g/dL (ref 3.5–5.0)
Alkaline Phosphatase: 76 U/L (ref 38–126)
BILIRUBIN TOTAL: 0.6 mg/dL (ref 0.3–1.2)
BUN: 18 mg/dL (ref 6–20)
CO2: 28 mmol/L (ref 22–32)
Calcium: 8.2 mg/dL — ABNORMAL LOW (ref 8.9–10.3)
Chloride: 102 mmol/L (ref 101–111)
Creatinine, Ser: 1.21 mg/dL — ABNORMAL HIGH (ref 0.44–1.00)
GFR, EST AFRICAN AMERICAN: 55 mL/min — AB (ref >60)
GFR, EST NON AFRICAN AMERICAN: 47 mL/min — AB (ref >60)
Glucose, Bld: 136 mg/dL — ABNORMAL HIGH (ref 65–99)
POTASSIUM: 4.1 mmol/L (ref 3.5–5.1)
Sodium: 135 mmol/L (ref 135–145)
TOTAL PROTEIN: 6.7 g/dL (ref 6.5–8.1)

## 2014-10-08 LAB — PROTIME-INR
INR: 1.91
PROTHROMBIN TIME: 22 s — AB (ref 11.4–15.0)

## 2014-10-08 LAB — PROTEIN, URINE, RANDOM: Total Protein, Urine: 30 mg/dL

## 2014-10-08 LAB — MAGNESIUM: MAGNESIUM: 1.7 mg/dL (ref 1.7–2.4)

## 2014-10-08 MED ORDER — SODIUM CHLORIDE 0.9 % IV SOLN
Freq: Once | INTRAVENOUS | Status: AC
  Administered 2014-10-08: 11:00:00 via INTRAVENOUS
  Filled 2014-10-08: qty 1000

## 2014-10-08 MED ORDER — FENTANYL 12 MCG/HR TD PT72: 12.5000 ug | MEDICATED_PATCH | TRANSDERMAL | Status: DC

## 2014-10-08 MED ORDER — HEPARIN SOD (PORK) LOCK FLUSH 100 UNIT/ML IV SOLN
500.0000 [IU] | Freq: Once | INTRAVENOUS | Status: DC | PRN
  Filled 2014-10-08: qty 5

## 2014-10-08 MED ORDER — HEPARIN SOD (PORK) LOCK FLUSH 100 UNIT/ML IV SOLN
500.0000 [IU] | Freq: Once | INTRAVENOUS | Status: AC
  Administered 2014-10-08: 500 [IU] via INTRAVENOUS

## 2014-10-08 MED ORDER — SODIUM CHLORIDE 0.9 % IV SOLN
10.0000 mg/kg | Freq: Once | INTRAVENOUS | Status: AC
  Administered 2014-10-08: 750 mg via INTRAVENOUS
  Filled 2014-10-08: qty 30

## 2014-10-08 MED ORDER — SODIUM CHLORIDE 0.9 % IJ SOLN
10.0000 mL | INTRAMUSCULAR | Status: DC | PRN
Start: 2014-10-08 — End: 2014-10-08
  Filled 2014-10-08: qty 10

## 2014-10-08 MED ORDER — OXYCODONE HCL 15 MG PO TABS: 15.0000 mg | ORAL_TABLET | Freq: Four times a day (QID) | ORAL | Status: DC | PRN

## 2014-10-08 MED ORDER — CYCLOBENZAPRINE HCL 5 MG PO TABS: 5.0000 mg | ORAL_TABLET | Freq: Three times a day (TID) | ORAL | Status: DC | PRN

## 2014-10-08 MED ORDER — SODIUM CHLORIDE 0.9 % IJ SOLN
10.0000 mL | INTRAMUSCULAR | Status: DC | PRN
  Filled 2014-10-08: qty 10

## 2014-10-08 NOTE — Progress Notes (Signed)
Patient does have living will.  Never smoked.  Patient complains of spasms in left flank area over past week that is 10/10.  Requesting refills for 12 mcg Fentanyl, Flexeril and Oxycodone. Patient also states she has been coughing last couple of days and getting up phlegm.  Cannot give me a color of the phlegm.

## 2014-10-11 ENCOUNTER — Encounter: Payer: Self-pay | Admitting: *Deleted

## 2014-10-11 ENCOUNTER — Emergency Department
Admission: EM | Admit: 2014-10-11 | Discharge: 2014-10-11 | Disposition: A | Discharge status: Home / Self Care | Payer: No Typology Code available for payment source | Attending: Emergency Medicine | Admitting: Emergency Medicine

## 2014-10-11 DIAGNOSIS — Z7901 Long term (current) use of anticoagulants: Secondary | ICD-10-CM | POA: Insufficient documentation

## 2014-10-11 DIAGNOSIS — M549 Dorsalgia, unspecified: Secondary | ICD-10-CM | POA: Insufficient documentation

## 2014-10-11 DIAGNOSIS — I10 Essential (primary) hypertension: Secondary | ICD-10-CM | POA: Insufficient documentation

## 2014-10-11 DIAGNOSIS — Z7982 Long term (current) use of aspirin: Secondary | ICD-10-CM | POA: Insufficient documentation

## 2014-10-11 DIAGNOSIS — Z79891 Long term (current) use of opiate analgesic: Secondary | ICD-10-CM | POA: Insufficient documentation

## 2014-10-11 MED ORDER — HYDROMORPHONE HCL 1 MG/ML IJ SOLN
INTRAMUSCULAR | Status: AC
  Administered 2014-10-11: 1 mg via INTRAMUSCULAR
  Filled 2014-10-11: qty 1

## 2014-10-11 MED ORDER — KETOROLAC TROMETHAMINE 60 MG/2ML IM SOLN
INTRAMUSCULAR | Status: AC
  Administered 2014-10-11: 60 mg via INTRAMUSCULAR
  Filled 2014-10-11: qty 2

## 2014-10-11 MED ORDER — KETOROLAC TROMETHAMINE 60 MG/2ML IM SOLN
60.0000 mg | Freq: Once | INTRAMUSCULAR | Status: AC
  Administered 2014-10-11: 60 mg via INTRAMUSCULAR

## 2014-10-11 MED ORDER — HYDROMORPHONE HCL 1 MG/ML IJ SOLN
1.0000 mg | Freq: Once | INTRAMUSCULAR | Status: AC
  Administered 2014-10-11: 1 mg via INTRAMUSCULAR

## 2014-10-11 MED ORDER — ORPHENADRINE CITRATE 30 MG/ML IJ SOLN
INTRAMUSCULAR | Status: AC
  Administered 2014-10-11: 60 mg via INTRAMUSCULAR
  Filled 2014-10-11: qty 2

## 2014-10-11 MED ORDER — ORPHENADRINE CITRATE 30 MG/ML IJ SOLN
60.0000 mg | Freq: Two times a day (BID) | INTRAMUSCULAR | Status: DC
  Administered 2014-10-11: 60 mg via INTRAMUSCULAR

## 2014-10-11 NOTE — ED Notes (Signed)
Pt reports back pain x 1 week. Unknown injury. States lower left side. Worse with any sort of movement. Taking oxycodone at home for pain. Has MRI scheduled on Friday. Hx of pelvic cancer.

## 2014-10-11 NOTE — Discharge Instructions (Signed)
Follow up as needed for pain this weekend.

## 2014-10-11 NOTE — ED Provider Notes (Signed)
[facility] Emergency Department Provider Note  ____________________________________________  Time seen: Approximately 8:24 PM  I have reviewed the triage vital signs and the nursing notes.   HISTORY  Chief Complaint Back Pain    HPI Emily Zamora is a 59 y.o. female complaining of one week of increased back pain. He stated there is no known injury. She states the pain is mostly on the left side increase with any movements. Patient has a history of pelvic cancer and is scheduled for an MRI on Friday. Patient current pain medication is not helping. Patient is currently is taking cyclobenzaprine, and uses a fentanyl patch. Patient denies any bladder or bowel dysfunction. She denies any radicular component to this pain. Patient is rating the pain as a 7/10.   Past Medical History  Diagnosis Date  . Cancer ([place])   . Primary cancer of endometrium ([place]) 05/24/2014  . Hypertension   . Knee pain, bilateral   . Arthritis     Patient Active Problem List   Diagnosis Date Noted  . Right knee pain 08/27/2014  . Primary cancer of endometrium ([place]) 05/24/2014  . Endometrial cancer ([place]) 05/24/2014    Past Surgical History  Procedure Laterality Date  . Gallbladder surgery  2014  . Abdominal hysterectomy      Current Outpatient Rx  Name  Route  Sig  Dispense  Refill  . aspirin 325 MG tablet   Oral   Take 325 mg by mouth daily.         Marland Kitchen atenolol (TENORMIN) 100 MG tablet   Oral   Take 1 tablet (100 mg total) by mouth daily.   30 tablet   5     May get 1 month refill then must be seen before an ...   . citalopram (CELEXA) 40 MG tablet   Oral   Take 1 tablet (40 mg total) by mouth daily.   30 tablet   2   . [facility] SENNA PLUS 8.6-50 MG per tablet      TAKE 1 TABLET BY MOUTH TWICE A DAY   60 tablet   3   . cyclobenzaprine (FLEXERIL) 5 MG tablet   Oral   Take 1 tablet (5 mg total) by mouth 3 (three) times daily as needed for muscle spasms.   30  tablet   3   . famotidine (PEPCID) 20 MG tablet   Oral   Take 1 tablet (20 mg total) by mouth 2 (two) times daily.   60 tablet   5   . fentaNYL (DURAGESIC - DOSED MCG/HR) 12 MCG/HR   Transdermal   Place 1 patch (12.5 mcg total) onto the skin every 3 (three) days. Use with 68mcg patch for total dose of 37.40mcg.   10 patch   0   . FentaNYL 37.5 MCG/HR PT72   Transdermal   Place onto the skin.         Marland Kitchen lidocaine-prilocaine (EMLA) cream   Topical   Apply 1 application topically as needed.   30 g   3   . loratadine (CLARITIN) 10 MG tablet   Oral   Take 1 tablet (10 mg total) by mouth daily.   30 tablet   5   . mirtazapine (REMERON) 15 MG tablet   Oral   Take 1 tablet (15 mg total) by mouth at bedtime.   30 tablet   3   . Multiple Vitamin (MULTIVITAMIN) capsule   Oral   Take by mouth.         Marland Kitchen  ondansetron (ZOFRAN) 4 MG tablet   Oral   Take 4 mg by mouth every 6 (six) hours as needed.      3   . oxyCODONE (ROXICODONE) 15 MG immediate release tablet   Oral   Take 1 tablet (15 mg total) by mouth every 6 (six) hours as needed for pain.   30 tablet   0   . psyllium (METAMUCIL) 58.6 % powder   Oral   Take 1 packet by mouth 3 (three) times daily.         Marland Kitchen warfarin (COUMADIN) 5 MG tablet   Oral   Take 1 tablet (5 mg total) by mouth daily at 6 PM.   30 tablet   1     Allergies Sulfa antibiotics  Family History  Problem Relation Age of Onset  . Breast cancer Sister 101  . Hypertension Sister   . Diabetes Mother   . Hypertension Mother     Social History Social History  Substance Use Topics  . Smoking status: Never Smoker   . Smokeless tobacco: None  . Alcohol Use: No    Review of Systems Constitutional: No fever/chills Eyes: No visual changes. ENT: No sore throat. Cardiovascular: Denies chest pain. Respiratory: Denies shortness of breath. Gastrointestinal: No abdominal pain.  No nausea, no vomiting.  No diarrhea.  No  constipation. Genitourinary: Negative for dysuria. Musculoskeletal: Positive for back pain. Skin: Negative for rash. Neurological: Negative for headaches, focal weakness or numbness. Endocrine:Hypertension Hematological/Lymphatic: Allergic/Immunilogical: Sulfa antibodies  10-point ROS otherwise negative.  ____________________________________________   PHYSICAL EXAM:  VITAL SIGNS: ED Triage Vitals  Enc Vitals Group     BP 10/11/14 1945 134/93 mmHg     Pulse Rate 10/11/14 1945 83     Resp 10/11/14 1945 16     Temp 10/11/14 1945 98 F (36.7 C)     Temp Source 10/11/14 1945 Oral     SpO2 10/11/14 1945 97 %     Weight 10/11/14 1945 176 lb (79.833 kg)     Height 10/11/14 1945 5\' 4"  (1.626 m)     Head Cir --      Peak Flow --      Pain Score 10/11/14 1944 7     Pain Loc --      Pain Edu? --      Excl. in [place]? --     Constitutional: Alert and oriented. Well appearing and in no acute distress. Eyes: Conjunctivae are normal. PERRL. EOMI. Head: Atraumatic. Nose: No congestion/rhinnorhea. Mouth/Throat: Mucous membranes are moist.  Oropharynx non-erythematous. Neck: No stridor. No cervical spine tenderness to palpation. Hematological/Lymphatic/Immunilogical: No cervical lymphadenopathy. Cardiovascular: Normal rate, regular rhythm. Grossly normal heart sounds.  Good peripheral circulation. Respiratory: Normal respiratory effort.  No retractions. Lungs CTAB. Gastrointestinal: Soft and nontender. No distention. No abdominal bruits. No CVA tenderness. Musculoskeletal: No spinal deformity. Moderate guarding palpation L3 and 4. Neurologic:  Normal speech and language. No gross focal neurologic deficits are appreciated. No gait instability. Skin:  Skin is warm, dry and intact. No rash noted. Psychiatric: Mood and affect are normal. Speech and behavior are normal.  ____________________________________________   LABS (all labs ordered are listed, but only abnormal results are  displayed)  Labs Reviewed - No data to display ____________________________________________  EKG   ____________________________________________  RADIOLOGY   ____________________________________________   PROCEDURES  Procedure(s) performed: None  Critical Care performed: No  ____________________________________________   INITIAL IMPRESSION / ASSESSMENT AND PLAN / ED COURSE  Pertinent labs & imaging  results that were available during my care of the patient were reviewed by me and considered in my medical decision making (see chart for details).  Acute low back pain. Moderate relief with Dilaudid, Norflex, and Toradol. Patient advised to continue previous medication and follow her treating doctor on Monday. Patient advised return by ER for condition worsens. ____________________________________________   FINAL CLINICAL IMPRESSION(S) / ED DIAGNOSES  Final diagnoses:  Acute back pain      Sable Feil, PA-C 10/11/14 2131  Earleen Newport, MD 10/11/14 2253

## 2014-10-12 ENCOUNTER — Encounter: Payer: Self-pay | Admitting: Radiology

## 2014-10-12 ENCOUNTER — Emergency Department
Admission: EM | Admit: 2014-10-12 | Discharge: 2014-10-12 | Disposition: A | Discharge status: Home / Self Care | Payer: No Typology Code available for payment source | Attending: Emergency Medicine | Admitting: Emergency Medicine

## 2014-10-12 ENCOUNTER — Emergency Department: Payer: No Typology Code available for payment source

## 2014-10-12 ENCOUNTER — Encounter: Payer: Self-pay | Admitting: Oncology

## 2014-10-12 DIAGNOSIS — Z7982 Long term (current) use of aspirin: Secondary | ICD-10-CM | POA: Diagnosis not present

## 2014-10-12 DIAGNOSIS — G893 Neoplasm related pain (acute) (chronic): Secondary | ICD-10-CM | POA: Diagnosis not present

## 2014-10-12 DIAGNOSIS — R319 Hematuria, unspecified: Secondary | ICD-10-CM | POA: Insufficient documentation

## 2014-10-12 DIAGNOSIS — Z7901 Long term (current) use of anticoagulants: Secondary | ICD-10-CM | POA: Diagnosis not present

## 2014-10-12 DIAGNOSIS — C541 Malignant neoplasm of endometrium: Secondary | ICD-10-CM

## 2014-10-12 DIAGNOSIS — R102 Pelvic and perineal pain: Secondary | ICD-10-CM | POA: Diagnosis present

## 2014-10-12 DIAGNOSIS — Z79899 Other long term (current) drug therapy: Secondary | ICD-10-CM | POA: Diagnosis not present

## 2014-10-12 DIAGNOSIS — I10 Essential (primary) hypertension: Secondary | ICD-10-CM | POA: Diagnosis not present

## 2014-10-12 DIAGNOSIS — M545 Low back pain: Secondary | ICD-10-CM

## 2014-10-12 HISTORY — DX: Disorder of kidney and ureter, unspecified: N28.9

## 2014-10-12 LAB — COMPREHENSIVE METABOLIC PANEL
ALBUMIN: 4 g/dL (ref 3.5–5.0)
ALK PHOS: 89 U/L (ref 38–126)
ALT: 21 U/L (ref 14–54)
AST: 31 U/L (ref 15–41)
Anion gap: 8 (ref 5–15)
BILIRUBIN TOTAL: 1.7 mg/dL — AB (ref 0.3–1.2)
BUN: 25 mg/dL — AB (ref 6–20)
CALCIUM: 9.1 mg/dL (ref 8.9–10.3)
CO2: 27 mmol/L (ref 22–32)
Chloride: 99 mmol/L — ABNORMAL LOW (ref 101–111)
Creatinine, Ser: 1.27 mg/dL — ABNORMAL HIGH (ref 0.44–1.00)
GFR calc Af Amer: 52 mL/min — ABNORMAL LOW (ref >60)
GFR calc non Af Amer: 45 mL/min — ABNORMAL LOW (ref >60)
GLUCOSE: 93 mg/dL (ref 65–99)
POTASSIUM: 3.8 mmol/L (ref 3.5–5.1)
Sodium: 134 mmol/L — ABNORMAL LOW (ref 135–145)
TOTAL PROTEIN: 7.6 g/dL (ref 6.5–8.1)

## 2014-10-12 LAB — URINALYSIS COMPLETE WITH MICROSCOPIC (ARMC ONLY)
Bilirubin Urine: NEGATIVE
GLUCOSE, UA: NEGATIVE mg/dL
Ketones, ur: NEGATIVE mg/dL
LEUKOCYTES UA: NEGATIVE
NITRITE: NEGATIVE
PH: 5 (ref 5.0–8.0)
Protein, ur: NEGATIVE mg/dL
SPECIFIC GRAVITY, URINE: 1.016 (ref 1.005–1.030)

## 2014-10-12 LAB — CBC
HEMATOCRIT: 40.1 % (ref 35.0–47.0)
HEMOGLOBIN: 13.2 g/dL (ref 12.0–16.0)
MCH: 28.8 pg (ref 26.0–34.0)
MCHC: 33 g/dL (ref 32.0–36.0)
MCV: 87.4 fL (ref 80.0–100.0)
Platelets: 181 10*3/uL (ref 150–440)
RBC: 4.59 MIL/uL (ref 3.80–5.20)
RDW: 15.9 % — ABNORMAL HIGH (ref 11.5–14.5)
WBC: 6.1 10*3/uL (ref 3.6–11.0)

## 2014-10-12 LAB — LIPASE, BLOOD: Lipase: 21 U/L — ABNORMAL LOW (ref 22–51)

## 2014-10-12 MED ORDER — HYDROMORPHONE HCL 1 MG/ML IJ SOLN
0.5000 mg | Freq: Once | INTRAMUSCULAR | Status: AC
  Administered 2014-10-12: 0.5 mg via INTRAVENOUS

## 2014-10-12 MED ORDER — IOHEXOL 300 MG/ML  SOLN
80.0000 mL | Freq: Once | INTRAMUSCULAR | Status: AC | PRN
Start: 2014-10-12 — End: 2014-10-12
  Administered 2014-10-12: 80 mL via INTRAVENOUS

## 2014-10-12 MED ORDER — HYDROMORPHONE HCL 1 MG/ML IJ SOLN: 1.0000 mg | Freq: Once | INTRAMUSCULAR | Status: DC

## 2014-10-12 MED ORDER — HYDROMORPHONE HCL 1 MG/ML IJ SOLN
INTRAMUSCULAR | Status: AC
  Administered 2014-10-12: 0.5 mg via INTRAVENOUS
  Filled 2014-10-12: qty 1

## 2014-10-12 MED ORDER — HYDROMORPHONE HCL 1 MG/ML IJ SOLN
1.0000 mg | Freq: Once | INTRAMUSCULAR | Status: AC
  Administered 2014-10-12: 1 mg via INTRAVENOUS

## 2014-10-12 MED ORDER — ONDANSETRON HCL 4 MG/2ML IJ SOLN
4.0000 mg | Freq: Once | INTRAMUSCULAR | Status: AC
  Administered 2014-10-12: 4 mg via INTRAVENOUS

## 2014-10-12 MED ORDER — ONDANSETRON HCL 4 MG/2ML IJ SOLN
INTRAMUSCULAR | Status: AC
Start: 2014-10-12 — End: 2014-10-12
  Administered 2014-10-12: 4 mg via INTRAVENOUS
  Filled 2014-10-12: qty 2

## 2014-10-12 MED ORDER — FENTANYL 12 MCG/HR TD PT72: 12.5000 ug | MEDICATED_PATCH | TRANSDERMAL | Status: DC

## 2014-10-12 MED ORDER — HYDROMORPHONE HCL 1 MG/ML IJ SOLN
INTRAMUSCULAR | Status: AC
  Filled 2014-10-12: qty 1

## 2014-10-12 MED ORDER — IOHEXOL 240 MG/ML SOLN
25.0000 mL | Freq: Once | INTRAMUSCULAR | Status: AC | PRN
  Administered 2014-10-12: 25 mL via ORAL

## 2014-10-12 NOTE — ED Notes (Signed)
Pt reports left back pain/flank pain x 1 week, was seen yesterday here and pain meds have not helped. This am noticed some blood in her urine.

## 2014-10-12 NOTE — Discharge Instructions (Signed)
Pain Medicine Instructions °HOW CAN PAIN MEDICINE AFFECT ME? °You were given a prescription for pain medicine. This medicine may make you tired or drowsy and may affect your ability to think clearly. Pain medicine may also affect your ability to drive or perform certain physical activities. It may not be possible to make all of your pain go away, but you should be comfortable enough to move, breathe, and take care of yourself. °HOW OFTEN SHOULD I TAKE PAIN MEDICINE AND HOW MUCH SHOULD I TAKE? °Take pain medicine only as directed by your health care provider and only as needed for pain. °You do not need to take pain medicine if you are not having pain, unless directed by your health care provider. °You can take less than the prescribed dose if you find that a smaller amount of medicine controls your pain. °WHAT RESTRICTIONS DO I HAVE WHILE TAKING PAIN MEDICINE? °Follow these instructions after you start taking pain medicine, while you are taking the medicine, and for 8 hours after you stop taking the medicine: °Do not drive. °Do not operate machinery. °Do not operate power tools. °Do not sign legal documents. °Do not drink alcohol. °Do not take sleeping pills. °Do not supervise children by yourself. °Do not participate in activities that require climbing or being in high places. °Do not enter a body of water--such as a lake, river, ocean, spa, or swimming pool--without an adult nearby who can monitor and help you. °HOW CAN I KEEP OTHERS SAFE WHILE I AM TAKING PAIN MEDICINE? °Store your pain medicine as directed by your health care provider. Make sure that it is placed where children and pets cannot reach it. °Never share your pain medicine with anyone. °Do not save any leftover pills. If you have any leftover pain medicine, get rid of it or destroy it as directed by your health care provider. °WHAT ELSE DO I NEED TO KNOW ABOUT TAKING PAIN MEDICINE? °Use a stool softener if you become constipated from your pain  medicine. Increasing your intake of fruits and vegetables will also help with constipation. °Write down the times when you take your pain medicine. Look at the times before you take your next dose of medicine. It is easy to become confused while on pain medicine. Recording the times helps you to avoid an overdose. °If your pain is severe, do not try to treat it yourself by taking more pills than instructed on your prescription. Contact your health care provider for help. °You may have been prescribed a pain medicine that contains acetaminophen. Do not take any other acetaminophen while taking this medicine. An overdose of acetaminophen can result in severe liver damage. Acetaminophen is found in many over-the-counter (OTC) and prescription medicines. If you are taking any medicines in addition to your pain medicine, check the active ingredients on those medicines to see if acetaminophen is listed. °WHEN SHOULD I CALL MY HEALTH CARE PROVIDER? °Your medicine is not helping to make the pain go away. °You vomit or have diarrhea shortly after taking the medicine. °You develop new pain in areas that did not hurt before. °You have an allergic reaction to your medicine. This may include: °Itchiness. °Swelling. °Dizziness. °Developing a new rash. °WHEN SHOULD I CALL 911 OR GO TO THE EMERGENCY ROOM? °You feel dizzy or you faint. °You are very confused or disoriented. °You repeatedly vomit. °Your skin or lips turn pale or bluish in color. °You have shortness of breath or you are breathing much more slowly than usual. °You have   a severe allergic reaction to your medicine. This includes: °Developing tongue swelling. °Having difficulty breathing. °  °This information is not intended to replace advice given to you by your health care provider. Make sure you discuss any questions you have with your health care provider. °  °Document Released: 03/28/2000 Document Revised: 05/06/2014 Document Reviewed: 10/24/2013 °Elsevier  Interactive Patient Education ©2016 [employer] ° °

## 2014-10-12 NOTE — ED Notes (Signed)
Patient transported to CT 

## 2014-10-12 NOTE — ED Notes (Signed)
MD Kinner at bedside  

## 2014-10-12 NOTE — ED Provider Notes (Signed)
[facility] Emergency Department Provider Note  ____________________________________________  Time seen: On arrival  I have reviewed the triage vital signs and the nursing notes.   HISTORY  Chief Complaint Back Pain and Hematuria    HPI Emily Zamora is a 59 y.o. female who presents with complaints of left lower pelvic discomfort. 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 Over the last couple of weeks she has had worsening left-sided lower pelvic pain. She saw her oncologist to order an MRI scheduled for this Friday. The pain has worsened somewhat so she presents to the emergency department. She has a fentanyl patch on and is taking oxycodone but continues to have pain she has noticed some dark urine as well and she thinks there may be blood.     Past Medical History  Diagnosis Date  . Cancer ([place])   . Primary cancer of endometrium ([place]) 05/24/2014  . Hypertension   . Knee pain, bilateral   . Arthritis   . Renal insufficiency     Patient Active Problem List   Diagnosis Date Noted  . Right knee pain 08/27/2014  . Primary cancer of endometrium ([place]) 05/24/2014  . Endometrial cancer ([place]) 05/24/2014    Past Surgical History  Procedure Laterality Date  . Gallbladder surgery  2014  . Abdominal hysterectomy      Current Outpatient Rx  Name  Route  Sig  Dispense  Refill  . aspirin 325 MG tablet   Oral   Take 325 mg by mouth daily.         Marland Kitchen atenolol (TENORMIN) 100 MG tablet   Oral   Take 1 tablet (100 mg total) by mouth daily.   30 tablet   5     May get 1 month refill then must be seen before an ...   . citalopram (CELEXA) 40 MG tablet   Oral   Take 1 tablet (40 mg total) by mouth daily.   30 tablet   2   . [facility] SENNA PLUS 8.6-50 MG per tablet      TAKE 1 TABLET BY MOUTH TWICE A DAY   60 tablet   3   . cyclobenzaprine  (FLEXERIL) 5 MG tablet   Oral   Take 1 tablet (5 mg total) by mouth 3 (three) times daily as needed for muscle spasms.   30 tablet   3   . famotidine (PEPCID) 20 MG tablet   Oral   Take 1 tablet (20 mg total) by mouth 2 (two) times daily.   60 tablet   5   . fentaNYL (DURAGESIC - DOSED MCG/HR) 12 MCG/HR   Transdermal   Place 1 patch (12.5 mcg total) onto the skin every 3 (three) days. Use with 32mcg patch for total dose of 37.33mcg.   10 patch   0   . fentaNYL (DURAGESIC - DOSED MCG/HR) 12 MCG/HR   Transdermal   Place 1 patch (12.5 mcg total) onto the skin every 3 (three) days.   5 patch   0   . FentaNYL 37.5 MCG/HR PT72   Transdermal   Place onto the skin.         Marland Kitchen lidocaine-prilocaine (EMLA) cream   Topical   Apply 1 application topically as needed.   30 g   3   . loratadine (CLARITIN) 10 MG tablet   Oral   Take 1  tablet (10 mg total) by mouth daily.   30 tablet   5   . mirtazapine (REMERON) 15 MG tablet   Oral   Take 1 tablet (15 mg total) by mouth at bedtime.   30 tablet   3   . Multiple Vitamin (MULTIVITAMIN) capsule   Oral   Take by mouth.         . ondansetron (ZOFRAN) 4 MG tablet   Oral   Take 4 mg by mouth every 6 (six) hours as needed.      3   . oxyCODONE (ROXICODONE) 15 MG immediate release tablet   Oral   Take 1 tablet (15 mg total) by mouth every 6 (six) hours as needed for pain.   30 tablet   0   . psyllium (METAMUCIL) 58.6 % powder   Oral   Take 1 packet by mouth 3 (three) times daily.         Marland Kitchen warfarin (COUMADIN) 5 MG tablet   Oral   Take 1 tablet (5 mg total) by mouth daily at 6 PM.   30 tablet   1     Allergies Sulfa antibiotics  Family History  Problem Relation Age of Onset  . Breast cancer Sister 80  . Hypertension Sister   . Diabetes Mother   . Hypertension Mother     Social History Social History  Substance Use Topics  . Smoking status: Never Smoker   . Smokeless tobacco: None  . Alcohol Use: No     Review of Systems  Constitutional: Negative for fever. Eyes: Negative for visual changes. ENT: Negative for sore throat Cardiovascular: Negative for chest pain. Respiratory: Negative for shortness of breath. Gastrointestinal: Lower pelvic pain Genitourinary: Negative for dysuria. Positive hematuria Musculoskeletal: Negative for back pain. Skin: Negative for rash. Neurological: Negative for headaches or focal weakness Psychiatric: No anxiety    ____________________________________________   PHYSICAL EXAM:  VITAL SIGNS: ED Triage Vitals  Enc Vitals Group     BP 10/12/14 1645 105/73 mmHg     Pulse Rate 10/12/14 1645 72     Resp 10/12/14 1645 18     Temp 10/12/14 1645 98.3 F (36.8 C)     Temp Source 10/12/14 1645 Oral     SpO2 10/12/14 1645 96 %     Weight 10/12/14 1645 176 lb (79.833 kg)     Height 10/12/14 1645 5\' 4"  (1.626 m)     Head Cir --      Peak Flow --      Pain Score 10/12/14 1646 7     Pain Loc --      Pain Edu? --      Excl. in [place]? --      Constitutional: Alert and oriented. Well appearing and in no distress. Eyes: Conjunctivae are normal.  ENT   Head: Normocephalic and atraumatic.   Mouth/Throat: Mucous membranes are moist. Cardiovascular: Normal rate, regular rhythm. Normal and symmetric distal pulses are present in all extremities. No murmurs, rubs, or gallops. Respiratory: Normal respiratory effort without tachypnea nor retractions. Breath sounds are clear and equal bilaterally.  Gastrointestinal: Soft and non-tender in all quadrants. No distention. There is no CVA tenderness. Genitourinary: deferred Musculoskeletal: Nontender with normal range of motion in all extremities. No lower extremity tenderness nor edema. Neurologic:  Normal speech and language. No gross focal neurologic deficits are appreciated. Skin:  Skin is warm, dry and intact. No rash noted. Psychiatric: Mood and affect are normal. Patient exhibits appropriate insight  and  judgment.  ____________________________________________    LABS (pertinent positives/negatives)  Labs Reviewed  URINALYSIS COMPLETEWITH MICROSCOPIC ([place] ONLY) - Abnormal; Notable for the following:    Color, Urine YELLOW (*)    APPearance CLEAR (*)    Hgb urine dipstick 1+ (*)    Bacteria, UA RARE (*)    Squamous Epithelial / LPF 0-5 (*)    All other components within normal limits  CBC - Abnormal; Notable for the following:    RDW 15.9 (*)    All other components within normal limits  COMPREHENSIVE METABOLIC PANEL - Abnormal; Notable for the following:    Sodium 134 (*)    Chloride 99 (*)    BUN 25 (*)    Creatinine, Ser 1.27 (*)    Total Bilirubin 1.7 (*)    GFR calc non Af Amer 45 (*)    GFR calc Af Amer 52 (*)    All other components within normal limits  LIPASE, BLOOD - Abnormal; Notable for the following:    Lipase 21 (*)    All other components within normal limits    ____________________________________________   EKG  None  ____________________________________________    RADIOLOGY I have personally reviewed any xrays that were ordered on this patient: CT scan shows worsening lymphadenopathy  ____________________________________________   PROCEDURES  Procedure(s) performed: none  Critical Care performed:none  ____________________________________________   INITIAL IMPRESSION / ASSESSMENT AND PLAN / ED COURSE  Pertinent labs & imaging results that were available during my care of the patient were reviewed by me and considered in my medical decision making (see chart for details).  Patient given dilaudid for pain control. Labs unremarkable, CT findings concerning for worsening disease.  Discussed CT results with Dr. Ma Hillock of oncology.He recommends increased pain medication and follow up tomorrow in the Cancer center  D/w patient and she agrees with plan. She will follow up with Dr. Oliva Bustard  tomorrow.    ____________________________________________   FINAL CLINICAL IMPRESSION(S) / ED DIAGNOSES  Final diagnoses:  Cancer associated pain     Lavonia Drafts, MD 10/12/14 2121

## 2014-10-12 NOTE — Progress Notes (Signed)
[place] @ [facility] Telephone:([phone]  Fax:(336) [place]: February 02, 1955  MR#: [id number]  KWI#:[id number]  Patient Care Team: Ashok Norris, MD as PCP - General (Family Medicine)  CHIEF COMPLAINT:  Chief Complaint  Patient presents with  . 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 abdominal paracentesis x1 negative for malignant cells(22nd September)  2.PET scan shows extensive disease with lymph node metastases and bone metastases T1 N0 M1 disease biopsy is consistent with adenocarcinoma GYN or lesion.  Most likely from the endometrial cancer.  Estrogen receptor positive.  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 abdominal paracentesis x1 negative for malignant cells(22nd September)  2.PET scan shows extensive disease with lymph node metastases and bone metastases T1 N0 M1 disease biopsy is consistent with adenocarcinoma GYN or lesion.  Most likely from the endometrial cancer.  Estrogen receptor positive.  HER-2/neu receptor pending(October, 2015)  3.patient started on chemotherapy with carboplatinum and Taxolfrom November 06, 2013 4.acute pulmonary embolism February of 2016 on xeralto. 5.  Because of insurance not paying for [place] patient has been switched over to Coumadin (May, 2016) 6.   Now on maintenance a Avastin (April, 2016)     Primary cancer of endometrium ([place])   10/23/2012 Initial Diagnosis Primary cancer of endometrium    Endometrial cancer ([place])   05/24/2014 Initial Diagnosis Endometrial cancer    Oncology Flowsheet 06/03/2014 06/24/2014 07/15/2014 08/05/2014 08/27/2014 09/17/2014 10/08/2014  bevacizumab (AVASTIN) IV 10 mg/kg 10 mg/kg 10 mg/kg 10 mg/kg 10 mg/kg 10 mg/kg 10 mg/kg    INTERVAL HISTORY: Extremely-year-old lady with stage IV carcinoma of endometrium and has finished total 8cycles of chemotherapy with carboplatinum Taxol and Avastin.  Now on maintenance a Avastin therapy.  Abdominal pain has improved.  Patient is less depressed.  Celexa was increased to 40 mg daily. Here for further follow-up and treatment consideration Because of abdominal pain is improving with decrease fentanyl patch to 50 g. Appetite is improving.  No nausea.  No vomiting.  No diarrhea. October 08, 2014 Patient is here for ongoing evaluation and consideration of maintenance avastin therapy Complaints of low back pain for the last few weeks.  Gradually getting worse.  Patient did have bone metastases.  Patient also has a history of pulmonary embolism as well as hypertension.  A Pain is constant dull aching.    REVIEW OF SYSTEMS:   Gen. status: Slightly apprehensive.  Increasing low back pain constant dull aching. On Coumadin no evidence of rectal bleeding Dose was decreased to 5 mg daily Lower extremities no swelling All other systems have been reviewed (total 12 systems have been reviewed)  As per HPI. Otherwise, a complete review of  systems is negatve.  PAST MEDICAL HISTORY: Past Medical History  Diagnosis Date  . Cancer ([place])   . Primary cancer of endometrium ([place]) 05/24/2014  . Hypertension   . Knee pain, bilateral   . Arthritis     Significant History/PMH:   Stage 4 peritoneal Cancer:    Cirrhosis:    GERD:    endometerial cancer:    Arthritis:      Hypertension:    Migraines:    Hysterectomy:    Cholecystectomy:    Dilation and Curretage: with hysteroscopy, Aug 2012 FAMILY HISTORY Family History  Problem Relation Age of Onset  . Breast cancer Sister 15  . Hypertension Sister   . Diabetes Mother   . Hypertension Mother       ADVANCED DIRECTIVES:does have advanced healthcare directive   HEALTH MAINTENANCE: Social History  Substance Use Topics  . Smoking status: Never Smoker   . Smokeless tobacco: None  . Alcohol Use: No    :  Allergies  Allergen Reactions  . Sulfa Antibiotics Anaphylaxis    Current Outpatient Prescriptions  Medication Sig Dispense Refill  . aspirin 325 MG tablet Take 325 mg by mouth daily.    Marland Kitchen atenolol (TENORMIN) 100 MG tablet Take 1 tablet (100 mg total) by mouth daily. 30 tablet 5  . citalopram (CELEXA) 40 MG tablet Take 1 tablet (40 mg total) by mouth daily. 30 tablet 2  . [facility] SENNA PLUS 8.6-50 MG per tablet TAKE 1 TABLET BY MOUTH TWICE A DAY 60 tablet 3  . cyclobenzaprine (FLEXERIL) 5 MG tablet Take 1 tablet (5 mg total) by mouth 3 (three) times daily as needed for muscle spasms. 30 tablet 3  . famotidine (PEPCID) 20 MG tablet Take 1 tablet (20 mg total) by mouth 2 (two) times daily. 60 tablet 5  . FentaNYL 37.5 MCG/HR PT72 Place onto the skin.    Marland Kitchen lidocaine-prilocaine (EMLA) cream Apply 1 application topically as needed. 30 g 3  . loratadine (CLARITIN) 10 MG tablet Take 1 tablet (10 mg total) by mouth daily. 30 tablet 5  . mirtazapine (REMERON) 15 MG tablet Take 1 tablet (15 mg total) by mouth at bedtime. 30 tablet 3  . Multiple Vitamin (MULTIVITAMIN) capsule Take by mouth.    . ondansetron (ZOFRAN) 4 MG tablet Take 4 mg by mouth every 6 (six) hours as needed.  3  . oxyCODONE (ROXICODONE) 15 MG immediate release tablet Take 1 tablet (15 mg total) by mouth every 6 (six) hours as needed for pain. 30 tablet 0  . psyllium (METAMUCIL) 58.6 % powder Take 1 packet by mouth 3 (three) times  daily.    Marland Kitchen warfarin (COUMADIN) 5 MG tablet Take 1 tablet (5 mg total) by mouth daily at 6 PM. 30 tablet 1  . fentaNYL (DURAGESIC - DOSED MCG/HR) 12 MCG/HR Place 1 patch (12.5 mcg total) onto the skin every 3 (three) days. Use with 51mg patch for total dose of 37.541m. 10 patch 0   No current facility-administered medications for this visit.    OBJECTIVE:  Filed Vitals:   10/08/14 1015  BP: 120/80  Pulse: 81  Temp: 96.4 F (35.8 C)     Body mass index is 30.52 kg/(m^2).    ECOG FS:1 - Symptomatic but completely ambulatory  PHYSICAL EXAM: GENERAL:  Well developed, well nourished, sitting comfortably in the exam room in no acute distress. MENTAL STATUS:  Alert and oriented to person, place and time. HEAD:  Alopecia.  Normocephalic, atraumatic, face symmetric,  no Cushingoid features. EYES: pale  Pupils equal round and reactive to light and accomodation.  No conjunctivitis or scleral icterus. ENT:  Oropharynx clear without lesion.  Tongue normal. Mucous membranes moist.  RESPIRATORY:  Clear to auscultation without rales, wheezes or rhonchi. CARDIOVASCULAR:  Regular rate and rhythm without murmur, rub or gallop. . ABDOMEN:  Soft, non-tender, with active bowel sounds, and no hepatosplenomegaly.  No masses. Tenderness in lower abdominal area.  No palpable masses. BACK: Tenderness in the low back area.  No swelling.  No deformity. SKIN:  No rashes, ulcers or lesions. EXTREMITIES: No edema, no skin discoloration or tenderness.  No palpable cords. LYMPH NODES: No palpable cervical, supraclavicular, axillary or inguinal adenopathy  NEUROLOGICAL: Unremarkable. PSYCH:  Less depressed LAB RESULTS:  Infusion on 10/08/2014  Component Date Value Ref Range Status  . WBC 10/08/2014 6.6  3.6 - 11.0 K/uL Final  . RBC 10/08/2014 4.32  3.80 - 5.20 MIL/uL Final  . Hemoglobin 10/08/2014 12.6  12.0 - 16.0 g/dL Final  . HCT 10/08/2014 38.1  35.0 - 47.0 % Final  . MCV 10/08/2014 88.1  80.0 - 100.0 fL  Final  . MCH 10/08/2014 29.1  26.0 - 34.0 pg Final  . MCHC 10/08/2014 33.0  32.0 - 36.0 g/dL Final  . RDW 10/08/2014 15.7* 11.5 - 14.5 % Final  . Platelets 10/08/2014 183  150 - 440 K/uL Final  . Neutrophils Relative % 10/08/2014 62   Final  . Neutro Abs 10/08/2014 4.1  1.4 - 6.5 K/uL Final  . Lymphocytes Relative 10/08/2014 25   Final  . Lymphs Abs 10/08/2014 1.7  1.0 - 3.6 K/uL Final  . Monocytes Relative 10/08/2014 8   Final  . Monocytes Absolute 10/08/2014 0.5  0.2 - 0.9 K/uL Final  . Eosinophils Relative 10/08/2014 4   Final  . Eosinophils Absolute 10/08/2014 0.3  0 - 0.7 K/uL Final  . Basophils Relative 10/08/2014 1   Final  . Basophils Absolute 10/08/2014 0.0  0 - 0.1 K/uL Final  . Sodium 10/08/2014 135  135 - 145 mmol/L Final  . Potassium 10/08/2014 4.1  3.5 - 5.1 mmol/L Final  . Chloride 10/08/2014 102  101 - 111 mmol/L Final  . CO2 10/08/2014 28  22 - 32 mmol/L Final  . Glucose, Bld 10/08/2014 136* 65 - 99 mg/dL Final  . BUN 10/08/2014 18  6 - 20 mg/dL Final  . Creatinine, Ser 10/08/2014 1.21* 0.44 - 1.00 mg/dL Final  . Calcium 10/08/2014 8.2* 8.9 - 10.3 mg/dL Final  . Total Protein 10/08/2014 6.7  6.5 - 8.1 g/dL Final  . Albumin 10/08/2014 3.6  3.5 - 5.0 g/dL Final  . AST 10/08/2014 28  15 - 41 U/L Final  . ALT 10/08/2014 20  14 - 54 U/L Final  . Alkaline Phosphatase 10/08/2014 76  38 - 126 U/L Final  . Total Bilirubin 10/08/2014 0.6  0.3 - 1.2 mg/dL Final  . GFR calc non Af Amer 10/08/2014 47* >60 mL/min Final  . GFR calc Af Amer 10/08/2014 55* >60 mL/min Final   Comment: (NOTE) The eGFR has been calculated using the CKD EPI equation. This calculation has not been validated in all clinical situations. eGFR's persistently <60 mL/min signify possible Chronic Kidney Disease.   . Anion gap 10/08/2014 5  5 - 15 Final  . Magnesium 10/08/2014 1.7  1.7 - 2.4 mg/dL Final  . Prothrombin Time 10/08/2014 22.0* 11.4 - 15.0 seconds Final  . INR 10/08/2014 1.91   Final  .  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  Patient is on Coumadin pro time will be checked.   On clinical examination there is no evidence of recurrent disease  Markers are stable  MEDICAL DECISION MAKING:  We will continue Avastin at present time. Low back pain except etiology is not clear will proceed with MRI scan of lumbosacral spine patient did have metastases to the bone. Consider resuming XGEVA treatment CA-125 is 23.9 Anticoagulation management Pro time with INR of 1.91 Continue 5 mg of Coumadin daily Hypertension Continue antihypertensive medication most likely secondary to Avastin therapy Continue Avastin. Tumor reassessment Tumor markers are stable Increasing back pain is bothersome MRI scan has been ordered     Patient expressed understanding and was in agreement with this plan. She also understands that She can call clinic at any time with any questions, concerns, or complaints.    Primary cancer of endometrium   Staging form: Corpus Uteri - Carcinoma, AJCC 7th Edition     Clinical: Stage IVB (T1a, N0, M1) - Signed by Forest Gleason, MD on 05/24/2014     Pathologic: No stage assigned - Marni Griffon, MD   10/12/2014 9:04 AM

## 2014-10-12 NOTE — ED Notes (Signed)
Pt returned from ct at this time

## 2014-10-13 ENCOUNTER — Telehealth: Payer: Self-pay | Admitting: *Deleted

## 2014-10-13 DIAGNOSIS — M545 Low back pain: Secondary | ICD-10-CM

## 2014-10-13 DIAGNOSIS — C541 Malignant neoplasm of endometrium: Secondary | ICD-10-CM

## 2014-10-13 MED ORDER — OXYCODONE HCL 15 MG PO TABS: 30.0000 mg | ORAL_TABLET | Freq: Four times a day (QID) | ORAL | Status: DC | PRN

## 2014-10-13 MED ORDER — FENTANYL 25 MCG/HR TD PT72: 25.0000 ug | MEDICATED_PATCH | TRANSDERMAL | Status: DC

## 2014-10-13 NOTE — Telephone Encounter (Signed)
Called to report she had to go to ER twice over weekend for severe left lower back pain. She was given dilaudid inj and they upped her Fentanyl to 37.5 mcg

## 2014-10-13 NOTE — Telephone Encounter (Signed)
States her pain is uncontrolled and she is on fentanyl 62 mcg, Oxycodone 15 q 6 h Asking if we have goten in touch with Dr Oliva Bustard yet. I spoke with L Herring AGNP-C and she advised we increase her Oxycodone to 2 tabs  And see Dr Oliva Bustard on Wed. Pat informed and agrees to increase Oxycodone dose to 2 tabs and will wait and see if that helps her pain before scheduling an appt for Wednesday. She stated she will call back if not better

## 2014-10-15 ENCOUNTER — Inpatient Hospital Stay (HOSPITAL_BASED_OUTPATIENT_CLINIC_OR_DEPARTMENT_OTHER): Payer: No Typology Code available for payment source | Admitting: Oncology

## 2014-10-15 ENCOUNTER — Inpatient Hospital Stay: Payer: No Typology Code available for payment source

## 2014-10-15 VITALS — BP 133/97 | HR 78 | Temp 97.2°F | Wt 175.7 lb

## 2014-10-15 DIAGNOSIS — I2699 Other pulmonary embolism without acute cor pulmonale: Secondary | ICD-10-CM

## 2014-10-15 DIAGNOSIS — M545 Low back pain: Secondary | ICD-10-CM | POA: Diagnosis not present

## 2014-10-15 DIAGNOSIS — Z79899 Other long term (current) drug therapy: Secondary | ICD-10-CM

## 2014-10-15 DIAGNOSIS — Z170 Estrogen receptor positive status [ER+]: Secondary | ICD-10-CM

## 2014-10-15 DIAGNOSIS — K219 Gastro-esophageal reflux disease without esophagitis: Secondary | ICD-10-CM

## 2014-10-15 DIAGNOSIS — M25562 Pain in left knee: Secondary | ICD-10-CM

## 2014-10-15 DIAGNOSIS — I10 Essential (primary) hypertension: Secondary | ICD-10-CM

## 2014-10-15 DIAGNOSIS — C541 Malignant neoplasm of endometrium: Secondary | ICD-10-CM | POA: Diagnosis not present

## 2014-10-15 DIAGNOSIS — C779 Secondary and unspecified malignant neoplasm of lymph node, unspecified: Secondary | ICD-10-CM | POA: Diagnosis not present

## 2014-10-15 DIAGNOSIS — M25561 Pain in right knee: Secondary | ICD-10-CM

## 2014-10-15 DIAGNOSIS — R188 Other ascites: Secondary | ICD-10-CM

## 2014-10-15 DIAGNOSIS — Z9049 Acquired absence of other specified parts of digestive tract: Secondary | ICD-10-CM

## 2014-10-15 DIAGNOSIS — Z8669 Personal history of other diseases of the nervous system and sense organs: Secondary | ICD-10-CM

## 2014-10-15 DIAGNOSIS — K7460 Unspecified cirrhosis of liver: Secondary | ICD-10-CM

## 2014-10-15 DIAGNOSIS — Z7982 Long term (current) use of aspirin: Secondary | ICD-10-CM

## 2014-10-15 DIAGNOSIS — R791 Abnormal coagulation profile: Secondary | ICD-10-CM

## 2014-10-15 MED ORDER — HEPARIN SOD (PORK) LOCK FLUSH 100 UNIT/ML IV SOLN
INTRAVENOUS | Status: AC
  Filled 2014-10-15: qty 5

## 2014-10-15 MED ORDER — KETOROLAC TROMETHAMINE 15 MG/ML IJ SOLN
15.0000 mg | Freq: Once | INTRAMUSCULAR | Status: AC
Start: 2014-10-15 — End: 2014-10-15
  Administered 2014-10-15: 15 mg via INTRAVENOUS

## 2014-10-15 MED ORDER — SODIUM CHLORIDE 0.9 % IJ SOLN
10.0000 mL | INTRAMUSCULAR | Status: DC | PRN
  Administered 2014-10-15: 10 mL via INTRAVENOUS
  Filled 2014-10-15: qty 10

## 2014-10-15 MED ORDER — OXYCODONE HCL 15 MG PO TABS: 30.0000 mg | ORAL_TABLET | ORAL | Status: DC | PRN

## 2014-10-15 MED ORDER — FENTANYL 75 MCG/HR TD PT72: 75.0000 ug | MEDICATED_PATCH | TRANSDERMAL | Status: DC

## 2014-10-15 MED ORDER — HEPARIN SOD (PORK) LOCK FLUSH 10 UNIT/ML IV SOLN
10.0000 [IU] | Freq: Once | INTRAVENOUS | Status: AC
  Administered 2014-10-15: 10 [IU] via INTRAVENOUS

## 2014-10-15 MED ORDER — DEXAMETHASONE SODIUM PHOSPHATE 100 MG/10ML IJ SOLN
8.0000 mg | Freq: Once | INTRAMUSCULAR | Status: AC
  Administered 2014-10-15: 8 mg via INTRAVENOUS
  Filled 2014-10-15: qty 0.8

## 2014-10-15 MED ORDER — PREDNISONE 10 MG PO TABS: ORAL_TABLET | ORAL | Status: DC

## 2014-10-15 NOTE — Progress Notes (Signed)
Patient seen in ED Saturday and Sunday for pain.  States she is hurting in lower back and into abdomen this morning.  CT was done which revealed swollen lymph nodes.  Pain meds were increased.  She states she has very little urine output despite drinking plenty of water due to her mouth being dry.  Placed yellow bracelet on patient.  Educated patient to wear it to all appointment including lab only appts.

## 2014-10-16 ENCOUNTER — Ambulatory Visit
Admission: RE | Admit: 2014-10-16 | Discharge: 2014-10-16 | Disposition: A | Discharge status: Home / Self Care | Payer: No Typology Code available for payment source | Source: Ambulatory Visit | Attending: Oncology | Admitting: Oncology

## 2014-10-16 ENCOUNTER — Telehealth: Payer: Self-pay | Admitting: *Deleted

## 2014-10-16 DIAGNOSIS — M4804 Spinal stenosis, thoracic region: Secondary | ICD-10-CM | POA: Diagnosis not present

## 2014-10-16 DIAGNOSIS — C541 Malignant neoplasm of endometrium: Secondary | ICD-10-CM

## 2014-10-16 DIAGNOSIS — R591 Generalized enlarged lymph nodes: Secondary | ICD-10-CM | POA: Diagnosis not present

## 2014-10-16 DIAGNOSIS — M545 Low back pain: Secondary | ICD-10-CM | POA: Diagnosis present

## 2014-10-16 DIAGNOSIS — C7951 Secondary malignant neoplasm of bone: Secondary | ICD-10-CM | POA: Insufficient documentation

## 2014-10-16 DIAGNOSIS — Z8542 Personal history of malignant neoplasm of other parts of uterus: Secondary | ICD-10-CM | POA: Diagnosis not present

## 2014-10-16 MED ORDER — GADOBENATE DIMEGLUMINE 529 MG/ML IV SOLN
20.0000 mL | Freq: Once | INTRAVENOUS | Status: AC | PRN
  Administered 2014-10-16: 15 mL via INTRAVENOUS

## 2014-10-16 NOTE — Telephone Encounter (Signed)
Report printed for MD and given to him on 10/16/14 @ 1345.

## 2014-10-17 ENCOUNTER — Inpatient Hospital Stay: Payer: No Typology Code available for payment source

## 2014-10-17 DIAGNOSIS — M545 Low back pain: Secondary | ICD-10-CM

## 2014-10-17 DIAGNOSIS — C541 Malignant neoplasm of endometrium: Secondary | ICD-10-CM

## 2014-10-17 MED ORDER — HEPARIN SOD (PORK) LOCK FLUSH 100 UNIT/ML IV SOLN
500.0000 [IU] | Freq: Once | INTRAVENOUS | Status: AC
  Administered 2014-10-17: 500 [IU] via INTRAVENOUS
  Filled 2014-10-17: qty 5

## 2014-10-17 MED ORDER — KETOROLAC TROMETHAMINE 15 MG/ML IJ SOLN
15.0000 mg | Freq: Once | INTRAMUSCULAR | Status: AC
  Administered 2014-10-17: 15 mg via INTRAVENOUS
  Filled 2014-10-17: qty 1

## 2014-10-17 MED ORDER — SODIUM CHLORIDE 0.9 % IJ SOLN
10.0000 mL | INTRAMUSCULAR | Status: DC | PRN
  Administered 2014-10-17: 10 mL
  Filled 2014-10-17: qty 10

## 2014-10-19 ENCOUNTER — Encounter: Payer: Self-pay | Admitting: Oncology

## 2014-10-19 NOTE — Progress Notes (Signed)
[place] @ [facility] [phone]) [phone]  Fax:(336) [place]: 01/05/1955  MR#: [id number]  DUK#:[id number]  Patient Care Team: Ashok Norris, MD as PCP - General (Family Medicine)  CHIEF COMPLAINT:  Chief Complaint  Patient presents with  . 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 abdominal paracentesis x1 negative for malignant cells(22nd September)  2.PET scan shows extensive disease with lymph node metastases and bone metastases T1 N0 M1 disease biopsy is consistent with adenocarcinoma GYN or lesion.  Most likely from the endometrial cancer.  Estrogen receptor positive.  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 abdominal paracentesis x1 negative for malignant cells(22nd September)  2.PET scan shows extensive disease with lymph node metastases and bone metastases T1 N0 M1 disease biopsy is consistent with adenocarcinoma GYN or lesion.  Most likely from the endometrial cancer.  Estrogen receptor positive.  HER-2/neu receptor pending(October, 2015)  3.patient started on chemotherapy with carboplatinum and Taxolfrom November 06, 2013 4.acute pulmonary embolism February of 2016 on xeralto. 5.  Because of insurance not paying for [place] patient has been switched over to Coumadin (May, 2016) 6.  Now on maintenance a  Avastin (April, 2016)   6.  Progressing disease by CT scan and MRI scan (October, 2016)    Oncology Flowsheet 07/15/2014 08/05/2014 08/27/2014 09/17/2014 10/08/2014 10/12/2014 10/15/2014  bevacizumab (AVASTIN) IV 10 mg/kg 10 mg/kg 10 mg/kg 10 mg/kg 10 mg/kg - -  dexamethasone (DECADRON) IV - - - - - - 8 mg  ondansetron (ZOFRAN) IV - - - - - 4 mg -    INTERVAL HISTORY:  59 year old lady with stage IV recurrent carcinoma of endometrium having increasing pain in low back area pain radiating down to left lower extremity.  Since last evaluation patient had been in the emergency room twice.  All the records from the emergency room and been reviewed. Patient had a CT scan of abdomen done which revealed increase in the size of the retroperitoneal lymph node Pain is constant dull aching and throbbing present pain medication does not help.  The pain Appetite is poor Patient is in wheelchair   REVIEW OF SYSTEMS:    general status: Patient is feeling weak and tired.  No change in a performance status.  No chills.  No fever. HEENT:   No evidence of stomatitis Lungs: No cough or shortness of breath Cardiac: No chest pain or paroxysmal nocturnal dyspnea GI: No nausea no vomiting no diarrhea no abdominal pain Skin: No rash Lower extremity no swelling Neurological system: No tingling.  No numbness.  No other focal signs Low back pain and pain radiating down to lower extremity.  Constant dull aching throbbing pain All other systems reviewed as  per history of present illness  PAST MEDICAL HISTORY: Past Medical History  Diagnosis Date  . Cancer ([place])   . Primary cancer of endometrium ([place]) 05/24/2014  . Hypertension   . Knee pain, bilateral   . Arthritis   . Renal insufficiency     Significant History/PMH:   Stage 4 peritoneal Cancer:    Cirrhosis:    GERD:    endometerial cancer:    Arthritis:    Hypertension:    Migraines:    Hysterectomy:    Cholecystectomy:    Dilation and  Curretage: with hysteroscopy, Aug 2012 FAMILY HISTORY Family History  Problem Relation Age of Onset  . Breast cancer Sister 32  . Hypertension Sister   . Diabetes Mother   . Hypertension Mother       ADVANCED DIRECTIVES:does have advanced healthcare directive   HEALTH MAINTENANCE: Social History  Substance Use Topics  . Smoking status: Never Smoker   . Smokeless tobacco: None  . Alcohol Use: No    :  Allergies  Allergen Reactions  . Sulfa Antibiotics Anaphylaxis    Current Outpatient Prescriptions  Medication Sig Dispense Refill  . aspirin 325 MG tablet Take 325 mg by mouth daily.    Marland Kitchen atenolol (TENORMIN) 100 MG tablet Take 1 tablet (100 mg total) by mouth daily. 30 tablet 5  . citalopram (CELEXA) 40 MG tablet Take 1 tablet (40 mg total) by mouth daily. 30 tablet 2  . [facility] SENNA PLUS 8.6-50 MG per tablet TAKE 1 TABLET BY MOUTH TWICE A DAY 60 tablet 3  . cyclobenzaprine (FLEXERIL) 5 MG tablet Take 1 tablet (5 mg total) by mouth 3 (three) times daily as needed for muscle spasms. 30 tablet 3  . famotidine (PEPCID) 20 MG tablet Take 1 tablet (20 mg total) by mouth 2 (two) times daily. 60 tablet 5  . lidocaine-prilocaine (EMLA) cream Apply 1 application topically as needed. 30 g 3  . loratadine (CLARITIN) 10 MG tablet Take 1 tablet (10 mg total) by mouth daily. 30 tablet 5  . mirtazapine (REMERON) 15 MG tablet Take 1 tablet (15 mg total) by mouth at bedtime. 30 tablet 3  . Multiple Vitamin (MULTIVITAMIN) capsule Take by mouth.    . ondansetron (ZOFRAN) 4 MG tablet Take 4 mg by mouth every 6 (six) hours as needed.  3  . psyllium (METAMUCIL) 58.6 % powder Take 1 packet by mouth 3 (three) times daily.    Marland Kitchen warfarin (COUMADIN) 5 MG tablet Take 1 tablet (5 mg total) by mouth daily at 6 PM. 30 tablet 1  . fentaNYL (DURAGESIC - DOSED MCG/HR) 75 MCG/HR Place 1 patch (75 mcg total) onto the skin every 3 (three) days. 10 patch 0  . oxyCODONE (ROXICODONE) 15 MG immediate release tablet  Take 2 tablets (30 mg total) by mouth every 2 (two) hours as needed for pain. 60 tablet 0  . predniSONE (DELTASONE) 10 MG tablet Take 6 tabs po x 1 day, take 5 tabs po x 1 day, take 4 tabs po x 1 day, take 3 tabs po x 1 day, take 2 tabs po x 1 day, take 1 tab po x 1 day, then stop 21 tablet 0   No current facility-administered medications for this visit.    OBJECTIVE:  Filed Vitals:   10/15/14 0904  BP: 133/97  Pulse: 78  Temp: 97.2 F (36.2 C)     Body mass index is 30.15 kg/(m^2).    ECOG FS:1 - Symptomatic but completely  ambulatory  PHYSICAL EXAM: GENERAL:  Well developed, well nourished, sitting comfortably in the exam room in no acute distress. MENTAL STATUS:  Alert and oriented to person, place and time. HEAD:  Alopecia.  Normocephalic, atraumatic, face symmetric, no Cushingoid features. EYES: pale  Pupils equal round and reactive to light and accomodation.  No conjunctivitis or scleral icterus. ENT:  Oropharynx clear without lesion.  Tongue normal. Mucous membranes moist.  RESPIRATORY:  Clear to auscultation without rales, wheezes or rhonchi. CARDIOVASCULAR:  Regular rate and rhythm without murmur, rub or gallop. . ABDOMEN:  Soft, non-tender, with active bowel sounds, and no hepatosplenomegaly.  No masses. Tenderness in lower abdominal area.  No palpable masses. BACK: Tenderness in the low back area.  No swelling.  No deformity. SKIN:  No rashes, ulcers or lesions. EXTREMITIES: No edema, no skin discoloration or tenderness.  No palpable cords. LYMPH NODES: No palpable cervical, supraclavicular, axillary or inguinal adenopathy  NEUROLOGICAL: Unremarkable. PSYCH:  Less depressed LAB RESULTS:  No visits with results within 3 Day(s) from this visit. Latest known visit with results is:  Admission on 10/12/2014, Discharged on 10/12/2014  Component Date Value Ref Range Status  . Color, Urine 10/12/2014 YELLOW* YELLOW Final  . APPearance 10/12/2014 CLEAR* CLEAR Final  .  Glucose, UA 10/12/2014 NEGATIVE  NEGATIVE mg/dL Final  . Bilirubin Urine 10/12/2014 NEGATIVE  NEGATIVE Final  . Ketones, ur 10/12/2014 NEGATIVE  NEGATIVE mg/dL Final  . Specific Gravity, Urine 10/12/2014 1.016  1.005 - 1.030 Final  . Hgb urine dipstick 10/12/2014 1+* NEGATIVE Final  . pH 10/12/2014 5.0  5.0 - 8.0 Final  . Protein, ur 10/12/2014 NEGATIVE  NEGATIVE mg/dL Final  . Nitrite 10/12/2014 NEGATIVE  NEGATIVE Final  . Leukocytes, UA 10/12/2014 NEGATIVE  NEGATIVE Final  . RBC / HPF 10/12/2014 0-5  0 - 5 RBC/hpf Final  . WBC, UA 10/12/2014 0-5  0 - 5 WBC/hpf Final  . Bacteria, UA 10/12/2014 RARE* NONE SEEN Final  . Squamous Epithelial / LPF 10/12/2014 0-5* NONE SEEN Final  . Mucous 10/12/2014 PRESENT   Final  . WBC 10/12/2014 6.1  3.6 - 11.0 K/uL Final  . RBC 10/12/2014 4.59  3.80 - 5.20 MIL/uL Final  . Hemoglobin 10/12/2014 13.2  12.0 - 16.0 g/dL Final  . HCT 10/12/2014 40.1  35.0 - 47.0 % Final  . MCV 10/12/2014 87.4  80.0 - 100.0 fL Final  . MCH 10/12/2014 28.8  26.0 - 34.0 pg Final  . MCHC 10/12/2014 33.0  32.0 - 36.0 g/dL Final  . RDW 10/12/2014 15.9* 11.5 - 14.5 % Final  . Platelets 10/12/2014 181  150 - 440 K/uL Final  . Sodium 10/12/2014 134* 135 - 145 mmol/L Final  . Potassium 10/12/2014 3.8  3.5 - 5.1 mmol/L Final  . Chloride 10/12/2014 99* 101 - 111 mmol/L Final  . CO2 10/12/2014 27  22 - 32 mmol/L Final  . Glucose, Bld 10/12/2014 93  65 - 99 mg/dL Final  . BUN 10/12/2014 25* 6 - 20 mg/dL Final  . Creatinine, Ser 10/12/2014 1.27* 0.44 - 1.00 mg/dL Final  . Calcium 10/12/2014 9.1  8.9 - 10.3 mg/dL Final  . Total Protein 10/12/2014 7.6  6.5 - 8.1 g/dL Final  . Albumin 10/12/2014 4.0  3.5 - 5.0 g/dL Final  . AST 10/12/2014 31  15 - 41 U/L Final  . ALT 10/12/2014 21  14 - 54 U/L Final  . Alkaline Phosphatase 10/12/2014 89  38 - 126 U/L Final  . Total  Bilirubin 10/12/2014 1.7* 0.3 - 1.2 mg/dL Final  . GFR calc non Af Amer 10/12/2014 45* >60 mL/min Final  . GFR calc Af  Amer 10/12/2014 52* >60 mL/min Final   Comment: (NOTE) The eGFR has been calculated using the CKD EPI equation. This calculation has not been validated in all clinical situations. eGFR's persistently <60 mL/min signify possible Chronic Kidney Disease.   . Anion gap 10/12/2014 8  5 - 15 Final  . 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  CT scan shows increase in size of the retroperitoneal lymph node MRI scan shows soft tissue mass extending into T 11 neurological foramina  MRI scan has been reviewed independently Patient will be referred to radiation oncologist for possible radiation therapy Proceed with XGEVA Hold off any further Avastin therapy Progressing disease Consider chemotherapy versus Megace Pain management Increase fentanyl patch to 75 g Oxycodone 15 mg every 4 hourly when necessary Prednisone 60 mg tapering by 10 mg 20 And evaluate patient after radiation is finished     Patient expressed understanding and was in agreement with this plan. She also understands that She can call clinic at any time with any questions, concerns, or complaints.    Primary cancer of endometrium   Staging form: Corpus Uteri - Carcinoma, AJCC 7th Edition     Clinical: Stage IVB (T1a, N0, M1) - Signed by Forest Gleason, MD on 05/24/2014     Pathologic: No stage assigned - Marni Griffon, MD   10/19/2014 3:41 PM

## 2014-10-20 ENCOUNTER — Encounter: Payer: Self-pay | Admitting: Oncology

## 2014-10-20 ENCOUNTER — Ambulatory Visit: Payer: No Typology Code available for payment source | Admitting: Oncology

## 2014-10-20 ENCOUNTER — Ambulatory Visit: Payer: No Typology Code available for payment source

## 2014-10-20 ENCOUNTER — Inpatient Hospital Stay (HOSPITAL_BASED_OUTPATIENT_CLINIC_OR_DEPARTMENT_OTHER): Payer: No Typology Code available for payment source | Admitting: Oncology

## 2014-10-20 ENCOUNTER — Encounter: Payer: Self-pay | Admitting: Radiation Oncology

## 2014-10-20 ENCOUNTER — Ambulatory Visit
Admission: RE | Admit: 2014-10-20 | Discharge: 2014-10-20 | Disposition: A | Discharge status: Home / Self Care | Payer: No Typology Code available for payment source | Source: Ambulatory Visit | Attending: Radiation Oncology | Admitting: Radiation Oncology

## 2014-10-20 ENCOUNTER — Inpatient Hospital Stay: Payer: No Typology Code available for payment source

## 2014-10-20 ENCOUNTER — Other Ambulatory Visit: Payer: No Typology Code available for payment source

## 2014-10-20 VITALS — BP 131/85 | HR 70 | Temp 96.4°F | Wt 175.9 lb

## 2014-10-20 DIAGNOSIS — M4804 Spinal stenosis, thoracic region: Secondary | ICD-10-CM | POA: Insufficient documentation

## 2014-10-20 DIAGNOSIS — K7460 Unspecified cirrhosis of liver: Secondary | ICD-10-CM

## 2014-10-20 DIAGNOSIS — Z90710 Acquired absence of both cervix and uterus: Secondary | ICD-10-CM | POA: Insufficient documentation

## 2014-10-20 DIAGNOSIS — Z8669 Personal history of other diseases of the nervous system and sense organs: Secondary | ICD-10-CM

## 2014-10-20 DIAGNOSIS — Z510 Encounter for antineoplastic radiation therapy: Secondary | ICD-10-CM | POA: Insufficient documentation

## 2014-10-20 DIAGNOSIS — I10 Essential (primary) hypertension: Secondary | ICD-10-CM

## 2014-10-20 DIAGNOSIS — M25561 Pain in right knee: Secondary | ICD-10-CM

## 2014-10-20 DIAGNOSIS — Z7901 Long term (current) use of anticoagulants: Secondary | ICD-10-CM | POA: Insufficient documentation

## 2014-10-20 DIAGNOSIS — C7951 Secondary malignant neoplasm of bone: Secondary | ICD-10-CM | POA: Insufficient documentation

## 2014-10-20 DIAGNOSIS — Z170 Estrogen receptor positive status [ER+]: Secondary | ICD-10-CM

## 2014-10-20 DIAGNOSIS — M25562 Pain in left knee: Secondary | ICD-10-CM

## 2014-10-20 DIAGNOSIS — C541 Malignant neoplasm of endometrium: Secondary | ICD-10-CM

## 2014-10-20 DIAGNOSIS — K219 Gastro-esophageal reflux disease without esophagitis: Secondary | ICD-10-CM

## 2014-10-20 DIAGNOSIS — R971 Elevated cancer antigen 125 [CA 125]: Secondary | ICD-10-CM

## 2014-10-20 DIAGNOSIS — Z7982 Long term (current) use of aspirin: Secondary | ICD-10-CM

## 2014-10-20 DIAGNOSIS — Z9049 Acquired absence of other specified parts of digestive tract: Secondary | ICD-10-CM

## 2014-10-20 DIAGNOSIS — M545 Low back pain: Secondary | ICD-10-CM

## 2014-10-20 DIAGNOSIS — C779 Secondary and unspecified malignant neoplasm of lymph node, unspecified: Secondary | ICD-10-CM | POA: Diagnosis not present

## 2014-10-20 DIAGNOSIS — Z79899 Other long term (current) drug therapy: Secondary | ICD-10-CM

## 2014-10-20 DIAGNOSIS — R188 Other ascites: Secondary | ICD-10-CM

## 2014-10-20 DIAGNOSIS — M1990 Unspecified osteoarthritis, unspecified site: Secondary | ICD-10-CM | POA: Insufficient documentation

## 2014-10-20 DIAGNOSIS — I2699 Other pulmonary embolism without acute cor pulmonale: Secondary | ICD-10-CM

## 2014-10-20 LAB — CBC WITH DIFFERENTIAL/PLATELET
BASOS ABS: 0.1 10*3/uL (ref 0–0.1)
BASOS PCT: 1 %
EOS ABS: 0.1 10*3/uL (ref 0–0.7)
EOS PCT: 1 %
HEMATOCRIT: 38.8 % (ref 35.0–47.0)
Hemoglobin: 12.7 g/dL (ref 12.0–16.0)
Lymphocytes Relative: 24 %
Lymphs Abs: 2.1 10*3/uL (ref 1.0–3.6)
MCH: 28.8 pg (ref 26.0–34.0)
MCHC: 32.8 g/dL (ref 32.0–36.0)
MCV: 87.6 fL (ref 80.0–100.0)
MONO ABS: 0.7 10*3/uL (ref 0.2–0.9)
MONOS PCT: 8 %
NEUTROS ABS: 5.8 10*3/uL (ref 1.4–6.5)
Neutrophils Relative %: 66 %
PLATELETS: 275 10*3/uL (ref 150–440)
RBC: 4.43 MIL/uL (ref 3.80–5.20)
RDW: 15.9 % — AB (ref 11.5–14.5)
WBC: 8.7 10*3/uL (ref 3.6–11.0)

## 2014-10-20 LAB — COMPREHENSIVE METABOLIC PANEL
ALT: 16 U/L (ref 14–54)
ANION GAP: 9 (ref 5–15)
AST: 21 U/L (ref 15–41)
Albumin: 3.8 g/dL (ref 3.5–5.0)
Alkaline Phosphatase: 94 U/L (ref 38–126)
BILIRUBIN TOTAL: 0.7 mg/dL (ref 0.3–1.2)
BUN: 33 mg/dL — ABNORMAL HIGH (ref 6–20)
CHLORIDE: 100 mmol/L — AB (ref 101–111)
CO2: 27 mmol/L (ref 22–32)
Calcium: 8.9 mg/dL (ref 8.9–10.3)
Creatinine, Ser: 1.37 mg/dL — ABNORMAL HIGH (ref 0.44–1.00)
GFR calc Af Amer: 47 mL/min — ABNORMAL LOW (ref >60)
GFR calc non Af Amer: 41 mL/min — ABNORMAL LOW (ref >60)
GLUCOSE: 102 mg/dL — AB (ref 65–99)
POTASSIUM: 4.1 mmol/L (ref 3.5–5.1)
Sodium: 136 mmol/L (ref 135–145)
TOTAL PROTEIN: 7 g/dL (ref 6.5–8.1)

## 2014-10-20 LAB — MAGNESIUM: MAGNESIUM: 2.1 mg/dL (ref 1.7–2.4)

## 2014-10-20 LAB — PROTIME-INR
INR: 2.62
Prothrombin Time: 28.1 seconds — ABNORMAL HIGH (ref 11.4–15.0)

## 2014-10-20 MED ORDER — DEXAMETHASONE 4 MG PO TABS: ORAL_TABLET | ORAL | Status: DC

## 2014-10-20 MED ORDER — OXYCODONE HCL 15 MG PO TABS: 30.0000 mg | ORAL_TABLET | ORAL | Status: DC | PRN

## 2014-10-20 MED ORDER — HEPARIN SOD (PORK) LOCK FLUSH 100 UNIT/ML IV SOLN
INTRAVENOUS | Status: AC
Start: 2014-10-20 — End: 2014-10-20
  Filled 2014-10-20: qty 5

## 2014-10-20 MED ORDER — HEPARIN SOD (PORK) LOCK FLUSH 100 UNIT/ML IV SOLN
500.0000 [IU] | Freq: Once | INTRAVENOUS | Status: AC
  Administered 2014-10-20: 500 [IU] via INTRAVENOUS

## 2014-10-20 MED ORDER — DENOSUMAB 120 MG/1.7ML ~~LOC~~ SOLN
120.0000 mg | Freq: Once | SUBCUTANEOUS | Status: AC
  Administered 2014-10-20: 120 mg via SUBCUTANEOUS
  Filled 2014-10-20: qty 1.7

## 2014-10-20 NOTE — Progress Notes (Signed)
[place] @ [facility] Telephone:(336) [phone]  Fax:(336) [place]: 10/21/1955  MR#: [id number]  RSW#:[id number]  Patient Care Team: Ashok Norris, MD as PCP - General (Family Medicine)  CHIEF COMPLAINT:  Chief Complaint  Patient presents with  . 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 abdominal paracentesis x1 negative for malignant cells(22nd September)  2.PET scan shows extensive disease with lymph node metastases and bone metastases T1 N0 M1 disease biopsy is consistent with adenocarcinoma GYN or lesion.  Most likely from the endometrial cancer.  Estrogen receptor positive.  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 abdominal paracentesis x1 negative for malignant cells(22nd September)  2.PET scan shows extensive disease with lymph node metastases and bone metastases T1 N0 M1 disease biopsy is consistent with adenocarcinoma GYN or lesion.  Most likely from the endometrial cancer.  Estrogen receptor positive.  HER-2/neu receptor pending(October, 2015)  3.patient started on chemotherapy with carboplatinum and Taxolfrom November 06, 2013 4.acute pulmonary embolism February of 2016 on xeralto. 5.  Because of insurance not paying for [place] patient has been switched over to Coumadin (May, 2016) 6.  Now on maintenance a  Avastin (April, 2016)   6.  Progressing disease by CT scan and MRI scan (October, 2016)    Oncology Flowsheet 07/15/2014 08/05/2014 08/27/2014 09/17/2014 10/08/2014 10/12/2014 10/15/2014  bevacizumab (AVASTIN) IV 10 mg/kg 10 mg/kg 10 mg/kg 10 mg/kg 10 mg/kg - -  dexamethasone (DECADRON) IV - - - - - - 8 mg  ondansetron (ZOFRAN) IV - - - - - 4 mg -    INTERVAL HISTORY:  59 year old lady with stage IV recurrent carcinoma of endometrium having increasing pain in low back area pain radiating down to left lower extremity.  Since last evaluation patient had been in the emergency room twice.  All the records from the emergency room and been reviewed. Patient had MRI scan which is revealed the soft tissue mass invading into thoracic spine. Patient continues to have pain.  Here for further follow-up with the family member to discuss the results and further planning of treatment  REVIEW OF SYSTEMS:    general status: Patient is feeling weak and tired.  No change in a performance status.  No chills.  No fever. HEENT:   No evidence of stomatitis Lungs: No cough or shortness of breath Cardiac: No chest pain or paroxysmal nocturnal dyspnea GI: No nausea no vomiting no diarrhea no abdominal pain Skin: No rash Lower extremity no swelling Neurological system: No tingling.  No numbness.  No other focal signs Low back pain and pain radiating down to lower extremity.  Constant dull aching throbbing pain All other systems reviewed as per history of present illness  PAST MEDICAL HISTORY: Past Medical History  Diagnosis Date  . Cancer ([place])   . Primary cancer of endometrium ([place]) 05/24/2014  . Hypertension   . Knee pain, bilateral   . Arthritis   . Renal insufficiency     Significant History/PMH:   Stage 4 peritoneal Cancer:    Cirrhosis:    GERD:    endometerial cancer:    Arthritis:    Hypertension:    Migraines:    Hysterectomy:    Cholecystectomy:    Dilation and Curretage: with  hysteroscopy, Aug 2012 FAMILY HISTORY Family History  Problem Relation Age of Onset  . Breast cancer Sister 21  . Hypertension Sister   . Diabetes Mother   . Hypertension Mother       ADVANCED DIRECTIVES:does have advanced healthcare directive   HEALTH MAINTENANCE: Social History  Substance Use Topics  . Smoking status: Never Smoker   . Smokeless tobacco: None  . Alcohol Use: No    :  Allergies  Allergen Reactions  . Sulfa Antibiotics Anaphylaxis    Current Outpatient Prescriptions  Medication Sig Dispense Refill  . aspirin 325 MG tablet Take 325 mg by mouth daily.    Marland Kitchen atenolol (TENORMIN) 100 MG tablet Take 1 tablet (100 mg total) by mouth daily. 30 tablet 5  . citalopram (CELEXA) 40 MG tablet Take 1 tablet (40 mg total) by mouth daily. 30 tablet 2  . [facility] SENNA PLUS 8.6-50 MG per tablet TAKE 1 TABLET BY MOUTH TWICE A DAY 60 tablet 3  . cyclobenzaprine (FLEXERIL) 5 MG tablet Take 1 tablet (5 mg total) by mouth 3 (three) times daily as needed for muscle spasms. 30 tablet 3  . famotidine (PEPCID) 20 MG tablet Take 1 tablet (20 mg total) by mouth 2 (two) times daily. 60 tablet 5  . fentaNYL (DURAGESIC - DOSED MCG/HR) 75 MCG/HR Place 1 patch (75 mcg total) onto the skin every 3 (three) days. 10 patch 0  . lidocaine-prilocaine (EMLA) cream Apply 1 application topically as needed. 30 g 3  . loratadine (CLARITIN) 10 MG tablet Take 1 tablet (10 mg total) by mouth daily. 30 tablet 5  . mirtazapine (REMERON) 15 MG tablet Take 1 tablet (15 mg total) by mouth at bedtime. 30 tablet 3  . Multiple Vitamin (MULTIVITAMIN) capsule Take by mouth.    . ondansetron (ZOFRAN) 4 MG tablet Take 4 mg by mouth every 6 (six) hours as needed.  3  . oxyCODONE (ROXICODONE) 15 MG immediate release tablet Take 2 tablets (30 mg total) by mouth every 2 (two) hours as needed for pain. 60 tablet 0  . psyllium (METAMUCIL) 58.6 % powder Take 1 packet by mouth 3 (three) times daily.    Marland Kitchen warfarin (COUMADIN) 5  MG tablet Take 1 tablet (5 mg total) by mouth daily at 6 PM. 30 tablet 1  . dexamethasone (DECADRON) 4 MG tablet Take 2 tabs po x 2 weeks, then take 1 tab po x 1 week, then stop 35 tablet 0   No current facility-administered medications for this visit.    OBJECTIVE:  Filed Vitals:   10/20/14 0848  BP: 131/85  Pulse: 70  Temp: 96.4 F (35.8 C)     Body mass index is 30.18 kg/(m^2).    ECOG FS:1 - Symptomatic but completely ambulatory  PHYSICAL EXAM: GENERAL:  Well developed, well nourished, sitting comfortably in the exam room in no acute distress. MENTAL STATUS:  Alert and oriented to person, place and time. HEAD:  Alopecia.  Normocephalic, atraumatic, face symmetric, no Cushingoid features. EYES: pale  Pupils equal round and reactive to light and accomodation.  No conjunctivitis or scleral icterus. ENT:  Oropharynx clear without lesion.  Tongue normal. Mucous membranes moist.  RESPIRATORY:  Clear to auscultation without rales, wheezes or rhonchi. CARDIOVASCULAR:  Regular rate and rhythm without murmur, rub or gallop. . ABDOMEN:  Soft, non-tender, with active bowel sounds, and no hepatosplenomegaly.  No masses. Tenderness in lower abdominal area.  No palpable masses. BACK: Tenderness in the low back area.  No swelling.  No deformity. SKIN:  No rashes, ulcers or lesions. EXTREMITIES: No edema, no skin discoloration or tenderness.  No palpable cords. LYMPH NODES: No palpable cervical, supraclavicular, axillary or inguinal adenopathy  NEUROLOGICAL: Unremarkable. PSYCH:  Less depressed LAB RESULTS:  Infusion on 10/20/2014  Component Date Value Ref Range Status  . WBC 10/20/2014 8.7  3.6 - 11.0 K/uL Final   A-LINE DRAW  . RBC 10/20/2014 4.43  3.80 - 5.20 MIL/uL Final  . Hemoglobin 10/20/2014 12.7  12.0 - 16.0 g/dL Final  . HCT 10/20/2014 38.8  35.0 - 47.0 % Final  . MCV 10/20/2014 87.6  80.0 - 100.0 fL Final  . MCH 10/20/2014 28.8  26.0 - 34.0 pg Final  . MCHC 10/20/2014 32.8   32.0 - 36.0 g/dL Final  . RDW 10/20/2014 15.9* 11.5 - 14.5 % Final  . Platelets 10/20/2014 275  150 - 440 K/uL Final  . Neutrophils Relative % 10/20/2014 66   Final  . Neutro Abs 10/20/2014 5.8  1.4 - 6.5 K/uL Final  . Lymphocytes Relative 10/20/2014 24   Final  . Lymphs Abs 10/20/2014 2.1  1.0 - 3.6 K/uL Final  . Monocytes Relative 10/20/2014 8   Final  . Monocytes Absolute 10/20/2014 0.7  0.2 - 0.9 K/uL Final  . Eosinophils Relative 10/20/2014 1   Final  . Eosinophils Absolute 10/20/2014 0.1  0 - 0.7 K/uL Final  . Basophils Relative 10/20/2014 1   Final  . Basophils Absolute 10/20/2014 0.1  0 - 0.1 K/uL Final  . Sodium 10/20/2014 136  135 - 145 mmol/L Final  . Potassium 10/20/2014 4.1  3.5 - 5.1 mmol/L Final  . Chloride 10/20/2014 100* 101 - 111 mmol/L Final  . CO2 10/20/2014 27  22 - 32 mmol/L Final  . Glucose, Bld 10/20/2014 102* 65 - 99 mg/dL Final  . BUN 10/20/2014 33* 6 - 20 mg/dL Final  . Creatinine, Ser 10/20/2014 1.37* 0.44 - 1.00 mg/dL Final  . Calcium 10/20/2014 8.9  8.9 - 10.3 mg/dL Final  . Total Protein 10/20/2014 7.0  6.5 - 8.1 g/dL Final  . Albumin 10/20/2014 3.8  3.5 - 5.0 g/dL Final  . AST 10/20/2014 21  15 - 41 U/L Final  . ALT 10/20/2014 16  14 - 54 U/L Final  . Alkaline Phosphatase 10/20/2014 94  38 - 126 U/L Final  . Total Bilirubin 10/20/2014 0.7  0.3 - 1.2 mg/dL Final  . GFR calc non Af Amer 10/20/2014 41* >60 mL/min Final  . GFR calc Af Amer 10/20/2014 47* >60 mL/min Final   Comment: (NOTE) The eGFR has been calculated using the CKD EPI equation. This calculation has not been validated in all clinical situations. eGFR's persistently <60 mL/min signify possible Chronic Kidney Disease.   . Anion gap 10/20/2014 9  5 - 15 Final  . Prothrombin Time 10/20/2014 28.1* 11.4 - 15.0 seconds Final  . INR 10/20/2014 2.62   Final  .  Magnesium 10/20/2014 2.1  1.7 - 2.4 mg/dL Final     Lab Results  Component Value Date   CA125 23.9 09/17/2014   Lab Results    Component Value Date   CEA 3.5 07/15/2014    Lab Results  Component Value Date   CA125 23.9 09/17/2014    ASSESSMENT: Carcinoma of endometrium recurrent disease with pericorneal implants and ascites Constant dull aching pain in low back area CT scan done in the emergency room as well as emergency room records have been reviewed independently.  CT scan shows increase in size of the retroperitoneal lymph node MRI scan shows soft tissue mass extending into T 11 neurological foramina  MRI scan has been reviewed independently Patient will be referred to radiation oncologist for possible radiation therapy Proceed with XGEVA Hold off any further Avastin therapy Progressing disease Consider chemotherapy versus Megace Pain management Increase fentanyl patch to 75 g Oxycodone 15 mg every 4 hourly when necessary  And evaluate patient after radiation is finished I had prolonged discussion with patient and family MRI scan has been reviewed independently and also reviewed with the patient Discussed situation with Dr. Baruch Gouty for possibility of palliative radiation therapy Hold further chemotherapy Replace prednisone by Decadron 8 mg for 2 weeks followed by 4 mg daily for 1 week For  Xgeva today After radiation therapy is finished patient may be considered for cis-platinum and Taxol chemotherapy.  Taxol given in a dose dense fashion. Cis-platinum 50 mg/m with Taxol 80 mg once a week for 3 weeks cycle repeated every 4 weeks Total duration of visit was 25 minutes.  50% or more time was spent in counseling patient and family regarding prognosis and options of treatment and available resources Patient expressed understanding and was in agreement with this plan. She also understands that She can call clinic at any time with any questions, concerns, or complaints.    Primary cancer of endometrium   Staging form: Corpus Uteri - Carcinoma, AJCC 7th Edition     Clinical: Stage IVB (T1a, N0, M1) -  Signed by Forest Gleason, MD on 05/24/2014     Pathologic: No stage assigned - Marni Griffon, MD   10/20/2014 9:17 AM

## 2014-10-20 NOTE — Consult Note (Signed)
Except an outstanding is perfect of Radiation Oncology NEW PATIENT EVALUATION  Name: Emily Zamora  MRN: [id number]  Date:   10/20/2014     DOB: 1955/05/26   This 59 y.o. female patient presents to the clinic for initial evaluation of spinal metastasis from probable endometrial carcinoma.  REFERRING PHYSICIAN: Ashok Norris, MD  CHIEF COMPLAINT: No chief complaint on file.   DIAGNOSIS: The encounter diagnosis was Bone metastases ([place]).   PREVIOUS INVESTIGATIONS:  MRI scans reviewed Pathology report reviewed Clinical notes reviewed  HPI: Patient is a 59 year old female status post TAH/BSO back in August 2012 for stage IA endometrioid adenocarcinoma. She now has extensive stage metastatic disease with lymph node and bone metastasis. Biopsies consistent with adenocarcinoma of GYN origin most likely from endometrial cancer. Tumor is ER positive. She's been started on carboplatinum and Taxol. She is currently on maintenance Avastin. She is had some low back pain radicular to the left lower extremity. She eventually had an MRI scan showing metastatic disease in the T10 vertebral body through the lower sacrum. At T11 there is epidural extension of tumor causing mild left-sided spinal stenosis. Sacral MRI shows metastatic disease in the left ilium acetabulum and L5 vertebral body. There is also significant pelvic lymphadenopathy noted. Patient is ambulating fairly well although does have some decreased strength in the left lower extremity as compared to the right. I've asked to evaluate the patient for possible palliative radiation therapy.  PLANNED TREATMENT REGIMEN: Palliative radiation therapy to thoracic spine  PAST MEDICAL HISTORY:  has a past medical history of Cancer ([place]); Primary cancer of endometrium ([place]) (05/24/2014); Hypertension; Knee pain, bilateral; Arthritis; and Renal insufficiency.    PAST SURGICAL HISTORY:  Past Surgical History  Procedure Laterality Date  . Gallbladder  surgery  2014  . Abdominal hysterectomy      FAMILY HISTORY: family history includes Breast cancer (age of onset: 36) in her sister; Diabetes in her mother; Hypertension in her mother and sister.  SOCIAL HISTORY:  reports that she has never smoked. She does not have any smokeless tobacco history on file. She reports that she does not drink alcohol or use illicit drugs.  ALLERGIES: Sulfa antibiotics  MEDICATIONS:  Current Outpatient Prescriptions  Medication Sig Dispense Refill  . aspirin 325 MG tablet Take 325 mg by mouth daily.    Marland Kitchen atenolol (TENORMIN) 100 MG tablet Take 1 tablet (100 mg total) by mouth daily. 30 tablet 5  . citalopram (CELEXA) 40 MG tablet Take 1 tablet (40 mg total) by mouth daily. 30 tablet 2  . [facility] SENNA PLUS 8.6-50 MG per tablet TAKE 1 TABLET BY MOUTH TWICE A DAY 60 tablet 3  . cyclobenzaprine (FLEXERIL) 5 MG tablet Take 1 tablet (5 mg total) by mouth 3 (three) times daily as needed for muscle spasms. 30 tablet 3  . dexamethasone (DECADRON) 4 MG tablet Take 2 tabs po x 2 weeks, then take 1 tab po x 1 week, then stop 35 tablet 0  . famotidine (PEPCID) 20 MG tablet Take 1 tablet (20 mg total) by mouth 2 (two) times daily. 60 tablet 5  . fentaNYL (DURAGESIC - DOSED MCG/HR) 75 MCG/HR Place 1 patch (75 mcg total) onto the skin every 3 (three) days. 10 patch 0  . lidocaine-prilocaine (EMLA) cream Apply 1 application topically as needed. 30 g 3  . loratadine (CLARITIN) 10 MG tablet Take 1 tablet (10 mg total) by mouth daily. 30 tablet 5  . mirtazapine (REMERON) 15 MG tablet Take 1  tablet (15 mg total) by mouth at bedtime. 30 tablet 3  . Multiple Vitamin (MULTIVITAMIN) capsule Take by mouth.    . ondansetron (ZOFRAN) 4 MG tablet Take 4 mg by mouth every 6 (six) hours as needed.  3  . oxyCODONE (ROXICODONE) 15 MG immediate release tablet Take 2 tablets (30 mg total) by mouth every 2 (two) hours as needed for pain. 60 tablet 0  . psyllium (METAMUCIL) 58.6 % powder Take 1  packet by mouth 3 (three) times daily.    Marland Kitchen warfarin (COUMADIN) 5 MG tablet Take 1 tablet (5 mg total) by mouth daily at 6 PM. 30 tablet 1   No current facility-administered medications for this encounter.    ECOG PERFORMANCE STATUS:  1 - Symptomatic but completely ambulatory  REVIEW OF SYSTEMS:  Patient denies any weight loss, fatigue, weakness, fever, chills or night sweats. Patient denies any loss of vision, blurred vision. Patient denies any ringing  of the ears or hearing loss. No irregular heartbeat. Patient denies heart murmur or history of fainting. Patient denies any chest pain or pain radiating to her upper extremities. Patient denies any shortness of breath, difficulty breathing at night, cough or hemoptysis. Patient denies any swelling in the lower legs. Patient denies any nausea vomiting, vomiting of blood, or coffee ground material in the vomitus. Patient denies any stomach pain. Patient states has had normal bowel movements no significant constipation or diarrhea. Patient denies any dysuria, hematuria or significant nocturia. Patient denies any problems walking, swelling in the joints or loss of balance. Patient denies any skin changes, loss of hair or loss of weight. Patient denies any excessive worrying or anxiety or significant depression. Patient denies any problems with insomnia. Patient denies excessive thirst, polyuria, polydipsia. Patient denies any swollen glands, patient denies easy bruising or easy bleeding. Patient denies any recent infections, allergies or URI. Patient "s visual fields have not changed significantly in recent time.    PHYSICAL EXAM: There were no vitals taken for this visit. Well-developed female in NAD. There is decreased strength in the left lower extremity as compared to the right. Sensory levels are equal and symmetric bilaterally in the lower extremities. Proprioception is intact. Well-developed well-nourished patient in NAD. HEENT reveals PERLA, EOMI,  discs not visualized.  Oral cavity is clear. No oral mucosal lesions are identified. Neck is clear without evidence of cervical or supraclavicular adenopathy. Lungs are clear to A&P. Cardiac examination is essentially unremarkable with regular rate and rhythm without murmur rub or thrill. Abdomen is benign with no organomegaly or masses noted. Motor sensory and DTR levels are equal and symmetric in the upper and lower extremities. Cranial nerves II through XII are grossly intact. Proprioception is intact. No peripheral adenopathy or edema is identified. No motor or sensory levels are noted. Crude visual fields are within normal range.  LABORATORY DATA: Pathology report reviewed    RADIOLOGY RESULTS: MRI scans of thoracic lumbar spine and sacrum reviewed   IMPRESSION: Stage IV endometrial cancer with bone metastasis and epidural compression around T61 in 59 year old female.  PLAN: At this time like to go after the thoracic lesions since I believe her symptoms are most likely related to the epidural compression at the T10 vertebral body. Will treat approximate T9-L3 and Compazine much of her spinal involvement as possible. I would leave the door open for possible treatment to her lower lumbar spine and SI joints and sacrum at a later date depending on her overall treatment response and tolerance. Risks and  benefits of treatment including possible diarrhea, increasing lower urinary tract symptoms, fatigue, alteration of blood counts, all were discussed in detail with the patient. I have set up and ordered CT simulation for tomorrow. Will try to get into treatments this week.  I would like to take this opportunity for allowing me to participate in the care of your patient.Armstead Peaks., MD

## 2014-10-21 ENCOUNTER — Ambulatory Visit
Admission: RE | Admit: 2014-10-21 | Discharge: 2014-10-21 | Disposition: A | Discharge status: Home / Self Care | Payer: No Typology Code available for payment source | Source: Ambulatory Visit | Attending: Radiation Oncology | Admitting: Radiation Oncology

## 2014-10-21 DIAGNOSIS — M1990 Unspecified osteoarthritis, unspecified site: Secondary | ICD-10-CM | POA: Diagnosis not present

## 2014-10-21 DIAGNOSIS — Z510 Encounter for antineoplastic radiation therapy: Secondary | ICD-10-CM | POA: Diagnosis not present

## 2014-10-21 DIAGNOSIS — C541 Malignant neoplasm of endometrium: Secondary | ICD-10-CM | POA: Diagnosis not present

## 2014-10-21 DIAGNOSIS — Z90710 Acquired absence of both cervix and uterus: Secondary | ICD-10-CM | POA: Diagnosis not present

## 2014-10-21 DIAGNOSIS — Z170 Estrogen receptor positive status [ER+]: Secondary | ICD-10-CM | POA: Diagnosis not present

## 2014-10-21 DIAGNOSIS — Z7982 Long term (current) use of aspirin: Secondary | ICD-10-CM | POA: Diagnosis not present

## 2014-10-21 DIAGNOSIS — C7951 Secondary malignant neoplasm of bone: Secondary | ICD-10-CM | POA: Diagnosis not present

## 2014-10-21 DIAGNOSIS — M4804 Spinal stenosis, thoracic region: Secondary | ICD-10-CM | POA: Diagnosis not present

## 2014-10-21 DIAGNOSIS — Z7901 Long term (current) use of anticoagulants: Secondary | ICD-10-CM | POA: Diagnosis not present

## 2014-10-21 DIAGNOSIS — I10 Essential (primary) hypertension: Secondary | ICD-10-CM | POA: Diagnosis not present

## 2014-10-22 DIAGNOSIS — Z510 Encounter for antineoplastic radiation therapy: Secondary | ICD-10-CM | POA: Diagnosis not present

## 2014-10-23 DIAGNOSIS — Z510 Encounter for antineoplastic radiation therapy: Secondary | ICD-10-CM | POA: Diagnosis not present

## 2014-10-24 ENCOUNTER — Ambulatory Visit
Admission: RE | Admit: 2014-10-24 | Discharge: 2014-10-24 | Disposition: A | Discharge status: Home / Self Care | Payer: No Typology Code available for payment source | Source: Ambulatory Visit | Attending: Radiation Oncology | Admitting: Radiation Oncology

## 2014-10-24 DIAGNOSIS — Z510 Encounter for antineoplastic radiation therapy: Secondary | ICD-10-CM | POA: Diagnosis not present

## 2014-10-27 ENCOUNTER — Other Ambulatory Visit: Payer: Self-pay | Admitting: *Deleted

## 2014-10-27 ENCOUNTER — Ambulatory Visit
Admission: RE | Admit: 2014-10-27 | Discharge: 2014-10-27 | Disposition: A | Discharge status: Home / Self Care | Payer: No Typology Code available for payment source | Source: Ambulatory Visit | Attending: Radiation Oncology | Admitting: Radiation Oncology

## 2014-10-27 DIAGNOSIS — Z510 Encounter for antineoplastic radiation therapy: Secondary | ICD-10-CM | POA: Diagnosis not present

## 2014-10-27 DIAGNOSIS — C541 Malignant neoplasm of endometrium: Secondary | ICD-10-CM

## 2014-10-27 DIAGNOSIS — M545 Low back pain: Secondary | ICD-10-CM

## 2014-10-27 MED ORDER — OXYCODONE HCL 15 MG PO TABS: 30.0000 mg | ORAL_TABLET | ORAL | Status: DC | PRN

## 2014-10-27 NOTE — Telephone Encounter (Signed)
Asking for refill on Oxycodone 15 mg, she just got #60 tabs on 10/20/14 She is also on fentanyl 75 mcg

## 2014-10-27 NOTE — Telephone Encounter (Signed)
Informed that prescription is ready to pick up  

## 2014-10-28 ENCOUNTER — Other Ambulatory Visit: Payer: No Typology Code available for payment source

## 2014-10-28 ENCOUNTER — Ambulatory Visit: Payer: No Typology Code available for payment source

## 2014-10-28 ENCOUNTER — Ambulatory Visit: Payer: No Typology Code available for payment source | Admitting: Oncology

## 2014-10-28 ENCOUNTER — Ambulatory Visit
Admission: RE | Admit: 2014-10-28 | Discharge: 2014-10-28 | Disposition: A | Discharge status: Home / Self Care | Payer: No Typology Code available for payment source | Source: Ambulatory Visit | Attending: Radiation Oncology | Admitting: Radiation Oncology

## 2014-10-28 DIAGNOSIS — Z510 Encounter for antineoplastic radiation therapy: Secondary | ICD-10-CM | POA: Diagnosis not present

## 2014-10-29 ENCOUNTER — Ambulatory Visit
Admission: RE | Admit: 2014-10-29 | Discharge: 2014-10-29 | Disposition: A | Discharge status: Home / Self Care | Payer: No Typology Code available for payment source | Source: Ambulatory Visit | Attending: Radiation Oncology | Admitting: Radiation Oncology

## 2014-10-29 ENCOUNTER — Ambulatory Visit: Payer: No Typology Code available for payment source | Admitting: Oncology

## 2014-10-29 ENCOUNTER — Ambulatory Visit: Payer: No Typology Code available for payment source

## 2014-10-29 ENCOUNTER — Other Ambulatory Visit: Payer: No Typology Code available for payment source

## 2014-10-29 DIAGNOSIS — Z510 Encounter for antineoplastic radiation therapy: Secondary | ICD-10-CM | POA: Diagnosis not present

## 2014-10-30 ENCOUNTER — Ambulatory Visit
Admission: RE | Admit: 2014-10-30 | Discharge: 2014-10-30 | Disposition: A | Discharge status: Home / Self Care | Payer: No Typology Code available for payment source | Source: Ambulatory Visit | Attending: Radiation Oncology | Admitting: Radiation Oncology

## 2014-10-30 DIAGNOSIS — Z510 Encounter for antineoplastic radiation therapy: Secondary | ICD-10-CM | POA: Diagnosis not present

## 2014-10-31 ENCOUNTER — Ambulatory Visit
Admission: RE | Admit: 2014-10-31 | Discharge: 2014-10-31 | Disposition: A | Discharge status: Home / Self Care | Payer: No Typology Code available for payment source | Source: Ambulatory Visit | Attending: Radiation Oncology | Admitting: Radiation Oncology

## 2014-10-31 DIAGNOSIS — Z510 Encounter for antineoplastic radiation therapy: Secondary | ICD-10-CM | POA: Diagnosis not present

## 2014-11-01 ENCOUNTER — Other Ambulatory Visit: Payer: Self-pay | Admitting: Oncology

## 2014-11-02 ENCOUNTER — Ambulatory Visit
Admission: RE | Admit: 2014-11-02 | Discharge: 2014-11-02 | Disposition: A | Discharge status: Home / Self Care | Payer: No Typology Code available for payment source | Source: Ambulatory Visit | Attending: Radiation Oncology | Admitting: Radiation Oncology

## 2014-11-03 ENCOUNTER — Ambulatory Visit
Admission: RE | Admit: 2014-11-03 | Discharge: 2014-11-03 | Disposition: A | Discharge status: Home / Self Care | Payer: No Typology Code available for payment source | Source: Ambulatory Visit | Attending: Radiation Oncology | Admitting: Radiation Oncology

## 2014-11-03 DIAGNOSIS — Z510 Encounter for antineoplastic radiation therapy: Secondary | ICD-10-CM | POA: Diagnosis not present

## 2014-11-04 ENCOUNTER — Ambulatory Visit
Admission: RE | Admit: 2014-11-04 | Discharge: 2014-11-04 | Disposition: A | Discharge status: Home / Self Care | Payer: No Typology Code available for payment source | Source: Ambulatory Visit | Attending: Radiation Oncology | Admitting: Radiation Oncology

## 2014-11-04 DIAGNOSIS — Z510 Encounter for antineoplastic radiation therapy: Secondary | ICD-10-CM | POA: Diagnosis not present

## 2014-11-05 ENCOUNTER — Ambulatory Visit
Admission: RE | Admit: 2014-11-05 | Discharge: 2014-11-05 | Disposition: A | Discharge status: Home / Self Care | Payer: No Typology Code available for payment source | Source: Ambulatory Visit | Attending: Radiation Oncology | Admitting: Radiation Oncology

## 2014-11-05 DIAGNOSIS — Z510 Encounter for antineoplastic radiation therapy: Secondary | ICD-10-CM | POA: Diagnosis not present

## 2014-11-06 ENCOUNTER — Other Ambulatory Visit: Payer: Self-pay | Admitting: Oncology

## 2014-11-06 NOTE — Progress Notes (Unsigned)
PSN met with patient and son today.  PSN gave patient the contact information to a patient financial advocate with [employer] to discuss the financial hardship process and to make payment arrangements.  PSN will assist patient with some monthly expenses through the [facility].  Additionally, PSN advised patient to apply for Medicaid.

## 2014-11-07 ENCOUNTER — Telehealth: Payer: Self-pay | Admitting: *Deleted

## 2014-11-07 DIAGNOSIS — C541 Malignant neoplasm of endometrium: Secondary | ICD-10-CM

## 2014-11-07 MED ORDER — ONDANSETRON HCL 4 MG PO TABS: 4.0000 mg | ORAL_TABLET | Freq: Four times a day (QID) | ORAL | Status: DC | PRN

## 2014-11-07 MED ORDER — DEXAMETHASONE 4 MG PO TABS: 8.0000 mg | ORAL_TABLET | Freq: Two times a day (BID) | ORAL | Status: DC

## 2014-11-07 NOTE — Telephone Encounter (Signed)
Called pt to inform her of 2 prescriptions that have been called into pharmacy. Pt verbalized understanding.

## 2014-11-10 ENCOUNTER — Encounter: Payer: Self-pay | Admitting: Oncology

## 2014-11-10 ENCOUNTER — Inpatient Hospital Stay: Payer: No Typology Code available for payment source

## 2014-11-10 ENCOUNTER — Inpatient Hospital Stay: Payer: No Typology Code available for payment source | Attending: Oncology | Admitting: Oncology

## 2014-11-10 VITALS — BP 118/86 | HR 82 | Temp 96.4°F | Wt 165.0 lb

## 2014-11-10 DIAGNOSIS — Z923 Personal history of irradiation: Secondary | ICD-10-CM | POA: Insufficient documentation

## 2014-11-10 DIAGNOSIS — K7460 Unspecified cirrhosis of liver: Secondary | ICD-10-CM | POA: Insufficient documentation

## 2014-11-10 DIAGNOSIS — Z7901 Long term (current) use of anticoagulants: Secondary | ICD-10-CM | POA: Insufficient documentation

## 2014-11-10 DIAGNOSIS — M545 Low back pain: Secondary | ICD-10-CM | POA: Diagnosis not present

## 2014-11-10 DIAGNOSIS — K219 Gastro-esophageal reflux disease without esophagitis: Secondary | ICD-10-CM | POA: Insufficient documentation

## 2014-11-10 DIAGNOSIS — R5383 Other fatigue: Secondary | ICD-10-CM | POA: Insufficient documentation

## 2014-11-10 DIAGNOSIS — Z8669 Personal history of other diseases of the nervous system and sense organs: Secondary | ICD-10-CM | POA: Diagnosis not present

## 2014-11-10 DIAGNOSIS — Z79899 Other long term (current) drug therapy: Secondary | ICD-10-CM | POA: Insufficient documentation

## 2014-11-10 DIAGNOSIS — C779 Secondary and unspecified malignant neoplasm of lymph node, unspecified: Secondary | ICD-10-CM | POA: Diagnosis not present

## 2014-11-10 DIAGNOSIS — Z5111 Encounter for antineoplastic chemotherapy: Secondary | ICD-10-CM | POA: Diagnosis present

## 2014-11-10 DIAGNOSIS — R188 Other ascites: Secondary | ICD-10-CM | POA: Diagnosis not present

## 2014-11-10 DIAGNOSIS — Z9221 Personal history of antineoplastic chemotherapy: Secondary | ICD-10-CM | POA: Diagnosis not present

## 2014-11-10 DIAGNOSIS — M79652 Pain in left thigh: Secondary | ICD-10-CM | POA: Insufficient documentation

## 2014-11-10 DIAGNOSIS — Z86711 Personal history of pulmonary embolism: Secondary | ICD-10-CM | POA: Insufficient documentation

## 2014-11-10 DIAGNOSIS — I10 Essential (primary) hypertension: Secondary | ICD-10-CM | POA: Insufficient documentation

## 2014-11-10 DIAGNOSIS — C7951 Secondary malignant neoplasm of bone: Secondary | ICD-10-CM | POA: Diagnosis not present

## 2014-11-10 DIAGNOSIS — Z7982 Long term (current) use of aspirin: Secondary | ICD-10-CM | POA: Diagnosis not present

## 2014-11-10 DIAGNOSIS — R531 Weakness: Secondary | ICD-10-CM | POA: Diagnosis not present

## 2014-11-10 DIAGNOSIS — C541 Malignant neoplasm of endometrium: Secondary | ICD-10-CM | POA: Insufficient documentation

## 2014-11-10 LAB — COMPREHENSIVE METABOLIC PANEL
ALBUMIN: 3.6 g/dL (ref 3.5–5.0)
ALT: 24 U/L (ref 14–54)
AST: 37 U/L (ref 15–41)
Alkaline Phosphatase: 162 U/L — ABNORMAL HIGH (ref 38–126)
Anion gap: 9 (ref 5–15)
BUN: 33 mg/dL — ABNORMAL HIGH (ref 6–20)
CHLORIDE: 100 mmol/L — AB (ref 101–111)
CO2: 24 mmol/L (ref 22–32)
CREATININE: 1.28 mg/dL — AB (ref 0.44–1.00)
Calcium: 9.2 mg/dL (ref 8.9–10.3)
GFR calc Af Amer: 51 mL/min — ABNORMAL LOW (ref >60)
GFR, EST NON AFRICAN AMERICAN: 44 mL/min — AB (ref >60)
Glucose, Bld: 97 mg/dL (ref 65–99)
POTASSIUM: 4.2 mmol/L (ref 3.5–5.1)
Sodium: 133 mmol/L — ABNORMAL LOW (ref 135–145)
Total Bilirubin: 0.7 mg/dL (ref 0.3–1.2)
Total Protein: 6.5 g/dL (ref 6.5–8.1)

## 2014-11-10 LAB — CBC WITH DIFFERENTIAL/PLATELET
BASOS ABS: 0 10*3/uL (ref 0–0.1)
Basophils Relative: 0 %
Eosinophils Absolute: 0.1 10*3/uL (ref 0–0.7)
Eosinophils Relative: 1 %
HEMATOCRIT: 43.8 % (ref 35.0–47.0)
Hemoglobin: 14.6 g/dL (ref 12.0–16.0)
LYMPHS PCT: 11 %
Lymphs Abs: 0.6 10*3/uL — ABNORMAL LOW (ref 1.0–3.6)
MCH: 29.2 pg (ref 26.0–34.0)
MCHC: 33.3 g/dL (ref 32.0–36.0)
MCV: 87.7 fL (ref 80.0–100.0)
Monocytes Absolute: 0.5 10*3/uL (ref 0.2–0.9)
Monocytes Relative: 8 %
NEUTROS ABS: 4.5 10*3/uL (ref 1.4–6.5)
Neutrophils Relative %: 80 %
PLATELETS: 125 10*3/uL — AB (ref 150–440)
RBC: 4.99 MIL/uL (ref 3.80–5.20)
RDW: 16.5 % — ABNORMAL HIGH (ref 11.5–14.5)
WBC: 5.7 10*3/uL (ref 3.6–11.0)

## 2014-11-10 LAB — MAGNESIUM: MAGNESIUM: 1.9 mg/dL (ref 1.7–2.4)

## 2014-11-10 MED ORDER — SODIUM CHLORIDE 0.9 % IJ SOLN
10.0000 mL | INTRAMUSCULAR | Status: AC | PRN
  Administered 2014-11-10: 10 mL
  Filled 2014-11-10: qty 10

## 2014-11-10 MED ORDER — HEPARIN SOD (PORK) LOCK FLUSH 100 UNIT/ML IV SOLN
500.0000 [IU] | Freq: Once | INTRAVENOUS | Status: AC
  Administered 2014-11-10: 500 [IU] via INTRAVENOUS

## 2014-11-10 MED ORDER — HEPARIN SOD (PORK) LOCK FLUSH 100 UNIT/ML IV SOLN
INTRAVENOUS | Status: AC
  Filled 2014-11-10: qty 5

## 2014-11-10 MED ORDER — FENTANYL 75 MCG/HR TD PT72: 75.0000 ug | MEDICATED_PATCH | TRANSDERMAL | Status: DC

## 2014-11-10 MED ORDER — OXYCODONE HCL 15 MG PO TABS: 30.0000 mg | ORAL_TABLET | ORAL | Status: DC | PRN

## 2014-11-10 NOTE — Progress Notes (Signed)
Patient needs refills for Fentanyl 75 mcg, Oxycodone 15 mg and Mirtazapine 15 mg and Mirtazapine 15 mg.  Also asking for prescription for Magic Mouthwash,  States she is unable to move her left leg.  Unable to stand without help since radiation.  Has lots of questions for MD.  Also hard to swallow.

## 2014-11-10 NOTE — Progress Notes (Unsigned)
patietnt did not receive infusion today due to insurance problem

## 2014-11-10 NOTE — Progress Notes (Signed)
Emily Zamora @ [facility] [phone]  [phone] [place]: Sep 05, 1955  MR#: [id number]  QIH#:[id number]  Patient Care Team: Ashok Norris, MD as PCP - General (Family Medicine)  CHIEF COMPLAINT:  Chief Complaint  Patient presents with  . 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 abdominal paracentesis x1 negative for malignant cells(22nd September)  2.PET scan shows extensive disease with lymph node metastases and bone metastases T1 N0 M1 disease biopsy is consistent with adenocarcinoma GYN or lesion.  Most likely from the endometrial cancer.  Estrogen receptor positive.  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 abdominal paracentesis x1 negative for malignant cells(22nd September)  2.PET scan shows extensive disease with lymph node metastases and bone metastases T1 N0 M1 disease biopsy is consistent with adenocarcinoma GYN or lesion.  Most likely from the endometrial cancer.  Estrogen receptor positive.  HER-2/neu receptor pending(October, 2015)  3.patient started on chemotherapy with carboplatinum and Taxolfrom November 06, 2013 4.acute pulmonary embolism February of 2016 on xeralto. 5.  Because of insurance not paying for [place] patient has been switched over to Coumadin (May, 2016) 6.  Now on maintenance a  Avastin (April, 2016)   6.  Progressing disease by CT scan and MRI scan (October, 2016) 7.  His and has finished radiation therapy to the spine    Oncology Flowsheet 08/05/2014 08/27/2014 09/17/2014 10/08/2014 10/12/2014 10/15/2014 10/20/2014  bevacizumab (AVASTIN) IV 10 mg/kg 10 mg/kg 10 mg/kg 10 mg/kg - - -  denosumab (XGEVA) [place] - - - - - - 120 mg  dexamethasone (DECADRON) IV - - - - - 8 mg -  ondansetron (ZOFRAN) IV - - - - 4 mg - -    INTERVAL HISTORY:  59 year old lady with stage IV recurrent carcinoma of endometrium having increasing pain in low back area pain radiating down to left lower extremity.  Since last evaluation patient had been in the emergency room twice.  All the records from the emergency room and been reviewed. Patient had MRI scan which is revealed the soft tissue mass invading into thoracic spine. Patient continues to have pain.  Here for further follow-up with the family member to discuss the results and further planning of treatment Complaining of weakness and pain in the left lower extremity.  Pain is better after patient has finished radiation therapy.  Here for further evaluation and treatment consideration and to initiate chemotherapy On fentanyl drip as 75 g with oxycodone 15 mg  REVIEW OF SYSTEMS:    general status: Patient is feeling weak and tired.  No change in a performance status.  No chills.  No fever. HEENT:   No evidence of stomatitis Lungs: No cough or shortness of  breath Cardiac: No chest pain or paroxysmal nocturnal dyspnea GI: No nausea no vomiting no diarrhea no abdominal pain Skin: No rash Lower extremity no swelling Neurological system: No tingling.  No numbness.  No other focal signs Low back pain and pain radiating down to lower extremity.  Constant dull aching throbbing pain All other systems reviewed as per history of present illness  PAST MEDICAL HISTORY: Past Medical History  Diagnosis Date  . Cancer ([place])   . Primary cancer of  endometrium ([place]) 05/24/2014  . Hypertension   . Knee pain, bilateral   . Arthritis   . Renal insufficiency     Significant History/PMH:   Stage 4 peritoneal Cancer:    Cirrhosis:    GERD:    endometerial cancer:    Arthritis:    Hypertension:    Migraines:    Hysterectomy:    Cholecystectomy:    Dilation and Curretage: with hysteroscopy, Aug 2012 FAMILY HISTORY Family History  Problem Relation Age of Onset  . Breast cancer Sister 77  . Hypertension Sister   . Diabetes Mother   . Hypertension Mother       ADVANCED DIRECTIVES:does have advanced healthcare directive   HEALTH MAINTENANCE: Social History  Substance Use Topics  . Smoking status: Never Smoker   . Smokeless tobacco: Not on file  . Alcohol Use: No    :  Allergies  Allergen Reactions  . Sulfa Antibiotics Anaphylaxis    Current Outpatient Prescriptions  Medication Sig Dispense Refill  . aspirin 325 MG tablet Take 325 mg by mouth daily.    Marland Kitchen atenolol (TENORMIN) 100 MG tablet Take 1 tablet (100 mg total) by mouth daily. 30 tablet 5  . citalopram (CELEXA) 40 MG tablet Take 1 tablet (40 mg total) by mouth daily. 30 tablet 2  . [facility] SENNA PLUS 8.6-50 MG per tablet TAKE 1 TABLET BY MOUTH TWICE A DAY 60 tablet 3  . cyclobenzaprine (FLEXERIL) 5 MG tablet Take 1 tablet (5 mg total) by mouth 3 (three) times daily as needed for muscle spasms. 30 tablet 3  . dexamethasone (DECADRON) 4 MG tablet Take 2 tablets (8 mg total) by mouth 2 (two) times daily. Start the day before Taxotere. 30 tablet 1  . famotidine (PEPCID) 20 MG tablet Take 1 tablet (20 mg total) by mouth 2 (two) times daily. 60 tablet 5  . fentaNYL (DURAGESIC - DOSED MCG/HR) 75 MCG/HR Place 1 patch (75 mcg total) onto the skin every 3 (three) days. 10 patch 0  . lidocaine-prilocaine (EMLA) cream Apply 1 application topically as needed. 30 g 3  . loratadine (CLARITIN) 10 MG tablet Take 1 tablet (10 mg total) by mouth daily. 30 tablet 5  .  mirtazapine (REMERON) 15 MG tablet TAKE 1 TABLET BY MOUTH AT BEDTIME 30 tablet 3  . Multiple Vitamin (MULTIVITAMIN) capsule Take by mouth.    . ondansetron (ZOFRAN) 4 MG tablet Take 1 tablet (4 mg total) by mouth every 6 (six) hours as needed. 30 tablet 3  . oxyCODONE (ROXICODONE) 15 MG immediate release tablet Take 2 tablets (30 mg total) by mouth every 2 (two) hours as needed for pain. 90 tablet 0  . psyllium (METAMUCIL) 58.6 % powder Take 1 packet by mouth 3 (three) times daily.    Marland Kitchen warfarin (COUMADIN) 5 MG tablet Take 1 tablet (5 mg total) by mouth daily at 6 PM. 30 tablet 1   No current facility-administered medications for this visit.   Facility-Administered Medications Ordered in Other  Visits  Medication Dose Route Frequency Provider Last Rate Last Dose  . sodium chloride 0.9 % injection 10 mL  10 mL Intracatheter PRN Forest Gleason, MD   10 mL at 11/10/14 0837    OBJECTIVE:  Filed Vitals:   11/10/14 0856  BP: 118/86  Pulse: 82  Temp: 96.4 F (35.8 C)     Body mass index is 28.31 kg/(m^2).    ECOG FS:1 - Symptomatic but completely ambulatory  PHYSICAL EXAM: GENERAL:  Well developed, well nourished, sitting comfortably in the exam room in no acute distress. MENTAL STATUS:  Alert and oriented to person, place and time. HEAD:  Alopecia.  Normocephalic, atraumatic, face symmetric, no Cushingoid features. EYES: pale  Pupils equal round and reactive to light and accomodation.  No conjunctivitis or scleral icterus. ENT:  Oropharynx clear without lesion.  Tongue normal. Mucous membranes moist.  RESPIRATORY:  Clear to auscultation without rales, wheezes or rhonchi. CARDIOVASCULAR:  Regular rate and rhythm without murmur, rub or gallop. . ABDOMEN:  Soft, non-tender, with active bowel sounds, and no hepatosplenomegaly.  No masses. Tenderness in lower abdominal area.  No palpable masses. BACK: Tenderness in the low back area.  No swelling.  No deformity. SKIN:  No rashes, ulcers or  lesions. EXTREMITIES: No edema, no skin discoloration or tenderness.  No palpable cords. LYMPH NODES: No palpable cervical, supraclavicular, axillary or inguinal adenopathy  NEUROLOGICAL: As per history of present illness weakness in the left lower extremity PSYCH:  Less depressed LAB RESULTS:  Infusion on 11/10/2014  Component Date Value Ref Range Status  . WBC 11/10/2014 5.7  3.6 - 11.0 K/uL Final  . RBC 11/10/2014 4.99  3.80 - 5.20 MIL/uL Final  . Hemoglobin 11/10/2014 14.6  12.0 - 16.0 g/dL Final  . HCT 11/10/2014 43.8  35.0 - 47.0 % Final  . MCV 11/10/2014 87.7  80.0 - 100.0 fL Final  . MCH 11/10/2014 29.2  26.0 - 34.0 pg Final  . MCHC 11/10/2014 33.3  32.0 - 36.0 g/dL Final  . RDW 11/10/2014 16.5* 11.5 - 14.5 % Final  . Platelets 11/10/2014 125* 150 - 440 K/uL Final  . Neutrophils Relative % 11/10/2014 80   Final  . Neutro Abs 11/10/2014 4.5  1.4 - 6.5 K/uL Final  . Lymphocytes Relative 11/10/2014 11   Final  . Lymphs Abs 11/10/2014 0.6* 1.0 - 3.6 K/uL Final  . Monocytes Relative 11/10/2014 8   Final  . Monocytes Absolute 11/10/2014 0.5  0.2 - 0.9 K/uL Final  . Eosinophils Relative 11/10/2014 1   Final  . Eosinophils Absolute 11/10/2014 0.1  0 - 0.7 K/uL Final  . Basophils Relative 11/10/2014 0   Final  . Basophils Absolute 11/10/2014 0.0  0 - 0.1 K/uL Final  . Sodium 11/10/2014 133* 135 - 145 mmol/L Final  . Potassium 11/10/2014 4.2  3.5 - 5.1 mmol/L Final  . Chloride 11/10/2014 100* 101 - 111 mmol/L Final  . CO2 11/10/2014 24  22 - 32 mmol/L Final  . Glucose, Bld 11/10/2014 97  65 - 99 mg/dL Final  . BUN 11/10/2014 33* 6 - 20 mg/dL Final  . Creatinine, Ser 11/10/2014 1.28* 0.44 - 1.00 mg/dL Final  . Calcium 11/10/2014 9.2  8.9 - 10.3 mg/dL Final  . Total Protein 11/10/2014 6.5  6.5 - 8.1 g/dL Final  . Albumin 11/10/2014 3.6  3.5 - 5.0 g/dL Final  . AST 11/10/2014 37  15 - 41 U/L Final  . ALT 11/10/2014 24  14 - 54  U/L Final  . Alkaline Phosphatase 11/10/2014 162* 38 -  126 U/L Final  . Total Bilirubin 11/10/2014 0.7  0.3 - 1.2 mg/dL Final  . GFR calc non Af Amer 11/10/2014 44* >60 mL/min Final  . GFR calc Af Amer 11/10/2014 51* >60 mL/min Final   Comment: (NOTE) The eGFR has been calculated using the CKD EPI equation. This calculation has not been validated in all clinical situations. eGFR's persistently <60 mL/min signify possible Chronic Kidney Disease.   . Anion gap 11/10/2014 9  5 - 15 Final  . Magnesium 11/10/2014 1.9  1.7 - 2.4 mg/dL Final     Lab Results  Component Value Date   CA125 23.9 09/17/2014   Lab Results  Component Value Date   CEA 3.5 07/15/2014    Lab Results  Component Value Date   CA125 23.9 09/17/2014    ASSESSMENT: Carcinoma of endometrium recurrent disease with pericorneal implants and ascites Constant dull aching pain in low back area CT scan done in the emergency room as well as emergency room records have been reviewed independently.  CT scan shows increase in size of the retroperitoneal lymph node Patient has finished radiation therapy to the spine with some relief in the pain  Executive etiology of weakness in the left lower extremity is not clear I discussed situation with radiation oncologist and does some more radiation to the pelvic area has been planned Weakness is due pain or neurological impairment is not clear at present time and will be observed and monitored  Physiotherapy has been recommended Pain has improved on fentanyl patch 75 g and oxycodone 15 mg  Patient has a platinum sensitive disease So cis-platinum and Taxotere would be started repeated every 3 weeks apart Taxol would be oh meted and Taxotere would be replaced because of previous history of rash and some of the side effect due to Taxol.  Delton See would be continued We will hold off Neulasta because of radiation may be started in the pelvic bone area as per Dr. Donella Stade physiotherapy for weakness in the left lower extremity Patient was  explained all the side effects of chemotherapy.  And informed consent has been obtained Intent of chemotherapy is palliation and relief in symptoms and extending survival  options of treatment and available resources Patient expressed understanding and was in agreement with this plan. She also understands that She can call clinic at any time with any questions, concerns, or complaints.    Primary cancer of endometrium   Staging form: Corpus Uteri - Carcinoma, AJCC 7th Edition     Clinical: Stage IVB (T1a, N0, M1) - Signed by Forest Gleason, MD on 05/24/2014     Pathologic: No stage assigned - Marni Griffon, MD   11/10/2014 9:21 AM

## 2014-11-11 ENCOUNTER — Telehealth: Payer: Self-pay | Admitting: *Deleted

## 2014-11-11 NOTE — Telephone Encounter (Signed)
Due to insurance they cannot take this patient, they are working on contract with her insurance

## 2014-11-11 NOTE — Telephone Encounter (Signed)
[facility] for [facility] therapy.

## 2014-11-12 ENCOUNTER — Other Ambulatory Visit: Payer: Self-pay | Admitting: *Deleted

## 2014-11-12 ENCOUNTER — Ambulatory Visit
Admission: RE | Admit: 2014-11-12 | Discharge: 2014-11-12 | Disposition: A | Discharge status: Home / Self Care | Payer: No Typology Code available for payment source | Source: Ambulatory Visit | Attending: Radiation Oncology | Admitting: Radiation Oncology

## 2014-11-12 ENCOUNTER — Encounter: Payer: Self-pay | Admitting: Radiation Oncology

## 2014-11-12 VITALS — BP 110/78 | HR 103 | Temp 95.7°F | Resp 18 | Wt 166.3 lb

## 2014-11-12 DIAGNOSIS — C7951 Secondary malignant neoplasm of bone: Secondary | ICD-10-CM

## 2014-11-12 DIAGNOSIS — C419 Malignant neoplasm of bone and articular cartilage, unspecified: Secondary | ICD-10-CM

## 2014-11-12 MED ORDER — SUCRALFATE 1 G PO TABS: 1.0000 g | ORAL_TABLET | Freq: Three times a day (TID) | ORAL | Status: AC

## 2014-11-12 NOTE — Progress Notes (Signed)
Radiation Oncology Follow up Note  Name: Emily Zamora   Date:   11/12/2014 MRN:  [id number] DOB: May 24, 1955    This 59 y.o. female presents to the clinic today for follow-up for palliative radiation therapy for stage IV endometrial cancer.  REFERRING PROVIDER: Forest Gleason, MD  HPI: Patient is a 59 year old female now 1 month out having completed palliative radiation therapy to her thoracic spine for presumed stage IV endometrial cancer with bone involvement. MRI scan shows left acetabulum L5 vertebral body involvement and patient continues to have one month out significant decreased strength in her left lower extremity as well as left hip and radicular type left lower extremity pain. She has also having some dysphasia probably from radiation esophagitis which is resolving. No other significant areas of pain at this time.. Patient is in 10 years on narcotic analgesics for pain control.  COMPLICATIONS OF TREATMENT: none  FOLLOW UP COMPLIANCE: keeps appointments   PHYSICAL EXAM:  BP 110/78 mmHg  Pulse 103  Temp(Src) 95.7 F (35.4 C)  Resp 18  Wt 166 lb 5.4 oz (75.45 kg) Well-developed wheelchair-bound female in NAD. She has subtle decreased strength in her left lower extremity. Proprioception is intact no sensory levels identified. No pain is elicited on deep palpation of her spine. Well-developed well-nourished patient in NAD. HEENT reveals PERLA, EOMI, discs not visualized.  Oral cavity is clear. No oral mucosal lesions are identified. Neck is clear without evidence of cervical or supraclavicular adenopathy. Lungs are clear to A&P. Cardiac examination is essentially unremarkable with regular rate and rhythm without murmur rub or thrill. Abdomen is benign with no organomegaly or masses noted. Motor sensory and DTR levels are equal and symmetric in the upper and lower extremities. Cranial nerves II through XII are grossly intact. Proprioception is intact. No peripheral adenopathy or edema is  identified. No motor or sensory levels are noted. Crude visual fields are within normal range.  RADIOLOGY RESULTS: Previous MRI scans are reviewed bone scan has been ordered  PLAN: Present time patient is slated to start chemotherapy under medical oncology's direction. I've ordered a bone scan. Would like to touch upper lumbar spine and possible left hip where she's having increased pain and decreased strength in her left lower extremity. Will review her bone scan and see her back a few days later for follow-up. We'll also discussed the case with medical oncology although I believe it safe to proceed Emily Zamora systemic chemotherapy at this time.  I would like to take this opportunity for allowing me to participate in the care of your patient.Armstead Peaks., MD

## 2014-11-16 ENCOUNTER — Other Ambulatory Visit: Payer: Self-pay | Admitting: Oncology

## 2014-11-17 ENCOUNTER — Inpatient Hospital Stay: Payer: No Typology Code available for payment source

## 2014-11-17 VITALS — BP 109/70 | HR 97 | Temp 97.7°F | Resp 18

## 2014-11-17 DIAGNOSIS — Z5111 Encounter for antineoplastic chemotherapy: Secondary | ICD-10-CM | POA: Diagnosis not present

## 2014-11-17 DIAGNOSIS — C541 Malignant neoplasm of endometrium: Secondary | ICD-10-CM

## 2014-11-17 MED ORDER — SODIUM CHLORIDE 0.9 % IV SOLN
Freq: Once | INTRAVENOUS | Status: AC
  Administered 2014-11-17: 12:00:00 via INTRAVENOUS
  Filled 2014-11-17: qty 5

## 2014-11-17 MED ORDER — POTASSIUM CHLORIDE 2 MEQ/ML IV SOLN
Freq: Once | INTRAVENOUS | Status: AC
  Administered 2014-11-17: 10:00:00 via INTRAVENOUS
  Filled 2014-11-17: qty 1000

## 2014-11-17 MED ORDER — PALONOSETRON HCL INJECTION 0.25 MG/5ML
0.2500 mg | Freq: Once | INTRAVENOUS | Status: AC
  Administered 2014-11-17: 0.25 mg via INTRAVENOUS
  Filled 2014-11-17: qty 5

## 2014-11-17 MED ORDER — SODIUM CHLORIDE 0.9 % IJ SOLN
10.0000 mL | INTRAMUSCULAR | Status: DC | PRN
  Administered 2014-11-17: 10 mL
  Filled 2014-11-17: qty 10

## 2014-11-17 MED ORDER — SODIUM CHLORIDE 0.9 % IV SOLN
Freq: Once | INTRAVENOUS | Status: AC
  Administered 2014-11-17: 10:00:00 via INTRAVENOUS
  Filled 2014-11-17: qty 1000

## 2014-11-17 MED ORDER — HEPARIN SOD (PORK) LOCK FLUSH 100 UNIT/ML IV SOLN
500.0000 [IU] | Freq: Once | INTRAVENOUS | Status: AC | PRN
  Administered 2014-11-17: 500 [IU]
  Filled 2014-11-17: qty 5

## 2014-11-17 MED ORDER — SODIUM CHLORIDE 0.9 % IV SOLN
50.0000 mg/m2 | Freq: Once | INTRAVENOUS | Status: AC
  Administered 2014-11-17: 95 mg via INTRAVENOUS
  Filled 2014-11-17: qty 95

## 2014-11-17 MED ORDER — DOCETAXEL CHEMO INJECTION 160 MG/16ML
60.0000 mg/m2 | Freq: Once | INTRAVENOUS | Status: AC
  Administered 2014-11-17: 110 mg via INTRAVENOUS
  Filled 2014-11-17: qty 11

## 2014-11-17 MED ORDER — DENOSUMAB 120 MG/1.7ML ~~LOC~~ SOLN
120.0000 mg | Freq: Once | SUBCUTANEOUS | Status: AC
  Administered 2014-11-17: 120 mg via SUBCUTANEOUS
  Filled 2014-11-17: qty 1.7

## 2014-11-18 ENCOUNTER — Other Ambulatory Visit: Payer: Self-pay | Admitting: *Deleted

## 2014-11-21 ENCOUNTER — Emergency Department: Payer: No Typology Code available for payment source

## 2014-11-21 ENCOUNTER — Encounter: Payer: Self-pay | Admitting: *Deleted

## 2014-11-21 ENCOUNTER — Telehealth: Payer: Self-pay | Admitting: *Deleted

## 2014-11-21 ENCOUNTER — Ambulatory Visit: Payer: No Typology Code available for payment source

## 2014-11-21 ENCOUNTER — Inpatient Hospital Stay
Admission: EM | Admit: 2014-11-21 | Discharge: 2014-11-29 | DRG: 808 | Disposition: A | Discharge status: Home Health | Payer: No Typology Code available for payment source | Attending: Internal Medicine | Admitting: Internal Medicine

## 2014-11-21 DIAGNOSIS — C541 Malignant neoplasm of endometrium: Secondary | ICD-10-CM | POA: Diagnosis not present

## 2014-11-21 DIAGNOSIS — R509 Fever, unspecified: Secondary | ICD-10-CM | POA: Diagnosis present

## 2014-11-21 DIAGNOSIS — E875 Hyperkalemia: Secondary | ICD-10-CM | POA: Diagnosis not present

## 2014-11-21 DIAGNOSIS — Z86718 Personal history of other venous thrombosis and embolism: Secondary | ICD-10-CM | POA: Diagnosis not present

## 2014-11-21 DIAGNOSIS — Z86711 Personal history of pulmonary embolism: Secondary | ICD-10-CM

## 2014-11-21 DIAGNOSIS — M1990 Unspecified osteoarthritis, unspecified site: Secondary | ICD-10-CM | POA: Diagnosis present

## 2014-11-21 DIAGNOSIS — I10 Essential (primary) hypertension: Secondary | ICD-10-CM | POA: Diagnosis present

## 2014-11-21 DIAGNOSIS — N289 Disorder of kidney and ureter, unspecified: Secondary | ICD-10-CM | POA: Diagnosis present

## 2014-11-21 DIAGNOSIS — Z7982 Long term (current) use of aspirin: Secondary | ICD-10-CM

## 2014-11-21 DIAGNOSIS — M5440 Lumbago with sciatica, unspecified side: Secondary | ICD-10-CM | POA: Diagnosis present

## 2014-11-21 DIAGNOSIS — Z79899 Other long term (current) drug therapy: Secondary | ICD-10-CM

## 2014-11-21 DIAGNOSIS — D709 Neutropenia, unspecified: Secondary | ICD-10-CM

## 2014-11-21 DIAGNOSIS — A4151 Sepsis due to Escherichia coli [E. coli]: Secondary | ICD-10-CM | POA: Diagnosis present

## 2014-11-21 DIAGNOSIS — R197 Diarrhea, unspecified: Secondary | ICD-10-CM | POA: Diagnosis present

## 2014-11-21 DIAGNOSIS — N3000 Acute cystitis without hematuria: Secondary | ICD-10-CM | POA: Diagnosis present

## 2014-11-21 DIAGNOSIS — Z7901 Long term (current) use of anticoagulants: Secondary | ICD-10-CM

## 2014-11-21 DIAGNOSIS — R5081 Fever presenting with conditions classified elsewhere: Secondary | ICD-10-CM | POA: Diagnosis present

## 2014-11-21 DIAGNOSIS — D6481 Anemia due to antineoplastic chemotherapy: Secondary | ICD-10-CM | POA: Diagnosis present

## 2014-11-21 DIAGNOSIS — Z882 Allergy status to sulfonamides status: Secondary | ICD-10-CM

## 2014-11-21 DIAGNOSIS — M545 Low back pain: Secondary | ICD-10-CM

## 2014-11-21 DIAGNOSIS — D701 Agranulocytosis secondary to cancer chemotherapy: Principal | ICD-10-CM | POA: Diagnosis present

## 2014-11-21 DIAGNOSIS — R0602 Shortness of breath: Secondary | ICD-10-CM

## 2014-11-21 DIAGNOSIS — E871 Hypo-osmolality and hyponatremia: Secondary | ICD-10-CM | POA: Diagnosis present

## 2014-11-21 DIAGNOSIS — D696 Thrombocytopenia, unspecified: Secondary | ICD-10-CM

## 2014-11-21 DIAGNOSIS — D6959 Other secondary thrombocytopenia: Secondary | ICD-10-CM | POA: Diagnosis present

## 2014-11-21 DIAGNOSIS — C641 Malignant neoplasm of right kidney, except renal pelvis: Secondary | ICD-10-CM | POA: Diagnosis not present

## 2014-11-21 DIAGNOSIS — Z79891 Long term (current) use of opiate analgesic: Secondary | ICD-10-CM | POA: Diagnosis not present

## 2014-11-21 DIAGNOSIS — T451X5A Adverse effect of antineoplastic and immunosuppressive drugs, initial encounter: Secondary | ICD-10-CM | POA: Diagnosis present

## 2014-11-21 DIAGNOSIS — N390 Urinary tract infection, site not specified: Secondary | ICD-10-CM | POA: Diagnosis not present

## 2014-11-21 DIAGNOSIS — D509 Iron deficiency anemia, unspecified: Secondary | ICD-10-CM | POA: Diagnosis not present

## 2014-11-21 DIAGNOSIS — E860 Dehydration: Secondary | ICD-10-CM | POA: Diagnosis present

## 2014-11-21 LAB — DIFFERENTIAL
BAND NEUTROPHILS: 0 %
Basophils Absolute: 0 10*3/uL (ref 0–0.1)
Basophils Relative: 0 %
Blasts: 0 %
EOS ABS: 0 10*3/uL (ref 0–0.7)
Eosinophils Relative: 1 %
LYMPHS ABS: 0.3 10*3/uL — AB (ref 1.0–3.6)
Lymphocytes Relative: 23 %
METAMYELOCYTES PCT: 1 %
MONO ABS: 0 10*3/uL — AB (ref 0.2–0.9)
MYELOCYTES: 0 %
Monocytes Relative: 2 %
NEUTROS ABS: 1.2 10*3/uL — AB (ref 1.4–6.5)
Neutrophils Relative %: 73 %
OTHER: 0 %
PROMYELOCYTES ABS: 0 %
nRBC: 0 /100 WBC

## 2014-11-21 LAB — CBC
HEMATOCRIT: 39 % (ref 35.0–47.0)
HEMOGLOBIN: 12.8 g/dL (ref 12.0–16.0)
MCH: 28.5 pg (ref 26.0–34.0)
MCHC: 32.9 g/dL (ref 32.0–36.0)
MCV: 86.7 fL (ref 80.0–100.0)
Platelets: 52 10*3/uL — ABNORMAL LOW (ref 150–440)
RBC: 4.5 MIL/uL (ref 3.80–5.20)
RDW: 16 % — ABNORMAL HIGH (ref 11.5–14.5)
WBC: 1.5 10*3/uL — ABNORMAL LOW (ref 3.6–11.0)

## 2014-11-21 LAB — URINALYSIS COMPLETE WITH MICROSCOPIC (ARMC ONLY)
Bilirubin Urine: NEGATIVE
Glucose, UA: NEGATIVE mg/dL
Nitrite: NEGATIVE
PROTEIN: 30 mg/dL — AB
SPECIFIC GRAVITY, URINE: 1.011 (ref 1.005–1.030)
pH: 6 (ref 5.0–8.0)

## 2014-11-21 LAB — BASIC METABOLIC PANEL
ANION GAP: 11 (ref 5–15)
BUN: 33 mg/dL — ABNORMAL HIGH (ref 6–20)
CALCIUM: 7.4 mg/dL — AB (ref 8.9–10.3)
CHLORIDE: 97 mmol/L — AB (ref 101–111)
CO2: 20 mmol/L — AB (ref 22–32)
Creatinine, Ser: 1.18 mg/dL — ABNORMAL HIGH (ref 0.44–1.00)
GFR calc non Af Amer: 49 mL/min — ABNORMAL LOW (ref >60)
GFR, EST AFRICAN AMERICAN: 56 mL/min — AB (ref >60)
Glucose, Bld: 139 mg/dL — ABNORMAL HIGH (ref 65–99)
POTASSIUM: 5.1 mmol/L (ref 3.5–5.1)
Sodium: 128 mmol/L — ABNORMAL LOW (ref 135–145)

## 2014-11-21 LAB — PROTIME-INR
INR: 2.33
PROTHROMBIN TIME: 25.3 s — AB (ref 11.4–15.0)

## 2014-11-21 LAB — LACTIC ACID, PLASMA: Lactic Acid, Venous: 1.9 mmol/L (ref 0.5–2.0)

## 2014-11-21 LAB — LIPASE, BLOOD: LIPASE: 21 U/L (ref 11–51)

## 2014-11-21 LAB — C DIFFICILE QUICK SCREEN W PCR REFLEX
C Diff antigen: NEGATIVE
C Diff interpretation: NEGATIVE
C Diff toxin: NEGATIVE

## 2014-11-21 MED ORDER — SENNOSIDES-DOCUSATE SODIUM 8.6-50 MG PO TABS
1.0000 | ORAL_TABLET | Freq: Two times a day (BID) | ORAL | Status: DC
  Administered 2014-11-21 – 2014-11-29 (×13): 1 via ORAL
  Filled 2014-11-21 (×16): qty 1

## 2014-11-21 MED ORDER — FENTANYL 50 MCG/HR TD PT72
75.0000 ug | MEDICATED_PATCH | TRANSDERMAL | Status: DC
  Administered 2014-11-21 – 2014-11-27 (×3): 75 ug via TRANSDERMAL
  Filled 2014-11-21 (×3): qty 1

## 2014-11-21 MED ORDER — FAMOTIDINE 20 MG PO TABS
20.0000 mg | ORAL_TABLET | Freq: Two times a day (BID) | ORAL | Status: DC
  Administered 2014-11-21 – 2014-11-29 (×16): 20 mg via ORAL
  Filled 2014-11-21 (×16): qty 1

## 2014-11-21 MED ORDER — MIRTAZAPINE 15 MG PO TABS
15.0000 mg | ORAL_TABLET | Freq: Every day | ORAL | Status: DC
  Administered 2014-11-21 – 2014-11-28 (×8): 15 mg via ORAL
  Filled 2014-11-21 (×10): qty 1

## 2014-11-21 MED ORDER — SODIUM CHLORIDE 0.9 % IV SOLN
INTRAVENOUS | Status: DC
  Administered 2014-11-21 – 2014-11-26 (×9): via INTRAVENOUS

## 2014-11-21 MED ORDER — CYCLOBENZAPRINE HCL 10 MG PO TABS
5.0000 mg | ORAL_TABLET | Freq: Three times a day (TID) | ORAL | Status: DC | PRN
  Administered 2014-11-24 – 2014-11-27 (×2): 5 mg via ORAL
  Filled 2014-11-21 (×2): qty 1

## 2014-11-21 MED ORDER — ONDANSETRON HCL 4 MG PO TABS: 4.0000 mg | ORAL_TABLET | Freq: Three times a day (TID) | ORAL | Status: DC | PRN

## 2014-11-21 MED ORDER — SUCRALFATE 1 G PO TABS
1.0000 g | ORAL_TABLET | Freq: Three times a day (TID) | ORAL | Status: DC
  Administered 2014-11-21 – 2014-11-29 (×17): 1 g via ORAL
  Filled 2014-11-21 (×25): qty 1

## 2014-11-21 MED ORDER — ADULT MULTIVITAMIN W/MINERALS CH
1.0000 | ORAL_TABLET | Freq: Every day | ORAL | Status: DC
Start: 2014-11-21 — End: 2014-11-29
  Administered 2014-11-21 – 2014-11-29 (×9): 1 via ORAL
  Filled 2014-11-21 (×9): qty 1

## 2014-11-21 MED ORDER — OXYCODONE HCL 5 MG PO TABS
30.0000 mg | ORAL_TABLET | Freq: Four times a day (QID) | ORAL | Status: DC | PRN
  Administered 2014-11-23 – 2014-11-28 (×13): 30 mg via ORAL
  Filled 2014-11-21 (×14): qty 6

## 2014-11-21 MED ORDER — DEXTROSE 5 % IV SOLN
2.0000 g | Freq: Three times a day (TID) | INTRAVENOUS | Status: DC
  Administered 2014-11-21 – 2014-11-23 (×6): 2 g via INTRAVENOUS
  Filled 2014-11-21 (×8): qty 2

## 2014-11-21 MED ORDER — LORATADINE 10 MG PO TABS
10.0000 mg | ORAL_TABLET | Freq: Every day | ORAL | Status: DC
  Administered 2014-11-21 – 2014-11-28 (×8): 10 mg via ORAL
  Filled 2014-11-21 (×9): qty 1

## 2014-11-21 MED ORDER — CITALOPRAM HYDROBROMIDE 20 MG PO TABS
40.0000 mg | ORAL_TABLET | Freq: Every day | ORAL | Status: DC
  Administered 2014-11-21 – 2014-11-28 (×8): 40 mg via ORAL
  Filled 2014-11-21 (×9): qty 2

## 2014-11-21 MED ORDER — ATENOLOL 100 MG PO TABS
100.0000 mg | ORAL_TABLET | Freq: Every day | ORAL | Status: DC
  Administered 2014-11-21 – 2014-11-28 (×8): 100 mg via ORAL
  Filled 2014-11-21 (×10): qty 1

## 2014-11-21 MED ORDER — WARFARIN SODIUM 5 MG PO TABS
5.0000 mg | ORAL_TABLET | Freq: Every day | ORAL | Status: DC
  Administered 2014-11-21 – 2014-11-22 (×2): 5 mg via ORAL
  Filled 2014-11-21 (×2): qty 1

## 2014-11-21 MED ORDER — DEXAMETHASONE SODIUM PHOSPHATE 100 MG/10ML IJ SOLN
8.0000 mg | Freq: Once | INTRAMUSCULAR | Status: AC
  Administered 2014-11-21: 18:00:00 8 mg via INTRAVENOUS
  Filled 2014-11-21: qty 0.8

## 2014-11-21 MED ORDER — ASPIRIN 325 MG PO TABS
325.0000 mg | ORAL_TABLET | Freq: Every day | ORAL | Status: DC
  Administered 2014-11-21 – 2014-11-26 (×6): 325 mg via ORAL
  Filled 2014-11-21 (×8): qty 1

## 2014-11-21 MED ORDER — SODIUM CHLORIDE 0.9 % IV BOLUS (SEPSIS)
1000.0000 mL | Freq: Once | INTRAVENOUS | Status: AC
  Administered 2014-11-21: 1000 mL via INTRAVENOUS

## 2014-11-21 NOTE — H&P (Signed)
[name] NAME: Emily Zamora    MR#:  [id number]  DATE OF BIRTH:  [birth year]  DATE OF ADMISSION:  11/21/2014  PRIMARY CARE PHYSICIAN: Ashok Norris, MD   REQUESTING/REFERRING PHYSICIAN: Quale  CHIEF COMPLAINT:   Chief Complaint  Patient presents with  . Weakness    HISTORY OF PRESENT ILLNESS: Emily Zamora  is a 59 y.o. female with a known history of endometrial carcinoma with metastasis, recently started on second round of her chemotherapy cycles and just received her first chemotherapy 4 days ago- also have hypertension some arthritis. After chemotherapy on Monday and Tuesday she was fine but then Wednesday she had a fever of 10 57F- she took some Tylenol felt better for a few hours but then again yesterday she had fever- she called the cancer Center they advised to take Tylenol and if the fever does not go it and come to emergency room. So today she decided to come to ER. She was noted to have neutropenia, chest x-ray negative. Urinalysis urine culture and blood cultures are collected and she is given to hospitalist team for further management.  She also complains of having diarrhea without any bleeding nausea or vomiting for last 2-3 days.  PAST MEDICAL HISTORY:   Past Medical History  Diagnosis Date  . Cancer ([place])   . Primary cancer of endometrium ([place]) 05/24/2014  . Hypertension   . Knee pain, bilateral   . Arthritis   . Renal insufficiency     PAST SURGICAL HISTORY:  Past Surgical History  Procedure Laterality Date  . Gallbladder surgery  2014  . Abdominal hysterectomy      SOCIAL HISTORY:  Social History  Substance Use Topics  . Smoking status: Never Smoker   . Smokeless tobacco: Not on file  . Alcohol Use: No    FAMILY HISTORY:  Family History  Problem Relation Age of Onset  . Breast cancer Sister 74  . Hypertension Sister   . Diabetes Mother   . Hypertension Mother     DRUG ALLERGIES:   Allergies  Allergen Reactions  . Sulfa Antibiotics Anaphylaxis    REVIEW OF SYSTEMS:   CONSTITUTIONAL: Positive for fever, fatigue or weakness.  EYES: No blurred or double vision.  EARS, NOSE, AND THROAT: No tinnitus or ear pain.  RESPIRATORY: No cough, shortness of breath, wheezing or hemoptysis.  CARDIOVASCULAR: No chest pain, orthopnea, edema.  GASTROINTESTINAL: No nausea, vomiting, positive for diarrhea but no abdominal pain.  GENITOURINARY: No dysuria, hematuria.  ENDOCRINE: No polyuria, nocturia,  HEMATOLOGY: No anemia, easy bruising or bleeding SKIN: No rash or lesion. MUSCULOSKELETAL: No joint pain or arthritis.   NEUROLOGIC: No tingling, numbness, weakness.  PSYCHIATRY: No anxiety or depression.   MEDICATIONS AT HOME:  Prior to Admission medications   Medication Sig Start Date End Date Taking? Authorizing Provider  aspirin 325 MG tablet Take 325 mg by mouth daily.    Historical Provider, MD  atenolol (TENORMIN) 100 MG tablet Take 1 tablet (100 mg total) by mouth daily. 09/02/14   Ashok Norris, MD  citalopram (CELEXA) 40 MG tablet Take 1 tablet (40 mg total) by mouth daily. 08/05/14   Forest Gleason, MD  [facility] SENNA PLUS 8.6-50 MG per tablet TAKE 1 TABLET BY MOUTH TWICE A DAY 08/06/14   Forest Gleason, MD  cyclobenzaprine (FLEXERIL) 5 MG tablet TAKE 1 TABLET BY MOUTH 3 TIMES DAILY AS NEEDED FOR MUSCLE SPASMS. 11/17/14   Forest Gleason, MD  dexamethasone (DECADRON) 4 MG tablet Take 2 tablets (8 mg total) by mouth 2 (two) times daily. Start the day before Taxotere. 11/07/14   Forest Gleason, MD  famotidine (PEPCID) 20 MG tablet Take 1 tablet (20 mg total) by mouth 2 (two) times daily. 09/02/14   Ashok Norris, MD  fentaNYL (DURAGESIC - DOSED MCG/HR) 75 MCG/HR Place 1 patch (75 mcg total) onto the skin every 3 (three) days. 11/10/14   Forest Gleason, MD  lidocaine-prilocaine (EMLA) cream Apply 1 application topically as needed. 06/24/14   Forest Gleason, MD  loratadine (CLARITIN) 10 MG tablet  Take 1 tablet (10 mg total) by mouth daily. 09/02/14   Ashok Norris, MD  mirtazapine (REMERON) 15 MG tablet TAKE 1 TABLET BY MOUTH AT BEDTIME 11/03/14   Forest Gleason, MD  Multiple Vitamin (MULTIVITAMIN) capsule Take by mouth.    Historical Provider, MD  ondansetron (ZOFRAN) 4 MG tablet Take 1 tablet (4 mg total) by mouth every 6 (six) hours as needed. 11/07/14   Forest Gleason, MD  oxyCODONE (ROXICODONE) 15 MG immediate release tablet Take 2 tablets (30 mg total) by mouth every 2 (two) hours as needed for pain. 11/10/14   Forest Gleason, MD  psyllium (METAMUCIL) 58.6 % powder Take 1 packet by mouth 3 (three) times daily.    Historical Provider, MD  sucralfate (CARAFATE) 1 G tablet Take 1 tablet (1 g total) by mouth 4 (four) times daily -  with meals and at bedtime. 11/12/14   Noreene Filbert, MD  warfarin (COUMADIN) 5 MG tablet Take 1 tablet (5 mg total) by mouth daily at 6 PM. 09/29/14   Forest Gleason, MD      PHYSICAL EXAMINATION:   VITAL SIGNS: Blood pressure 116/89, pulse 119, temperature 97.8 F (36.6 C), temperature source Oral, resp. rate 16, height 5\' 4"  (1.626 m), weight 79.379 kg (175 lb), SpO2 95 %.  GENERAL:  59 y.o.-year-old patient lying in the bed with no acute distress.  EYES: Pupils equal, round, reactive to light and accommodation. No scleral icterus. Extraocular muscles intact.  HEENT: Head atraumatic, normocephalic. Oropharynx and nasopharynx clear.  NECK:  Supple, no jugular venous distention. No thyroid enlargement, no tenderness.  LUNGS: Normal breath sounds bilaterally, no wheezing, rales,rhonchi or crepitation. No use of accessory muscles of respiration.  CARDIOVASCULAR: S1, S2 fast. No murmurs, rubs, or gallops.  ABDOMEN: Soft, nontender, nondistended. Bowel sounds present. No organomegaly or mass.  EXTREMITIES: No pedal edema, cyanosis, or clubbing.  NEUROLOGIC: Cranial nerves II through XII are intact. Muscle strength 5/5 in all extremities. Sensation intact. Gait not  checked.  PSYCHIATRIC: The patient is alert and oriented x 3.  SKIN: No obvious rash, lesion, or ulcer.   LABORATORY PANEL:   CBC  Recent Labs Lab 11/21/14 1053  WBC 1.5*  HGB 12.8  HCT 39.0  PLT 52*  MCV 86.7  MCH 28.5  MCHC 32.9  RDW 16.0*  LYMPHSABS 0.3*  MONOABS 0.0*  EOSABS 0.0  BASOSABS 0.0   ------------------------------------------------------------------------------------------------------------------  Chemistries   Recent Labs Lab 11/21/14 1053  NA 128*  K 5.1  CL 97*  CO2 20*  GLUCOSE 139*  BUN 33*  CREATININE 1.18*  CALCIUM 7.4*   ------------------------------------------------------------------------------------------------------------------ estimated creatinine clearance is 51.1 mL/min (by C-G formula based on Cr of 1.18). ------------------------------------------------------------------------------------------------------------------ No results for input(s): TSH, T4TOTAL, T3FREE, THYROIDAB in the last 72 hours.  Invalid input(s): FREET3   Coagulation profile No results for input(s): INR, PROTIME in the last 168 hours. ------------------------------------------------------------------------------------------------------------------- No results for  input(s): DDIMER in the last 72 hours. -------------------------------------------------------------------------------------------------------------------  Cardiac Enzymes No results for input(s): CKMB, TROPONINI, MYOGLOBIN in the last 168 hours.  Invalid input(s): CK ------------------------------------------------------------------------------------------------------------------ Invalid input(s): POCBNP  ---------------------------------------------------------------------------------------------------------------  Urinalysis    Component Value Date/Time   COLORURINE YELLOW* 10/12/2014 1647   COLORURINE Yellow 04/23/2014 0958   APPEARANCEUR CLEAR* 10/12/2014 1647   APPEARANCEUR Clear  04/23/2014 0958   LABSPEC 1.016 10/12/2014 1647   LABSPEC 1.019 04/23/2014 0958   PHURINE 5.0 10/12/2014 1647   PHURINE 5.0 04/23/2014 0958   GLUCOSEU NEGATIVE 10/12/2014 1647   GLUCOSEU Negative 04/23/2014 0958   HGBUR 1+* 10/12/2014 1647   HGBUR 2+ 04/23/2014 0958   BILIRUBINUR NEGATIVE 10/12/2014 1647   BILIRUBINUR Negative 04/23/2014 0958   KETONESUR NEGATIVE 10/12/2014 1647   KETONESUR Negative 04/23/2014 0958   PROTEINUR NEGATIVE 10/12/2014 1647   PROTEINUR 30 mg/dL 04/23/2014 0958   NITRITE NEGATIVE 10/12/2014 1647   NITRITE Negative 04/23/2014 0958   LEUKOCYTESUR NEGATIVE 10/12/2014 1647   LEUKOCYTESUR Trace 04/23/2014 0958     RADIOLOGY: Dg Chest Port 1 View  11/21/2014  CLINICAL DATA:  Patient with fever. On chemotherapy for uterine carcinoma. EXAM: PORTABLE CHEST 1 VIEW COMPARISON:  2/20 6/6 FINDINGS: Heart, mediastinum hila are unremarkable. Lungs are clear. No pleural effusion or pneumothorax. Bony thorax is grossly intact. Right anterior chest wall Port-A-Cath is stable and well positioned. IMPRESSION: No acute cardiopulmonary disease. Electronically Signed   By: Lajean Manes M.D.   On: 11/21/2014 11:41    EKG:  Sinus tachycardia.  IMPRESSION AND PLAN:  * Neutropenic fever  Blood culture urine culture sent, collect stool for culture and C. difficile whenever she has neck bowel movement.  Tressie Ellis for now.  Oncology consult.  * Sinus tachycardia  This may be as a result of fever and mild dehydration due to diarrhea  We'll give IV fluid and continue monitoring.  She is on atenolol at home will continue that.  * History of DVT  She is on Coumadin will continue that.  Pharmacy to help manage the dose.  * Endometrial cancer with metastasis  She is on chemotherapy will call oncology for further management.  * Hypertension  We'll continue atenolol.  All the records are reviewed and case discussed with ED provider. Management plans discussed with the  patient, family and they are in agreement.  CODE STATUS: Advance Directive Documentation        Most Recent Value   Type of Advance Directive  Healthcare Power of Attorney, Living will   Pre-existing out of facility DNR order (yellow form or pink MOST form)     "MOST" Form in Place?         TOTAL TIME TAKING CARE OF THIS PATIENT: 45 minutes.    Vaughan Basta M.D on 11/21/2014   Between 7am to 6pm - Pager - [phone]  After 6pm go to [email or web address] - password [email or web address] [facility]  Office  [phone]  CC: Primary care physician; Ashok Norris, MD   Note: This dictation was prepared with Dragon dictation along with smaller phrase technology. Any transcriptional errors that result from this process are unintentional.

## 2014-11-21 NOTE — ED Notes (Signed)
Pt reports having a chemo on Monday. On Wednesday pt reports weakness, dizziness and diarrhea. Family reports fever of 101 previous days.

## 2014-11-21 NOTE — ED Provider Notes (Signed)
[facility] Emergency Department Provider Note REMINDER - THIS NOTE IS NOT A FINAL MEDICAL RECORD UNTIL IT IS SIGNED. UNTIL THEN, THE CONTENT BELOW MAY REFLECT INFORMATION FROM A DOCUMENTATION TEMPLATE, NOT THE ACTUAL PATIENT VISIT. ____________________________________________  Time seen: Approximately 11:10 AM  I have reviewed the triage vital signs and the nursing notes.   HISTORY  Chief Complaint Weakness    HPI Emily Zamora is a 59 y.o. female history of chemotherapy, and fever to 101 on Wednesday. She had chemotherapy and is develop fevers, and loose stools over the last 3-4 days. She feels dehydrated and though she is able to eat and drink and not having any pain, she reports that she continues to have loose stools and she feels weak and tired. She occasionally has been reporting some chills. No rash, no cough, no chest pain. No nausea or vomiting. Denies abdominal pain, however is concerned that she continues to have fevers and weakness.  She is on active chemotherapy and has had recent radiation therapy as well.  Past Medical History  Diagnosis Date  . Cancer ([place])   . Primary cancer of endometrium ([place]) 05/24/2014  . Hypertension   . Knee pain, bilateral   . Arthritis   . Renal insufficiency     Patient Active Problem List   Diagnosis Date Noted  . Right knee pain 08/27/2014  . Primary cancer of endometrium ([place]) 05/24/2014  . Endometrial cancer ([place]) 05/24/2014    Past Surgical History  Procedure Laterality Date  . Gallbladder surgery  2014  . Abdominal hysterectomy      Current Outpatient Rx  Name  Route  Sig  Dispense  Refill  . aspirin 325 MG tablet   Oral   Take 325 mg by mouth daily.         Marland Kitchen atenolol (TENORMIN) 100 MG tablet   Oral   Take 1 tablet (100 mg total) by mouth daily.   30 tablet   5     May get 1 month refill then must be seen before an ...   . citalopram (CELEXA) 40 MG tablet   Oral   Take 1 tablet (40 mg  total) by mouth daily.   30 tablet   2   . [facility] SENNA PLUS 8.6-50 MG per tablet      TAKE 1 TABLET BY MOUTH TWICE A DAY   60 tablet   3   . cyclobenzaprine (FLEXERIL) 5 MG tablet      TAKE 1 TABLET BY MOUTH 3 TIMES DAILY AS NEEDED FOR MUSCLE SPASMS.   30 tablet   3   . dexamethasone (DECADRON) 4 MG tablet   Oral   Take 2 tablets (8 mg total) by mouth 2 (two) times daily. Start the day before Taxotere.   30 tablet   1   . famotidine (PEPCID) 20 MG tablet   Oral   Take 1 tablet (20 mg total) by mouth 2 (two) times daily.   60 tablet   5   . fentaNYL (DURAGESIC - DOSED MCG/HR) 75 MCG/HR   Transdermal   Place 1 patch (75 mcg total) onto the skin every 3 (three) days.   10 patch   0   . lidocaine-prilocaine (EMLA) cream   Topical   Apply 1 application topically as needed.   30 g   3   . loratadine (CLARITIN) 10 MG tablet   Oral   Take 1 tablet (10 mg total) by mouth daily.  30 tablet   5   . mirtazapine (REMERON) 15 MG tablet      TAKE 1 TABLET BY MOUTH AT BEDTIME   30 tablet   3   . Multiple Vitamin (MULTIVITAMIN) capsule   Oral   Take by mouth.         . ondansetron (ZOFRAN) 4 MG tablet   Oral   Take 1 tablet (4 mg total) by mouth every 6 (six) hours as needed.   30 tablet   3   . oxyCODONE (ROXICODONE) 15 MG immediate release tablet   Oral   Take 2 tablets (30 mg total) by mouth every 2 (two) hours as needed for pain.   90 tablet   0   . psyllium (METAMUCIL) 58.6 % powder   Oral   Take 1 packet by mouth 3 (three) times daily.         . sucralfate (CARAFATE) 1 G tablet   Oral   Take 1 tablet (1 g total) by mouth 4 (four) times daily -  with meals and at bedtime.   90 tablet   3   . warfarin (COUMADIN) 5 MG tablet   Oral   Take 1 tablet (5 mg total) by mouth daily at 6 PM.   30 tablet   1     Allergies Sulfa antibiotics  Family History  Problem Relation Age of Onset  . Breast cancer Sister 79  . Hypertension Sister   .  Diabetes Mother   . Hypertension Mother     Social History Social History  Substance Use Topics  . Smoking status: Never Smoker   . Smokeless tobacco: None  . Alcohol Use: No    Review of Systems Constitutional: See history of present illness Eyes: No visual changes. ENT: No sore throat. Cardiovascular: Denies chest pain. Respiratory: Denies shortness of breath. Gastrointestinal: No abdominal pain.  No nausea, no vomiting.  No constipation. Genitourinary: Negative for dysuria. Musculoskeletal: Negative for back pain. Skin: Negative for rash. Neurological: Negative for headaches, focal weakness or numbness.  10-point ROS otherwise negative.  ____________________________________________   PHYSICAL EXAM:  VITAL SIGNS: ED Triage Vitals  Enc Vitals Group     BP 11/21/14 1022 108/74 mmHg     Pulse Rate 11/21/14 1022 133     Resp 11/21/14 1022 20     Temp 11/21/14 1022 97.8 F (36.6 C)     Temp Source 11/21/14 1022 Oral     SpO2 11/21/14 1022 96 %     Weight 11/21/14 1022 175 lb (79.379 kg)     Height 11/21/14 1022 5\' 4"  (1.626 m)     Head Cir --      Peak Flow --      Pain Score --      Pain Loc --      Pain Edu? --      Excl. in [place]? --    Constitutional: Alert and oriented. Well appearing and in no acute distress. Eyes: Conjunctivae are normal. PERRL. EOMI. Head: Atraumatic. Nose: No congestion/rhinnorhea. Mouth/Throat: Mucous membranes are dry.  Oropharynx non-erythematous. Neck: No stridor.   Cardiovascular: Tachycardic rate, regular rhythm. Grossly normal heart sounds.  Good peripheral circulation. Respiratory: Normal respiratory effort.  No retractions. Lungs CTAB. Gastrointestinal: Soft and nontender. No distention. No abdominal bruits. No CVA tenderness. Musculoskeletal: No lower extremity tenderness nor edema.  No joint effusions. Neurologic:  Normal speech and language. No gross focal neurologic deficits are appreciated. Skin:  Skin is warm, dry  and  intact. No rash noted. Psychiatric: Mood and affect are normal. Speech and behavior are normal.  ____________________________________________   LABS (all labs ordered are listed, but only abnormal results are displayed)  Labs Reviewed  CULTURE, BLOOD (ROUTINE X 2)  CULTURE, BLOOD (ROUTINE X 2)  URINE CULTURE  BASIC METABOLIC PANEL  CBC  URINALYSIS COMPLETEWITH MICROSCOPIC ([facility] ONLY)  LIPASE, BLOOD  LACTIC ACID, PLASMA  LACTIC ACID, PLASMA   ____________________________________________  EKG  Reviewed and interpreted by me EKG time 10:30 AM Ventricular rate 130 Sinus tachycardia, nontoxic ST abnormality QTc 450 QRS 72 PR 136 No evidence of ST elevation or obvious acute ischemic abnormality ____________________________________________  RADIOLOGY  IMPRESSION: No acute cardiopulmonary disease. ____________________________________________   PROCEDURES  Procedure(s) performed: None  Critical Care performed: No  ____________________________________________   INITIAL IMPRESSION / ASSESSMENT AND PLAN / ED COURSE  Pertinent labs & imaging results that were available during my care of the patient were reviewed by me and considered in my medical decision making (see chart for details).  She presents for evaluation of fever. She is notably tachycardic and her only focal complaint appears to be that of loose stools and diarrhea. She does report a fever at home which I believe is reliable, she is neutropenic, her chest x-ray is clear. The patient unable to produce urine at this time so we'll straight catheterize her, but after discussion with oncology we will go ahead and empirically treat with cefepime for coverage for neutropenic fever. We'll admit the patient to hospitalist service pending further cultures, stool evaluation, and urinalysis which were is being conducted at this time. The patient overall appears to be tachycardic, and we cannot rule out bacteremia at this  time given this high risk in a neutropenic patient with fever.  Lactate is reassuring, no evidence or signs or symptoms of severe sepsis however I do suspect an element of sepsis is likely present.  Hyponatremia is noted likely hypovolemic in nature and I have initiated a single liter of fluid.  Overall the patient's condition appears to be improving she is resting comfortably after the initial liter. Heart rate slowly improving, antibiotics given. Admitting to the hospitalist service and I discussed with oncology will see the patient in consultation and are aware of her admission. ____________________________________________   FINAL CLINICAL IMPRESSION(S) / ED DIAGNOSES  Final diagnoses:  None      Delman Kitten, MD 11/21/14 1547

## 2014-11-21 NOTE — Progress Notes (Signed)
Patient arrived on unit late in shift VSS No c/o pain C-diff came back negative  Son at bedside Patient in IV fluids and IV Emily Zamora

## 2014-11-21 NOTE — Telephone Encounter (Signed)
Called to report she had chemo last Monday and has had fever of 101 since Wednesday, difficulty swallowing from XRT, diarrhea 4 - 5 times a day, and has not eaten for 2 days.  States she called on call last night and was told to keep fluids going and to call this morning if not better. I discussed this with L Herring, AGNP-C who said she needs to go to ER and she was told this same thing last night. I called Santiago Glad back and told her that she needs to go to ER and she said "really?" I explained reasoning for ER and and she should go asap.  She stated she would take her

## 2014-11-21 NOTE — Progress Notes (Signed)
ANTICOAGULATION CONSULT NOTE - Initial Consult  Pharmacy Consult for Coumadin Indication: History of DVT  Allergies  Allergen Reactions  . Sulfa Antibiotics Anaphylaxis    Patient Measurements: Height: 5\' 4"  (162.6 cm) Weight: 158 lb 11.2 oz (71.986 kg) IBW/kg (Calculated) : 54.7 Heparin Dosing Weight: na  Vital Signs: Temp: 98.5 F (36.9 C) (11/18 1721) Temp Source: Oral (11/18 1721) BP: 119/96 mmHg (11/18 1721) Pulse Rate: 126 (11/18 1721)  Labs:  Recent Labs  11/21/14 1053 11/21/14 1643  HGB 12.8  --   HCT 39.0  --   PLT 52*  --   LABPROT  --  25.3*  INR  --  2.33  CREATININE 1.18*  --     Estimated Creatinine Clearance: 48.7 mL/min (by C-G formula based on Cr of 1.18).   Medical History: Past Medical History  Diagnosis Date  . Cancer ([place])   . Primary cancer of endometrium ([place]) 05/24/2014  . Hypertension   . Knee pain, bilateral   . Arthritis   . Renal insufficiency     Medications:  Prescriptions prior to admission  Medication Sig Dispense Refill Last Dose  . atenolol (TENORMIN) 100 MG tablet Take 1 tablet (100 mg total) by mouth daily. 30 tablet 5 Past Week at Unknown time  . citalopram (CELEXA) 40 MG tablet Take 1 tablet (40 mg total) by mouth daily. 30 tablet 2 Past Week at Unknown time  . cyclobenzaprine (FLEXERIL) 5 MG tablet Take 5 mg by mouth 3 (three) times daily as needed for muscle spasms.   Past Week at Unknown time  . dexamethasone (DECADRON) 4 MG tablet Take 8 mg by mouth 2 (two) times daily. Pt is to take the day before chemo(Taxotere).   Past Week at Unknown time  . famotidine (PEPCID) 20 MG tablet Take 1 tablet (20 mg total) by mouth 2 (two) times daily. 60 tablet 5 Past Week at Unknown time  . fentaNYL (DURAGESIC - DOSED MCG/HR) 75 MCG/HR Place 1 patch (75 mcg total) onto the skin every 3 (three) days. 10 patch 0 11/18/2014 at unknown  . lidocaine-prilocaine (EMLA) cream Apply 1 application topically as needed. (Patient taking  differently: Apply 1 application topically as needed (prior to accessing port). ) 30 g 3 Past Week at Unknown time  . loratadine (CLARITIN) 10 MG tablet Take 1 tablet (10 mg total) by mouth daily. 30 tablet 5 Past Week at Unknown time  . mirtazapine (REMERON) 15 MG tablet Take 15 mg by mouth at bedtime.   Past Week at Unknown time  . Multiple Vitamin (MULTIVITAMIN WITH MINERALS) TABS tablet Take 1 tablet by mouth daily.   Past Week at Unknown time  . ondansetron (ZOFRAN) 4 MG tablet Take 1 tablet (4 mg total) by mouth every 6 (six) hours as needed. (Patient taking differently: Take 4 mg by mouth every 6 (six) hours as needed for nausea or vomiting. ) 30 tablet 3 Past Week at Unknown time  . oxyCODONE (ROXICODONE) 15 MG immediate release tablet Take 2 tablets (30 mg total) by mouth every 2 (two) hours as needed for pain. 90 tablet 0 Past Week at Unknown time  . psyllium (METAMUCIL) 58.6 % powder Take 1 packet by mouth 3 (three) times daily as needed (for constipation).    Past Week at Unknown time  . senna-docusate (SENOKOT-S) 8.6-50 MG tablet Take 1 tablet by mouth 2 (two) times daily.   Past Week at Unknown time  . sucralfate (CARAFATE) 1 G tablet Take 1 tablet (  1 g total) by mouth 4 (four) times daily -  with meals and at bedtime. 90 tablet 3 Past Week at Unknown time  . warfarin (COUMADIN) 5 MG tablet Take 1 tablet (5 mg total) by mouth daily at 6 PM. 30 tablet 1 Past Week at Unknown time   Scheduled:  . aspirin  325 mg Oral Daily  . atenolol  100 mg Oral Daily  . cefTAZidime (FORTAZ)  IV  2 g Intravenous 3 times per day  . citalopram  40 mg Oral Daily  . dexamethasone (DECADRON) IVPB [facility]  8 mg Intravenous Once  . famotidine  20 mg Oral BID  . fentaNYL  75 mcg Transdermal Q72H  . loratadine  10 mg Oral Daily  . mirtazapine  15 mg Oral QHS  . multivitamin with minerals  1 tablet Oral Daily  . senna-docusate  1 tablet Oral BID  . sucralfate  1 g Oral TID WC & HS  . warfarin  5 mg Oral q1800     Assessment: Patient is a 59yo female admitted for neutropenic fever. Patient was taking Coumadin 5mg  daily prior to admission. INR on admission was 2.33.  Goal of Therapy:  INR 2-3 Monitor platelets by anticoagulation protocol: Yes   Plan:  Will continue with home dose of Coumadin 5mg  daily and check daily INRs. Will adjust dose based on INR results.  Paulina Fusi, PharmD, BCPS 11/21/2014 6:14 PM

## 2014-11-21 NOTE — ED Notes (Signed)
Pt is currently being treated for stomach cancer, pt received chemo on Monday, pt reports generalized weakness and dehydration

## 2014-11-22 ENCOUNTER — Other Ambulatory Visit: Payer: Self-pay | Admitting: Oncology

## 2014-11-22 DIAGNOSIS — R197 Diarrhea, unspecified: Secondary | ICD-10-CM

## 2014-11-22 DIAGNOSIS — R630 Anorexia: Secondary | ICD-10-CM

## 2014-11-22 DIAGNOSIS — M129 Arthropathy, unspecified: Secondary | ICD-10-CM

## 2014-11-22 DIAGNOSIS — R509 Fever, unspecified: Secondary | ICD-10-CM

## 2014-11-22 DIAGNOSIS — M25561 Pain in right knee: Secondary | ICD-10-CM

## 2014-11-22 DIAGNOSIS — C541 Malignant neoplasm of endometrium: Secondary | ICD-10-CM

## 2014-11-22 DIAGNOSIS — M25562 Pain in left knee: Secondary | ICD-10-CM

## 2014-11-22 DIAGNOSIS — N289 Disorder of kidney and ureter, unspecified: Secondary | ICD-10-CM

## 2014-11-22 DIAGNOSIS — Z803 Family history of malignant neoplasm of breast: Secondary | ICD-10-CM

## 2014-11-22 DIAGNOSIS — I10 Essential (primary) hypertension: Secondary | ICD-10-CM

## 2014-11-22 LAB — PROTIME-INR
INR: 2.29
PROTHROMBIN TIME: 25 s — AB (ref 11.4–15.0)

## 2014-11-22 LAB — BASIC METABOLIC PANEL
ANION GAP: 8 (ref 5–15)
BUN: 28 mg/dL — ABNORMAL HIGH (ref 6–20)
CALCIUM: 7.7 mg/dL — AB (ref 8.9–10.3)
CO2: 20 mmol/L — AB (ref 22–32)
Chloride: 106 mmol/L (ref 101–111)
Creatinine, Ser: 0.95 mg/dL (ref 0.44–1.00)
GLUCOSE: 143 mg/dL — AB (ref 65–99)
POTASSIUM: 5.6 mmol/L — AB (ref 3.5–5.1)
Sodium: 134 mmol/L — ABNORMAL LOW (ref 135–145)

## 2014-11-22 LAB — CBC
HEMATOCRIT: 38.1 % (ref 35.0–47.0)
HEMOGLOBIN: 12.6 g/dL (ref 12.0–16.0)
MCH: 28.7 pg (ref 26.0–34.0)
MCHC: 33 g/dL (ref 32.0–36.0)
MCV: 86.9 fL (ref 80.0–100.0)
Platelets: 40 10*3/uL — ABNORMAL LOW (ref 150–440)
RBC: 4.38 MIL/uL (ref 3.80–5.20)
RDW: 16.2 % — ABNORMAL HIGH (ref 11.5–14.5)
WBC: 0.6 10*3/uL — CL (ref 3.6–11.0)

## 2014-11-22 MED ORDER — TBO-FILGRASTIM 480 MCG/0.8ML ~~LOC~~ SOSY
480.0000 ug | PREFILLED_SYRINGE | Freq: Every day | SUBCUTANEOUS | Status: DC
  Administered 2014-11-22 – 2014-11-29 (×8): 480 ug via SUBCUTANEOUS
  Filled 2014-11-22 (×13): qty 0.8

## 2014-11-22 MED ORDER — SODIUM POLYSTYRENE SULFONATE 15 GM/60ML PO SUSP
30.0000 g | Freq: Once | ORAL | Status: AC
  Administered 2014-11-22: 14:00:00 30 g via ORAL
  Filled 2014-11-22: qty 120

## 2014-11-22 NOTE — Plan of Care (Addendum)
Problem: Education: Goal: Knowledge of [facility] Education information/materials will improve Outcome: Progressing Pt receiving IVF, IV ABX infusing. No c/o pain. Patient started on granix for low WBCs. Stool culture sent. Pt unsteady on feet. Calls out for needs. Kayexalate given 1x for elevated potassium. Pt had a BM

## 2014-11-22 NOTE — Consult Note (Signed)
[facility] CONSULT NOTE  Patient Care Team: Ashok Norris, MD as PCP - General (Family Medicine)  CHIEF COMPLAINTS/PURPOSE OF CONSULTATION: Neutropenic fever/sepsis  HISTORY OF PRESENTING ILLNESS:  Emily Zamora 59 y.o.  female history of recurrent/metastatic stage IV endometrial cancer currently on cisplatin and Taxotere [follows up with Dr. Oliva Bustard recently started is currently admitted to the hospital gel as weakness fevers.  Patient had chemotherapy cisplatin Taxotere cycle #1 on 11/14; patient states-she did not receive Neupogen or Neulasta. Patient noted to have 2-3 loose stools a day; poor appetite associated with nausea no vomiting. She also had fevers up to 101 associated with chills. Denies any worsening cough or shortness of breath or chest pain. Patient currently admitted to the hospital for further evaluation.  Patient's urine culture  are positive for gram-negative rods; identification pending. Blood cultures pending.; Patient on ceftazidime.   ROS: A complete 10 point review of system is done which is negative except mentioned above in history of present illness  MEDICAL HISTORY:  Past Medical History  Diagnosis Date  . Cancer ([place])   . Primary cancer of endometrium ([place]) 05/24/2014  . Hypertension   . Knee pain, bilateral   . Arthritis   . Renal insufficiency     SURGICAL HISTORY: Past Surgical History  Procedure Laterality Date  . Gallbladder surgery  2014  . Abdominal hysterectomy      SOCIAL HISTORY: Social History   Social History  . Marital Status: Single    Spouse Name: N/A  . Number of Children: N/A  . Years of Education: N/A   Occupational History  . Not on file.   Social History Main Topics  . Smoking status: Never Smoker   . Smokeless tobacco: Not on file  . Alcohol Use: No  . Drug Use: No  . Sexual Activity: Not on file   Other Topics Concern  . Not on file   Social History Narrative    FAMILY HISTORY: Family  History  Problem Relation Age of Onset  . Breast cancer Sister 22  . Hypertension Sister   . Diabetes Mother   . Hypertension Mother     ALLERGIES:  is allergic to sulfa antibiotics.  MEDICATIONS:  Current Facility-Administered Medications  Medication Dose Route Frequency Provider Last Rate Last Dose  . 0.9 %  sodium chloride infusion   Intravenous Continuous Vaughan Basta, MD 75 mL/hr at 11/22/14 0536    . aspirin tablet 325 mg  325 mg Oral Daily Vaughan Basta, MD   325 mg at 11/21/14 1728  . atenolol (TENORMIN) tablet 100 mg  100 mg Oral Daily Vaughan Basta, MD   100 mg at 11/21/14 1729  . cefTAZidime (FORTAZ) 2 g in dextrose 5 % 50 mL IVPB  2 g Intravenous 3 times per day Delman Kitten, MD 100 mL/hr at 11/22/14 0536 2 g at 11/22/14 0536  . citalopram (CELEXA) tablet 40 mg  40 mg Oral Daily Vaughan Basta, MD   40 mg at 11/21/14 1728  . cyclobenzaprine (FLEXERIL) tablet 5 mg  5 mg Oral TID PRN Vaughan Basta, MD      . famotidine (PEPCID) tablet 20 mg  20 mg Oral BID Vaughan Basta, MD   20 mg at 11/22/14 0821  . fentaNYL (DURAGESIC - dosed mcg/hr) patch 75 mcg  75 mcg Transdermal Q72H Vaughan Basta, MD   75 mcg at 11/21/14 1728  . loratadine (CLARITIN) tablet 10 mg  10 mg Oral Daily Vaughan Basta, MD  10 mg at 11/21/14 1728  . mirtazapine (REMERON) tablet 15 mg  15 mg Oral QHS Vaughan Basta, MD   15 mg at 11/21/14 2044  . multivitamin with minerals tablet 1 tablet  1 tablet Oral Daily Vaughan Basta, MD   1 tablet at 11/22/14 [phone]  . ondansetron (ZOFRAN) tablet 4 mg  4 mg Oral Q8H PRN Vaughan Basta, MD      . oxyCODONE (Oxy IR/ROXICODONE) immediate release tablet 30 mg  30 mg Oral Q6H PRN Vaughan Basta, MD      . senna-docusate (Senokot-S) tablet 1 tablet  1 tablet Oral BID Vaughan Basta, MD   1 tablet at 11/22/14 0821  . sucralfate (CARAFATE) tablet 1 g  1 g Oral TID WC & HS Vaughan Basta, MD   1 g at 11/21/14 2045  . warfarin (COUMADIN) tablet 5 mg  5 mg Oral q1800 Vaughan Basta, MD   5 mg at 11/21/14 1859   Facility-Administered Medications Ordered in Other Encounters  Medication Dose Route Frequency Provider Last Rate Last Dose  . sodium chloride 0.9 % injection 10 mL  10 mL Intracatheter PRN Forest Gleason, MD   10 mL at 11/10/14 0837      .  PHYSICAL EXAMINATION:   Filed Vitals:   11/22/14 0934  BP:   Pulse: 91  Temp:   Resp:    Filed Weights   11/21/14 1022 11/21/14 1721  Weight: 175 lb (79.379 kg) 158 lb 11.2 oz (71.986 kg)    GENERAL: Well-nourished well-developed; Alert, no distress and comfortable.   She is accompanied by her son by the bedside. She appears weak overall.  EYES: no pallor or icterus OROPHARYNX: no thrush or ulceration; good dentition  NECK: supple, no masses felt LYMPH:  no palpable lymphadenopathy in the cervical, axillary or inguinal regions LUNGS: clear to auscultation and  No wheeze or crackles HEART/CVS: regular rate & rhythm and no murmurs; No lower extremity edema ABDOMEN: abdomen soft, non-tender and normal bowel sounds Musculoskeletal:no cyanosis of digits and no clubbing  PSYCH: alert & oriented x 3 with fluent speech NEURO: no focal motor/sensory deficits SKIN:  no rashes or significant lesions  LABORATORY DATA:  I have reviewed the data as listed Lab Results  Component Value Date   WBC 0.6* 11/22/2014   HGB 12.6 11/22/2014   HCT 38.1 11/22/2014   MCV 86.9 11/22/2014   PLT 40* 11/22/2014    Recent Labs  10/12/14 1742 10/20/14 0819 11/10/14 0828 11/21/14 1053 11/22/14 0335  NA 134* 136 133* 128* 134*  K 3.8 4.1 4.2 5.1 5.6*  CL 99* 100* 100* 97* 106  CO2 27 27 24  20* 20*  GLUCOSE 93 102* 97 139* 143*  BUN 25* 33* 33* 33* 28*  CREATININE 1.27* 1.37* 1.28* 1.18* 0.95  CALCIUM 9.1 8.9 9.2 7.4* 7.7*  GFRNONAA 45* 41* 44* 49* >60  GFRAA 52* 47* 51* 56* >60  PROT 7.6 7.0 6.5  --   --    ALBUMIN 4.0 3.8 3.6  --   --   AST 31 21 37  --   --   ALT 21 16 24   --   --   ALKPHOS 89 94 162*  --   --   BILITOT 1.7* 0.7 0.7  --   --     RADIOGRAPHIC STUDIES: I have personally reviewed the radiological images as listed and agreed with the findings in the report. Dg Chest Port 1 View  11/21/2014  CLINICAL DATA:  Patient with fever. On chemotherapy for uterine carcinoma. EXAM: PORTABLE CHEST 1 VIEW COMPARISON:  2/20 6/6 FINDINGS: Heart, mediastinum hila are unremarkable. Lungs are clear. No pleural effusion or pneumothorax. Bony thorax is grossly intact. Right anterior chest wall Port-A-Cath is stable and well positioned. IMPRESSION: No acute cardiopulmonary disease. Electronically Signed   By: Lajean Manes M.D.   On: 11/21/2014 11:41    ASSESSMENT & PLAN:   59 year old female patient with a history of recurrent /metastatic uterine cancer  currently on chemotherapy with cisplatin and docetaxel is currently admitted to the hospital for neutropenic fever   # Neutropenic fever- source likely UTI- urine culture positive for gram-negative rods. Blood cultures pending. Agree with ceftazidime  # Uterine cancer- currently on cisplatin and docetaxel cycle 1 #6.   # Diarrhea- likely from Taxotere.; C. difficile negative  Recommendations:  # Continue IV antibiotics; antibiotics to be narrowed down based upon final urine culture/blood cultures  # I recommend adding Neupogen daily- given the significant neutropenia.  # Diarrhea-symptomatic management.  Thank you Dr. Marthann Schiller for all of me to participate in the care of your pleasant patient. Please do not history to contact me if any questions or concerns.  The above plan was discussed with Dr. Marthann Schiller.   Plan was discussed with the patient /son. Chest x-ray independently reviewed.      Cammie Sickle, MD 11/22/2014 11:59 AM

## 2014-11-22 NOTE — Progress Notes (Signed)
ANTICOAGULATION CONSULT NOTE - Follow UP  Pharmacy Consult for Coumadin Indication: History of DVT  Allergies  Allergen Reactions  . Sulfa Antibiotics Anaphylaxis    Patient Measurements: Height: 5\' 4"  (162.6 cm) Weight: 158 lb 11.2 oz (71.986 kg) IBW/kg (Calculated) : 54.7 Heparin Dosing Weight: na  Vital Signs: Temp: 97.6 F (36.4 C) (11/19 0443) Temp Source: Oral (11/19 0443) BP: 118/93 mmHg (11/19 0443) Pulse Rate: 99 (11/19 0443)  Labs:  Recent Labs  11/21/14 1053 11/21/14 1643 11/22/14 0335  HGB 12.8  --  12.6  HCT 39.0  --  38.1  PLT 52*  --  40*  LABPROT  --  25.3* 25.0*  INR  --  2.33 2.29  CREATININE 1.18*  --  0.95    Estimated Creatinine Clearance: 60.5 mL/min (by C-G formula based on Cr of 0.95).   Medical History: Past Medical History  Diagnosis Date  . Cancer ([place])   . Primary cancer of endometrium ([place]) 05/24/2014  . Hypertension   . Knee pain, bilateral   . Arthritis   . Renal insufficiency     Medications:  Prescriptions prior to admission  Medication Sig Dispense Refill Last Dose  . atenolol (TENORMIN) 100 MG tablet Take 1 tablet (100 mg total) by mouth daily. 30 tablet 5 Past Week at Unknown time  . citalopram (CELEXA) 40 MG tablet Take 1 tablet (40 mg total) by mouth daily. 30 tablet 2 Past Week at Unknown time  . cyclobenzaprine (FLEXERIL) 5 MG tablet Take 5 mg by mouth 3 (three) times daily as needed for muscle spasms.   Past Week at Unknown time  . dexamethasone (DECADRON) 4 MG tablet Take 8 mg by mouth 2 (two) times daily. Pt is to take the day before chemo(Taxotere).   Past Week at Unknown time  . famotidine (PEPCID) 20 MG tablet Take 1 tablet (20 mg total) by mouth 2 (two) times daily. 60 tablet 5 Past Week at Unknown time  . fentaNYL (DURAGESIC - DOSED MCG/HR) 75 MCG/HR Place 1 patch (75 mcg total) onto the skin every 3 (three) days. 10 patch 0 11/18/2014 at unknown  . lidocaine-prilocaine (EMLA) cream Apply 1 application  topically as needed. (Patient taking differently: Apply 1 application topically as needed (prior to accessing port). ) 30 g 3 Past Week at Unknown time  . loratadine (CLARITIN) 10 MG tablet Take 1 tablet (10 mg total) by mouth daily. 30 tablet 5 Past Week at Unknown time  . mirtazapine (REMERON) 15 MG tablet Take 15 mg by mouth at bedtime.   Past Week at Unknown time  . Multiple Vitamin (MULTIVITAMIN WITH MINERALS) TABS tablet Take 1 tablet by mouth daily.   Past Week at Unknown time  . ondansetron (ZOFRAN) 4 MG tablet Take 1 tablet (4 mg total) by mouth every 6 (six) hours as needed. (Patient taking differently: Take 4 mg by mouth every 6 (six) hours as needed for nausea or vomiting. ) 30 tablet 3 Past Week at Unknown time  . oxyCODONE (ROXICODONE) 15 MG immediate release tablet Take 2 tablets (30 mg total) by mouth every 2 (two) hours as needed for pain. 90 tablet 0 Past Week at Unknown time  . psyllium (METAMUCIL) 58.6 % powder Take 1 packet by mouth 3 (three) times daily as needed (for constipation).    Past Week at Unknown time  . senna-docusate (SENOKOT-S) 8.6-50 MG tablet Take 1 tablet by mouth 2 (two) times daily.   Past Week at Unknown time  .  sucralfate (CARAFATE) 1 G tablet Take 1 tablet (1 g total) by mouth 4 (four) times daily -  with meals and at bedtime. 90 tablet 3 Past Week at Unknown time  . warfarin (COUMADIN) 5 MG tablet Take 1 tablet (5 mg total) by mouth daily at 6 PM. 30 tablet 1 Past Week at Unknown time   Scheduled:  . aspirin  325 mg Oral Daily  . atenolol  100 mg Oral Daily  . cefTAZidime (FORTAZ)  IV  2 g Intravenous 3 times per day  . citalopram  40 mg Oral Daily  . famotidine  20 mg Oral BID  . fentaNYL  75 mcg Transdermal Q72H  . loratadine  10 mg Oral Daily  . mirtazapine  15 mg Oral QHS  . multivitamin with minerals  1 tablet Oral Daily  . senna-docusate  1 tablet Oral BID  . sucralfate  1 g Oral TID WC & HS  . warfarin  5 mg Oral q1800     Assessment: Patient is a 59yo female admitted for neutropenic fever. Patient was taking Coumadin 5mg  daily prior to admission. INR on admission was 2.33. Today INR 2.29 and PTT 25.  Patients platelets at 40 today, down from 52.   Goal of Therapy:  INR 2-3 Monitor platelets by anticoagulation protocol: Yes   Plan:  Will continue with home dose of Coumadin 5mg  daily and check daily INRs. Will adjust dose based on INR results. Spoke with patients nurse about patients platelet level being low (40) and patient being on warfarin and that patient should be monitored closely for signs and symptoms of bleeding. Nurse was aware and understood.  Pharmacy will continue to monitor labs.   Nancy Fetter, PharmD Pharmacy Resident

## 2014-11-22 NOTE — Progress Notes (Signed)
[facility] at [place] NAME: Emily Zamora    MR#:  [id number]  DATE OF BIRTH:  07/24/1955  SUBJECTIVE:  CHIEF COMPLAINT:   Chief Complaint  Patient presents with  . Weakness    Still weak- but it is chronic for her as per son for last few weeks to months.   No more fever.  REVIEW OF SYSTEMS:   CONSTITUTIONAL: Positive for fever, fatigue or weakness.  EYES: No blurred or double vision.  EARS, NOSE, AND THROAT: No tinnitus or ear pain.  RESPIRATORY: No cough, shortness of breath, wheezing or hemoptysis.  CARDIOVASCULAR: No chest pain, orthopnea, edema.  GASTROINTESTINAL: No nausea, vomiting, positive for diarrhea but no abdominal pain.  GENITOURINARY: No dysuria, hematuria.  ENDOCRINE: No polyuria, nocturia,  HEMATOLOGY: No anemia, easy bruising or bleeding SKIN: No rash or lesion. MUSCULOSKELETAL: No joint pain or arthritis.  NEUROLOGIC: No tingling, numbness, weakness.  PSYCHIATRY: No anxiety or depression.  ROS  DRUG ALLERGIES:   Allergies  Allergen Reactions  . Sulfa Antibiotics Anaphylaxis    VITALS:  Blood pressure 120/84, pulse 103, temperature 99.1 F (37.3 C), temperature source Oral, resp. rate 19, height 5\' 4"  (1.626 m), weight 71.986 kg (158 lb 11.2 oz), SpO2 99 %.  PHYSICAL EXAMINATION:   GENERAL:59 y.o.-year-old patient lying in the bed with no acute distress.  EYES: Pupils equal, round, reactive to light and accommodation. No scleral icterus. Extraocular muscles intact.  HEENT: Head atraumatic, normocephalic. Oropharynx and nasopharynx clear.  NECK: Supple, no jugular venous distention. No thyroid enlargement, no tenderness.  LUNGS: Normal breath sounds bilaterally, no wheezing, rales,rhonchi or crepitation. No use of accessory muscles of respiration.  CARDIOVASCULAR: S1, S2 fast. No murmurs, rubs, or gallops.  ABDOMEN: Soft, nontender, nondistended. Bowel sounds present. No organomegaly  or mass.  EXTREMITIES: No pedal edema, cyanosis, or clubbing.  NEUROLOGIC: Cranial nerves II through XII are intact. Muscle strength 5/5 in upper extremities 4/5 lower. Sensation intact. Gait not checked.  PSYCHIATRIC: The patient is alert and oriented x 3.  SKIN: No obvious rash, lesion, or ulcer.   Physical Exam LABORATORY PANEL:   CBC  Recent Labs Lab 11/22/14 0335  WBC 0.6*  HGB 12.6  HCT 38.1  PLT 40*   ------------------------------------------------------------------------------------------------------------------  Chemistries   Recent Labs Lab 11/22/14 0335  NA 134*  K 5.6*  CL 106  CO2 20*  GLUCOSE 143*  BUN 28*  CREATININE 0.95  CALCIUM 7.7*   ------------------------------------------------------------------------------------------------------------------  Cardiac Enzymes No results for input(s): TROPONINI in the last 168 hours. ------------------------------------------------------------------------------------------------------------------  RADIOLOGY:  Dg Chest Port 1 View  11/21/2014  CLINICAL DATA:  Patient with fever. On chemotherapy for uterine carcinoma. EXAM: PORTABLE CHEST 1 VIEW COMPARISON:  2/20 6/6 FINDINGS: Heart, mediastinum hila are unremarkable. Lungs are clear. No pleural effusion or pneumothorax. Bony thorax is grossly intact. Right anterior chest wall Port-A-Cath is stable and well positioned. IMPRESSION: No acute cardiopulmonary disease. Electronically Signed   By: Lajean Manes M.D.   On: 11/21/2014 11:41    ASSESSMENT AND PLAN:   Principal Problem:   Neutropenic fever ([place]) Active Problems:   Diarrhea  * Neutropenic fever Blood culture urine culture sent, collect stool for culture and C. difficile whenever she has neck bowel movement. Tressie Ellis for now.  UA positive, awaited final report on ur cx. appreciated Oncology consult.   Given neupogen for neutropenia.  * Sinus tachycardia This may be as a result of fever and  mild dehydration  due to diarrhea We'll give IV fluid and continue monitoring. She is on atenolol at home will continue that.   Now HR normal.  * History of DVT She is on Coumadin will continue that. Pharmacy to help manage the dose.  * Endometrial cancer with metastasis She is on chemotherapy , oncology for further management.  * Hypertension We'll continue atenolol.  * Hyperkalemia   Oral kayexalate.  All the records are reviewed and case discussed with Care Management/Social Workerr. Management plans discussed with the patient, family and they are in agreement.  CODE STATUS: full  TOTAL TIME TAKING CARE OF THIS PATIENT: 35 minutes.     POSSIBLE D/C IN 1-2 DAYS, DEPENDING ON CLINICAL CONDITION.   Vaughan Basta M.D on 11/22/2014   Between 7am to 6pm - Pager - [phone]  After 6pm go to [email or web address] - password [email or web address] [facility]  Office  [phone]  CC: Primary care physician; Ashok Norris, MD  Note: This dictation was prepared with Dragon dictation along with smaller phrase technology. Any transcriptional errors that result from this process are unintentional.

## 2014-11-23 LAB — BASIC METABOLIC PANEL
Anion gap: 6 (ref 5–15)
BUN: 28 mg/dL — AB (ref 6–20)
CHLORIDE: 107 mmol/L (ref 101–111)
CO2: 22 mmol/L (ref 22–32)
Calcium: 7 mg/dL — ABNORMAL LOW (ref 8.9–10.3)
Creatinine, Ser: 0.84 mg/dL (ref 0.44–1.00)
GFR calc Af Amer: >60 mL/min (ref >60)
GFR calc non Af Amer: >60 mL/min (ref >60)
Glucose, Bld: 79 mg/dL (ref 65–99)
POTASSIUM: 3.8 mmol/L (ref 3.5–5.1)
SODIUM: 135 mmol/L (ref 135–145)

## 2014-11-23 LAB — CBC
HEMATOCRIT: 34.9 % — AB (ref 35.0–47.0)
HEMOGLOBIN: 11.8 g/dL — AB (ref 12.0–16.0)
MCH: 29.1 pg (ref 26.0–34.0)
MCHC: 33.7 g/dL (ref 32.0–36.0)
MCV: 86.5 fL (ref 80.0–100.0)
Platelets: 32 10*3/uL — ABNORMAL LOW (ref 150–440)
RBC: 4.04 MIL/uL (ref 3.80–5.20)
RDW: 16.2 % — AB (ref 11.5–14.5)
WBC: 0.5 10*3/uL — AB (ref 3.6–11.0)

## 2014-11-23 LAB — URINE CULTURE

## 2014-11-23 LAB — PROTIME-INR
INR: 2.78
Prothrombin Time: 28.9 seconds — ABNORMAL HIGH (ref 11.4–15.0)

## 2014-11-23 LAB — ABO/RH: ABO/RH(D): O POS

## 2014-11-23 MED ORDER — HEPARIN SOD (PORK) LOCK FLUSH 100 UNIT/ML IV SOLN
250.0000 [IU] | INTRAVENOUS | Status: AC | PRN
  Administered 2014-11-29: 08:00:00 250 [IU]
  Filled 2014-11-23: qty 5

## 2014-11-23 MED ORDER — SODIUM CHLORIDE 0.9 % IJ SOLN: 10.0000 mL | INTRAMUSCULAR | Status: DC | PRN

## 2014-11-23 MED ORDER — HEPARIN SOD (PORK) LOCK FLUSH 100 UNIT/ML IV SOLN: 500.0000 [IU] | Freq: Every day | INTRAVENOUS | Status: DC | PRN

## 2014-11-23 MED ORDER — CEPHALEXIN 500 MG PO CAPS
500.0000 mg | ORAL_CAPSULE | Freq: Two times a day (BID) | ORAL | Status: DC
  Administered 2014-11-23 – 2014-11-25 (×4): 500 mg via ORAL
  Filled 2014-11-23 (×6): qty 1

## 2014-11-23 MED ORDER — ACETAMINOPHEN 325 MG PO TABS
650.0000 mg | ORAL_TABLET | Freq: Once | ORAL | Status: AC
  Administered 2014-11-23: 13:00:00 650 mg via ORAL
  Filled 2014-11-23: qty 2

## 2014-11-23 MED ORDER — MAGIC MOUTHWASH
10.0000 mL | Freq: Four times a day (QID) | ORAL | Status: DC | PRN
  Administered 2014-11-24 – 2014-11-27 (×5): 10 mL via ORAL
  Filled 2014-11-23 (×7): qty 10

## 2014-11-23 MED ORDER — SODIUM CHLORIDE 0.9 % IJ SOLN: 3.0000 mL | INTRAMUSCULAR | Status: DC | PRN

## 2014-11-23 MED ORDER — WARFARIN - PHARMACIST DOSING INPATIENT
Freq: Every day | Status: DC
  Administered 2014-11-23 – 2014-11-28 (×5)

## 2014-11-23 MED ORDER — WARFARIN SODIUM 2 MG PO TABS
3.0000 mg | ORAL_TABLET | Freq: Every day | ORAL | Status: DC
  Administered 2014-11-23: 17:00:00 3 mg via ORAL
  Filled 2014-11-23: qty 1

## 2014-11-23 MED ORDER — SODIUM CHLORIDE 0.9 % IV SOLN
250.0000 mL | Freq: Once | INTRAVENOUS | Status: AC
  Administered 2014-11-23: 13:00:00 250 mL via INTRAVENOUS

## 2014-11-23 MED ORDER — DIPHENHYDRAMINE HCL 25 MG PO CAPS
25.0000 mg | ORAL_CAPSULE | Freq: Once | ORAL | Status: AC
  Administered 2014-11-23: 13:00:00 25 mg via ORAL
  Filled 2014-11-23: qty 1

## 2014-11-23 NOTE — Plan of Care (Signed)
Problem: Education: Goal: Knowledge of [facility] Education information/materials will improve Outcome: Progressing Patient education provided concerning high fall risk with bed alarm activated. Pt verbalized understanding to call for assistance when needed. Denies pain. Pt is unsteady on feet and this shift for multiple BM's, Stool cx sent 11/22/2014 and pt is cdiff negative.

## 2014-11-23 NOTE — Progress Notes (Signed)
[facility] at [place] NAME: Emily Zamora    MR#:  [id number]  DATE OF BIRTH:  01-Feb-1955  SUBJECTIVE:  CHIEF COMPLAINT:   Chief Complaint  Patient presents with  . Weakness    Still weak- but it is chronic for her as per son for last few weeks to months.   No more fever.   Platelets dropped today, no active bleed.  REVIEW OF SYSTEMS:   CONSTITUTIONAL: negative  for fever in last 36 hrs, positive for fatigue or weakness.  EYES: No blurred or double vision.  EARS, NOSE, AND THROAT: No tinnitus or ear pain.  RESPIRATORY: No cough, shortness of breath, wheezing or hemoptysis.  CARDIOVASCULAR: No chest pain, orthopnea, edema.  GASTROINTESTINAL: No nausea, vomiting, positive for diarrhea but no abdominal pain.  GENITOURINARY: No dysuria, hematuria.  ENDOCRINE: No polyuria, nocturia,  HEMATOLOGY: No anemia, easy bruising or bleeding SKIN: No rash or lesion. MUSCULOSKELETAL: No joint pain or arthritis.  NEUROLOGIC: No tingling, numbness, have generalized weakness due to underlying cancer.  PSYCHIATRY: No anxiety or depression.  ROS  DRUG ALLERGIES:   Allergies  Allergen Reactions  . Sulfa Antibiotics Anaphylaxis    VITALS:  Blood pressure 125/88, pulse 105, temperature 97.8 F (36.6 C), temperature source Oral, resp. rate 18, height 5\' 4"  (1.626 m), weight 71.986 kg (158 lb 11.2 oz), SpO2 99 %.  PHYSICAL EXAMINATION:   GENERAL:59 y.o.-year-old patient lying in the bed with no acute distress.  EYES: Pupils equal, round, reactive to light and accommodation. No scleral icterus. Extraocular muscles intact.  HEENT: Head atraumatic, normocephalic. Oropharynx and nasopharynx clear.  NECK: Supple, no jugular venous distention. No thyroid enlargement, no tenderness.  LUNGS: Normal breath sounds bilaterally, no wheezing, rales,rhonchi or crepitation. No use of accessory muscles of respiration.  CARDIOVASCULAR: S1, S2  fast. No murmurs, rubs, or gallops.  ABDOMEN: Soft, nontender, nondistended. Bowel sounds present. No organomegaly or mass.  EXTREMITIES: No pedal edema, cyanosis, or clubbing.  NEUROLOGIC: Cranial nerves II through XII are intact. Muscle strength 5/5 in upper extremities 4/5 lower. Sensation intact. Gait not checked.  PSYCHIATRIC: The patient is alert and oriented x 3.  SKIN: No obvious rash, lesion, or ulcer.   Physical Exam LABORATORY PANEL:   CBC  Recent Labs Lab 11/23/14 0501  WBC 0.5*  HGB 11.8*  HCT 34.9*  PLT 32*   ------------------------------------------------------------------------------------------------------------------  Chemistries   Recent Labs Lab 11/23/14 0501  NA 135  K 3.8  CL 107  CO2 22  GLUCOSE 79  BUN 28*  CREATININE 0.84  CALCIUM 7.0*   ------------------------------------------------------------------------------------------------------------------  Cardiac Enzymes No results for input(s): TROPONINI in the last 168 hours. ------------------------------------------------------------------------------------------------------------------  RADIOLOGY:  Dg Chest Port 1 View  11/21/2014  CLINICAL DATA:  Patient with fever. On chemotherapy for uterine carcinoma. EXAM: PORTABLE CHEST 1 VIEW COMPARISON:  2/20 6/6 FINDINGS: Heart, mediastinum hila are unremarkable. Lungs are clear. No pleural effusion or pneumothorax. Bony thorax is grossly intact. Right anterior chest wall Port-A-Cath is stable and well positioned. IMPRESSION: No acute cardiopulmonary disease. Electronically Signed   By: Lajean Manes M.D.   On: 11/21/2014 11:41    ASSESSMENT AND PLAN:   Principal Problem:   Neutropenic fever ([place]) Active Problems:   Diarrhea  * Neutropenic fever Blood culture urine culture sent- urine have GNR- awaited further identity,  stool for culture- collected and C. difficile negative. Tressie Ellis for now.  UA positive, awaited final report on ur  cx. appreciated  Oncology consult.   Given neupogen for neutropenia.  * Thrombocytopenia  transfuse per Oncology.   No active bleeding.  * Sinus tachycardia This may be as a result of fever and mild dehydration due to diarrhea We'll give IV fluid and continue monitoring. She is on atenolol at home will continue that.   Now HR normal.  * History of DVT She is on Coumadin will continue that. Pharmacy to help manage the dose.  * Endometrial cancer with metastasis She is on chemotherapy , oncology for further management.  * Hypertension We'll continue atenolol.  * Hyperkalemia   Oral kayexalate.  All the records are reviewed and case discussed with Care Management/Social Workerr. Management plans discussed with the patient, family and they are in agreement.  CODE STATUS: full  TOTAL TIME TAKING CARE OF THIS PATIENT: 35 minutes.     POSSIBLE D/C IN 1-2 DAYS, DEPENDING ON CLINICAL CONDITION.   Vaughan Basta M.D on 11/23/2014   Between 7am to 6pm - Pager - [phone]  After 6pm go to [email or web address] - password [email or web address] [facility]  Office  [phone]  CC: Primary care physician; Ashok Norris, MD  Note: This dictation was prepared with Dragon dictation along with smaller phrase technology. Any transcriptional errors that result from this process are unintentional.

## 2014-11-23 NOTE — Progress Notes (Signed)
ANTICOAGULATION CONSULT NOTE - Follow UP  Pharmacy Consult for Coumadin Indication: History of DVT  Allergies  Allergen Reactions  . Sulfa Antibiotics Anaphylaxis    Patient Measurements: Height: 5\' 4"  (162.6 cm) Weight: 158 lb 11.2 oz (71.986 kg) IBW/kg (Calculated) : 54.7  Vital Signs: Temp: 97.8 F (36.6 C) (11/20 0525) Temp Source: Oral (11/20 0525) BP: 125/88 mmHg (11/20 0525) Pulse Rate: 105 (11/20 0525)  Labs:  Recent Labs  11/21/14 1053 11/21/14 1643 11/22/14 0335 11/23/14 0501  HGB 12.8  --  12.6 11.8*  HCT 39.0  --  38.1 34.9*  PLT 52*  --  40* 32*  LABPROT  --  25.3* 25.0* 28.9*  INR  --  2.33 2.29 2.78  CREATININE 1.18*  --  0.95 0.84    Estimated Creatinine Clearance: 68.4 mL/min (by C-G formula based on Cr of 0.84).   Medical History: Past Medical History  Diagnosis Date  . Cancer ([place])   . Primary cancer of endometrium ([place]) 05/24/2014  . Hypertension   . Knee pain, bilateral   . Arthritis   . Renal insufficiency     Medications:  Prescriptions prior to admission  Medication Sig Dispense Refill Last Dose  . atenolol (TENORMIN) 100 MG tablet Take 1 tablet (100 mg total) by mouth daily. 30 tablet 5 Past Week at Unknown time  . citalopram (CELEXA) 40 MG tablet Take 1 tablet (40 mg total) by mouth daily. 30 tablet 2 Past Week at Unknown time  . cyclobenzaprine (FLEXERIL) 5 MG tablet Take 5 mg by mouth 3 (three) times daily as needed for muscle spasms.   Past Week at Unknown time  . dexamethasone (DECADRON) 4 MG tablet Take 8 mg by mouth 2 (two) times daily. Pt is to take the day before chemo(Taxotere).   Past Week at Unknown time  . famotidine (PEPCID) 20 MG tablet Take 1 tablet (20 mg total) by mouth 2 (two) times daily. 60 tablet 5 Past Week at Unknown time  . fentaNYL (DURAGESIC - DOSED MCG/HR) 75 MCG/HR Place 1 patch (75 mcg total) onto the skin every 3 (three) days. 10 patch 0 11/18/2014 at unknown  . lidocaine-prilocaine (EMLA) cream Apply  1 application topically as needed. (Patient taking differently: Apply 1 application topically as needed (prior to accessing port). ) 30 g 3 Past Week at Unknown time  . loratadine (CLARITIN) 10 MG tablet Take 1 tablet (10 mg total) by mouth daily. 30 tablet 5 Past Week at Unknown time  . mirtazapine (REMERON) 15 MG tablet Take 15 mg by mouth at bedtime.   Past Week at Unknown time  . Multiple Vitamin (MULTIVITAMIN WITH MINERALS) TABS tablet Take 1 tablet by mouth daily.   Past Week at Unknown time  . ondansetron (ZOFRAN) 4 MG tablet Take 1 tablet (4 mg total) by mouth every 6 (six) hours as needed. (Patient taking differently: Take 4 mg by mouth every 6 (six) hours as needed for nausea or vomiting. ) 30 tablet 3 Past Week at Unknown time  . oxyCODONE (ROXICODONE) 15 MG immediate release tablet Take 2 tablets (30 mg total) by mouth every 2 (two) hours as needed for pain. 90 tablet 0 Past Week at Unknown time  . psyllium (METAMUCIL) 58.6 % powder Take 1 packet by mouth 3 (three) times daily as needed (for constipation).    Past Week at Unknown time  . senna-docusate (SENOKOT-S) 8.6-50 MG tablet Take 1 tablet by mouth 2 (two) times daily.   Past Week at  Unknown time  . sucralfate (CARAFATE) 1 G tablet Take 1 tablet (1 g total) by mouth 4 (four) times daily -  with meals and at bedtime. 90 tablet 3 Past Week at Unknown time  . warfarin (COUMADIN) 5 MG tablet Take 1 tablet (5 mg total) by mouth daily at 6 PM. 30 tablet 1 Past Week at Unknown time   Scheduled:  . aspirin  325 mg Oral Daily  . atenolol  100 mg Oral Daily  . cefTAZidime (FORTAZ)  IV  2 g Intravenous 3 times per day  . citalopram  40 mg Oral Daily  . famotidine  20 mg Oral BID  . fentaNYL  75 mcg Transdermal Q72H  . loratadine  10 mg Oral Daily  . mirtazapine  15 mg Oral QHS  . multivitamin with minerals  1 tablet Oral Daily  . senna-docusate  1 tablet Oral BID  . sucralfate  1 g Oral TID WC & HS  . Tbo-Filgrastim  480 mcg Subcutaneous  Daily  . warfarin  3 mg Oral q1800  . Warfarin - Pharmacist Dosing Inpatient   Does not apply q1800    Assessment: Patient is a 59yo female admitted for neutropenic fever. Patient was taking Coumadin 5mg  daily prior to admission. INR on admission was 2.33. Today INR 2.78 and PTT 29.  Patients platelets at 32 today, down from 40 yesterday.   Goal of Therapy:  INR 2-3 Monitor platelets by anticoagulation protocol: Yes   Plan:  Spoke with Dr. Anselm Jungling concerning patients low platelets and increased risk of bleeding. He wishes to continue the warfarin. Spoke to nurse yesterday about monitoring patient closely for signs/Sx of bleeding.  Do to patient being ill and jump in INR will give one dose of 3mg  tonight. Will antibiotics and illness, expecting to see another jump in INR tomorrow.  INR/PT ordered for AM, Pharmacy will continue to monitor levels and adjust dose as needed.  Will continue with home dose of Coumadin 5mg  daily and check daily INRs. Will adjust dose based on INR results.  Nancy Fetter, PharmD Pharmacy Resident

## 2014-11-23 NOTE — Progress Notes (Signed)
Emily Zamora   DOB:1955-03-10   [id number]    CC: Neutropenic fever  Subjective: Patient feels markedly better since admission. However still continues to feel weak. Denies any fevers. No nausea no vomiting. Diarrhea improving.  ROS: No bleeding. No headaches Objective:  Filed Vitals:   11/23/14 0525  BP: 125/88  Pulse: 105  Temp: 97.8 F (36.6 C)  Resp: 18     Intake/Output Summary (Last 24 hours) at 11/23/14 [id number] Last data filed at 11/23/14 [id number]  Gross per 24 hour  Intake   1120 ml  Output    350 ml  Net    770 ml    GENERAL: Well-nourished well-developed; Alert, no distress and comfortable. She is accompanied by her son by the bedside. She appears weak overall.  EYES: no pallor or icterus OROPHARYNX: no thrush or ulceration; good dentition  NECK: supple, no masses felt LYMPH: no palpable lymphadenopathy in the cervical, axillary or inguinal regions LUNGS: clear to auscultation and No wheeze or crackles HEART/CVS: regular rate & rhythm and no murmurs; No lower extremity edema ABDOMEN: abdomen soft, non-tender and normal bowel sounds Musculoskeletal:no cyanosis of digits and no clubbing  PSYCH: alert & oriented x 3 with fluent speech NEURO: no focal motor/sensory deficits SKIN: no rashes or significant lesions   Labs:  Lab Results  Component Value Date   WBC 0.5* 11/23/2014   HGB 11.8* 11/23/2014   HCT 34.9* 11/23/2014   MCV 86.5 11/23/2014   PLT 32* 11/23/2014   NEUTROABS 1.2* 11/21/2014    Lab Results  Component Value Date   NA 135 11/23/2014   K 3.8 11/23/2014   CL 107 11/23/2014   CO2 22 11/23/2014    Studies:  Dg Chest Port 1 View  11/21/2014  CLINICAL DATA:  Patient with fever. On chemotherapy for uterine carcinoma. EXAM: PORTABLE CHEST 1 VIEW COMPARISON:  2/20 6/6 FINDINGS: Heart, mediastinum hila are unremarkable. Lungs are clear. No pleural effusion or pneumothorax. Bony thorax is grossly intact. Right anterior chest wall Port-A-Cath is  stable and well positioned. IMPRESSION: No acute cardiopulmonary disease. Electronically Signed   By: Lajean Manes M.D.   On: 11/21/2014 11:41    Assessment & Plan:   59 year old female patient with a history of recurrent /metastatic uterine cancer currently on chemotherapy with cisplatin and docetaxel is currently admitted to the hospital for neutropenic fever   # Neutropenic fever- source likely UTI- urine culture positive for gram-negative rods. Blood cultures pending. Agree with ceftazidime. Clinically improving. However white count still 0.5. Neupogen day #2  # History of PE on Coumadin/INR 2.3- complicated by thrombocytopenia platelets of 32 from chemotherapy  # Uterine cancer- currently on cisplatin and docetaxel cycle 1 #7.    # Diarrhea- likely from Taxotere.; C. difficile negative. Improving.  Recommendations:  # Continue IV antibiotics; antibiotics to be narrowed down based upon final urine culture/blood cultures  # Continue Neupogen for 4-5 days more  # Given the platelet count in 30s/fact patient is on Coumadin- high risk for bleeding; I recommend platelet transfusion 1 unit   # Diarrhea-symptomatic management.  # Recommend sitting in a chair/using spirometry   The above plan was discussed with Dr. Marthann Schiller.  Plan was discussed with the patient /son. Chest x-ray independently reviewed.    Cammie Sickle, MD 11/23/2014  9:21 AM

## 2014-11-23 NOTE — Progress Notes (Signed)
Spoke with Dr. Anselm Jungling about pt's increased HR(120-130s) when pt ambulates to the restroom HR is 140s.  MD gave ok to give 1700 dose of atenolol 100mg .  Clarise Cruz, RN

## 2014-11-23 NOTE — Plan of Care (Signed)
Problem: Education: Goal: Knowledge of [facility] Education information/materials will improve Outcome: Progressing Pt educated on receiving blood product, verbalized understanding.    Pt received 1 unit of platelet, tolerated well, no s/s of reactions.  Pt c/o pain in her leg, prn meds given with improvement, resting comfortably in bed. Up to BR several time with assistance.

## 2014-11-23 NOTE — Progress Notes (Signed)
Pharmacy Antibiotic Time-Out Note  Emily Zamora is a 59 y.o. year-old female admitted on 11/21/2014.  The patient is currently on Ceftazidime for Febrile neutropenia/UTI.  Assessment/Plan: Urine Cultures showing pansensitive E.Coli. Recommended deescalation. Dr. Anselm Jungling wishes to start cephalexin and ceftazidime was d'cd..  Temp (24hrs), Avg:98.4 F (36.9 C), Min:97.8 F (36.6 C), Max:99.1 F (37.3 C)   Recent Labs Lab 11/21/14 1053 11/22/14 0335 11/23/14 0501  WBC 1.5* 0.6* 0.5*    Recent Labs Lab 11/21/14 1053 11/22/14 0335 11/23/14 0501  CREATININE 1.18* 0.95 0.84   Estimated Creatinine Clearance: 68.4 mL/min (by C-G formula based on Cr of 0.84).   Antimicrobial allergies: Sulfa  Antimicrobials this admission: 11/18 >> Ceftazidime   Microbiology Results: 11/18 BCx: NG x 2 days 11/18 UCx: E.Coli   Thank you for allowing pharmacy to be a part of this patient's care.  Pernell Dupre PharmD 11/23/2014 12:06 PM

## 2014-11-24 ENCOUNTER — Inpatient Hospital Stay: Payer: No Typology Code available for payment source

## 2014-11-24 LAB — PROTIME-INR
INR: 2.09
PROTHROMBIN TIME: 23.3 s — AB (ref 11.4–15.0)

## 2014-11-24 LAB — PREPARE PLATELET PHERESIS: Unit division: 0

## 2014-11-24 LAB — CBC
HCT: 30.7 % — ABNORMAL LOW (ref 35.0–47.0)
Hemoglobin: 10.2 g/dL — ABNORMAL LOW (ref 12.0–16.0)
MCH: 29.1 pg (ref 26.0–34.0)
MCHC: 33.4 g/dL (ref 32.0–36.0)
MCV: 87.3 fL (ref 80.0–100.0)
PLATELETS: 56 10*3/uL — AB (ref 150–440)
RBC: 3.52 MIL/uL — ABNORMAL LOW (ref 3.80–5.20)
RDW: 15.7 % — ABNORMAL HIGH (ref 11.5–14.5)
WBC: 0.5 10*3/uL — CL (ref 3.6–11.0)

## 2014-11-24 MED ORDER — DILTIAZEM HCL 30 MG PO TABS: 30.0000 mg | ORAL_TABLET | Freq: Two times a day (BID) | ORAL | Status: DC

## 2014-11-24 MED ORDER — WARFARIN SODIUM 5 MG PO TABS
5.0000 mg | ORAL_TABLET | Freq: Every day | ORAL | Status: DC
  Administered 2014-11-24 – 2014-11-25 (×2): 5 mg via ORAL
  Filled 2014-11-24 (×2): qty 1

## 2014-11-24 MED ORDER — SODIUM CHLORIDE 0.9 % IV BOLUS (SEPSIS)
500.0000 mL | Freq: Once | INTRAVENOUS | Status: AC
  Administered 2014-11-24: 15:00:00 500 mL via INTRAVENOUS

## 2014-11-24 MED ORDER — METOPROLOL TARTRATE 1 MG/ML IV SOLN: 5.0000 mg | INTRAVENOUS | Status: DC | PRN

## 2014-11-24 NOTE — Care Management (Signed)
Admitted to [facility] with the diagnosis of neutropenic fever. Lives alone. Sister is Gildardo Pounds [phone]). Seen Dr. Rutherford Nail last August. Sees Dr. Jeb Levering at the [facility]. Last chemotherapy treatment was a week ago today. Radiation may start next week. Followed by [facility], but they have not been in the home yet due to scheduling conflicts. . No skilled facility. No home oxygen. Uses a rolling walker to aid in ambulation. Takes care of all basic activities of daily living herself, drives. Fell 2 weeks ago. Decreased appetite. Sister will transport. Shelbie Ammons RN MSN CCM Care Management [phone]

## 2014-11-24 NOTE — Progress Notes (Signed)
[facility]  Patient Status: active  [facility] is providing the following services: PT  If patient discharges after hours, please call [phone].   Emily Zamora 11/24/2014, 11:34 AM

## 2014-11-24 NOTE — Progress Notes (Signed)
Emily Zamora   DOB:10-Apr-1955   [id number]    CC: Neutropenic fever  Subjective: Patient having breakfast in the bed. She feels improved/appears nontoxic- however continues to feel weak. No bleeding issues overnight. Later transfusion uneventful.Denies any fevers. No nausea no vomiting. Diarrhea improving.  ROS:  No headaches Objective:  Filed Vitals:   11/23/14 2202 11/24/14 0556  BP: 105/64 105/68  Pulse: 103 106  Temp: 98.7 F (37.1 C) 98.2 F (36.8 C)  Resp:  18     Intake/Output Summary (Last 24 hours) at 11/24/14 0827 Last data filed at 11/24/14 0555  Gross per 24 hour  Intake 2026.25 ml  Output      0 ml  Net 2026.25 ml    GENERAL: Well-nourished well-developed; Alert, no distress and comfortable. She is accompanied by her son by the bedside. She appears weak overall.  EYES: no pallor or icterus OROPHARYNX: no thrush or ulceration; good dentition  NECK: supple, no masses felt LYMPH: no palpable lymphadenopathy in the cervical, axillary or inguinal regions LUNGS: clear to auscultation and No wheeze or crackles HEART/CVS: regular rate & rhythm and no murmurs; No lower extremity edema ABDOMEN: abdomen soft, non-tender and normal bowel sounds Musculoskeletal:no cyanosis of digits and no clubbing  PSYCH: alert & oriented x 3 with fluent speech NEURO: no focal motor/sensory deficits SKIN: no rashes or significant lesions   Labs:  Lab Results  Component Value Date   WBC 0.5* 11/24/2014   HGB 10.2* 11/24/2014   HCT 30.7* 11/24/2014   MCV 87.3 11/24/2014   PLT 56* 11/24/2014   NEUTROABS 1.2* 11/21/2014    Lab Results  Component Value Date   NA 135 11/23/2014   K 3.8 11/23/2014   CL 107 11/23/2014   CO2 22 11/23/2014    Studies:  No results found.  Assessment & Plan:   60 year old female patient with a history of recurrent /metastatic uterine cancer currently on chemotherapy with cisplatin and docetaxel is currently admitted to the hospital for  neutropenic fever   # Neutropenic fever- source likely UTI- urine culture positive for Escherichia coli. Blood cultures negative Clinically improving. However white count still 0.5. Neupogen day #3  # History of PE on Coumadin/INR 2.3/thrombus cytopenia status post transfusion of platelet-   # Uterine cancer- currently on cisplatin and docetaxel cycle 1 #8  # Diarrhea- likely from Taxotere.-Improved  Recommendations:  # Continue IV antibiotics; defer to hospitalist to narrowed on the antibiotic.  # Continue Neupogen for 4-5 days more  # Status post platelet transfusion-  and platelets are up to 52  # Recommend sitting in a chair/using spirometry; I would recommend continued IV antibiotics until [name] is above 1000; when she can potentially be discharged   The above plan was discussed with Dr. Marthann Schiller.  Plan was discussed with the patient /son. Chest x-ray independently reviewed.    Cammie Sickle, MD 11/24/2014  8:27 AM

## 2014-11-24 NOTE — Progress Notes (Signed)
ANTICOAGULATION CONSULT NOTE - Follow UP  Pharmacy Consult for Coumadin Indication: History of DVT  Allergies  Allergen Reactions  . Sulfa Antibiotics Anaphylaxis    Patient Measurements: Height: 5\' 4"  (162.6 cm) Weight: 158 lb 11.2 oz (71.986 kg) IBW/kg (Calculated) : 54.7 Heparin Dosing Weight: na  Vital Signs: Temp: 98.2 F (36.8 C) (11/21 0556) Temp Source: Oral (11/21 0556) BP: 105/68 mmHg (11/21 0556) Pulse Rate: 106 (11/21 0556)  Labs:  Recent Labs  11/21/14 1053  11/22/14 0335 11/23/14 0501 11/24/14 0519  HGB 12.8  --  12.6 11.8* 10.2*  HCT 39.0  --  38.1 34.9* 30.7*  PLT 52*  --  40* 32* 56*  LABPROT  --   < > 25.0* 28.9* 23.3*  INR  --   < > 2.29 2.78 2.09  CREATININE 1.18*  --  0.95 0.84  --   < > = values in this interval not displayed.  Estimated Creatinine Clearance: 68.4 mL/min (by C-G formula based on Cr of 0.84).   Medical History: Past Medical History  Diagnosis Date  . Cancer ([place])   . Primary cancer of endometrium ([place]) 05/24/2014  . Hypertension   . Knee pain, bilateral   . Arthritis   . Renal insufficiency     Medications:  Prescriptions prior to admission  Medication Sig Dispense Refill Last Dose  . atenolol (TENORMIN) 100 MG tablet Take 1 tablet (100 mg total) by mouth daily. 30 tablet 5 Past Week at Unknown time  . citalopram (CELEXA) 40 MG tablet Take 1 tablet (40 mg total) by mouth daily. 30 tablet 2 Past Week at Unknown time  . cyclobenzaprine (FLEXERIL) 5 MG tablet Take 5 mg by mouth 3 (three) times daily as needed for muscle spasms.   Past Week at Unknown time  . dexamethasone (DECADRON) 4 MG tablet Take 8 mg by mouth 2 (two) times daily. Pt is to take the day before chemo(Taxotere).   Past Week at Unknown time  . famotidine (PEPCID) 20 MG tablet Take 1 tablet (20 mg total) by mouth 2 (two) times daily. 60 tablet 5 Past Week at Unknown time  . fentaNYL (DURAGESIC - DOSED MCG/HR) 75 MCG/HR Place 1 patch (75 mcg total) onto the  skin every 3 (three) days. 10 patch 0 11/18/2014 at unknown  . lidocaine-prilocaine (EMLA) cream Apply 1 application topically as needed. (Patient taking differently: Apply 1 application topically as needed (prior to accessing port). ) 30 g 3 Past Week at Unknown time  . loratadine (CLARITIN) 10 MG tablet Take 1 tablet (10 mg total) by mouth daily. 30 tablet 5 Past Week at Unknown time  . mirtazapine (REMERON) 15 MG tablet Take 15 mg by mouth at bedtime.   Past Week at Unknown time  . Multiple Vitamin (MULTIVITAMIN WITH MINERALS) TABS tablet Take 1 tablet by mouth daily.   Past Week at Unknown time  . ondansetron (ZOFRAN) 4 MG tablet Take 1 tablet (4 mg total) by mouth every 6 (six) hours as needed. (Patient taking differently: Take 4 mg by mouth every 6 (six) hours as needed for nausea or vomiting. ) 30 tablet 3 Past Week at Unknown time  . oxyCODONE (ROXICODONE) 15 MG immediate release tablet Take 2 tablets (30 mg total) by mouth every 2 (two) hours as needed for pain. 90 tablet 0 Past Week at Unknown time  . psyllium (METAMUCIL) 58.6 % powder Take 1 packet by mouth 3 (three) times daily as needed (for constipation).    Past  Week at Unknown time  . senna-docusate (SENOKOT-S) 8.6-50 MG tablet Take 1 tablet by mouth 2 (two) times daily.   Past Week at Unknown time  . sucralfate (CARAFATE) 1 G tablet Take 1 tablet (1 g total) by mouth 4 (four) times daily -  with meals and at bedtime. 90 tablet 3 Past Week at Unknown time  . warfarin (COUMADIN) 5 MG tablet Take 1 tablet (5 mg total) by mouth daily at 6 PM. 30 tablet 1 Past Week at Unknown time   Scheduled:  . aspirin  325 mg Oral Daily  . atenolol  100 mg Oral Daily  . cephALEXin  500 mg Oral Q12H  . citalopram  40 mg Oral Daily  . famotidine  20 mg Oral BID  . fentaNYL  75 mcg Transdermal Q72H  . loratadine  10 mg Oral Daily  . mirtazapine  15 mg Oral QHS  . multivitamin with minerals  1 tablet Oral Daily  . senna-docusate  1 tablet Oral BID   . sucralfate  1 g Oral TID WC & HS  . Tbo-Filgrastim  480 mcg Subcutaneous Daily  . warfarin  3 mg Oral q1800  . Warfarin - Pharmacist Dosing Inpatient   Does not apply q1800    Assessment: Patient is a 59yo female admitted for neutropenic fever. Patient was taking Coumadin 5mg  daily prior to admission. INR on admission was 2.33. Today INR 2.09 and PTT 32. Pt has platelets transfusion   Goal of Therapy:  INR 2-3 Monitor platelets by anticoagulation protocol: Yes   Plan:  Will continue with home dose of Coumadin 5mg  daily and check daily INRs. Will adjust dose based on INR results.  Pharmacy will continue to monitor labs.   Ramond Dial, Pharm.D Clinical Pharmacist

## 2014-11-24 NOTE — Plan of Care (Signed)
Problem: Education: Goal: Knowledge of [facility] Education information/materials will improve Outcome: Progressing Patient remaining on protective precautions. WBC 0.5. grannix given daily. C/o pain in left leg, oxycodone given PRN. MD ordered 546ml bolus given for BP 100/70 and elevated HR. IVF infusing. Son at the bedside. Standby assist.

## 2014-11-24 NOTE — Progress Notes (Signed)
[place] [facility] [place] NAME: Emily Zamora    MR#:  [id number]  DATE OF BIRTH:  06/19/1955  SUBJECTIVE:  CHIEF COMPLAINT:   Chief Complaint  Patient presents with  . Weakness    Still weak- but it is chronic for her as per son for last few weeks to months.   No more fever.   Resting comfortably. Denies any complaints. Received platelet transfusion yesterday. no active bleed.  REVIEW OF SYSTEMS:   CONSTITUTIONAL: negative  for fever in last 36 hrs, positive for fatigue or weakness.  EYES: No blurred or double vision.  EARS, NOSE, AND THROAT: No tinnitus or ear pain.  RESPIRATORY: No cough, shortness of breath, wheezing or hemoptysis.  CARDIOVASCULAR: No chest pain, orthopnea, edema.  GASTROINTESTINAL: No nausea, vomiting, positive for diarrhea but no abdominal pain.  GENITOURINARY: No dysuria, hematuria.  ENDOCRINE: No polyuria, nocturia,  HEMATOLOGY: No anemia, easy bruising or bleeding SKIN: No rash or lesion. MUSCULOSKELETAL: No joint pain or arthritis.  NEUROLOGIC: No tingling, numbness, have generalized weakness due to underlying cancer.  PSYCHIATRY: No anxiety or depression.  ROS  DRUG ALLERGIES:   Allergies  Allergen Reactions  . Sulfa Antibiotics Anaphylaxis    VITALS:  Blood pressure 100/71, pulse 129, temperature 99.6 F (37.6 C), temperature source Oral, resp. rate 20, height 5\' 4"  (1.626 m), weight 71.986 kg (158 lb 11.2 oz), SpO2 100 %.  PHYSICAL EXAMINATION:   GENERAL:59 y.o.-year-old patient lying in the bed with no acute distress.  EYES: Pupils equal, round, reactive to light and accommodation. No scleral icterus. Extraocular muscles intact.  HEENT: Head atraumatic, normocephalic. Oropharynx and nasopharynx clear.  NECK: Supple, no jugular venous distention. No thyroid enlargement, no tenderness.  LUNGS: Normal breath sounds bilaterally, no wheezing, rales,rhonchi or crepitation. No use of  accessory muscles of respiration.  CARDIOVASCULAR: S1, S2 , tachycardic. No murmurs, rubs, or gallops.  ABDOMEN: Soft, nontender, nondistended. Bowel sounds present. No organomegaly or mass.  EXTREMITIES: No pedal edema, cyanosis, or clubbing.  NEUROLOGIC: Cranial nerves II through XII are intact. Muscle strength 5/5 in upper extremities 4/5 lower. Sensation intact. Gait not checked.  PSYCHIATRIC: The patient is alert and oriented x 3.  SKIN: No obvious rash, lesion, or ulcer.   Physical Exam LABORATORY PANEL:   CBC  Recent Labs Lab 11/24/14 0519  WBC 0.5*  HGB 10.2*  HCT 30.7*  PLT 56*   ------------------------------------------------------------------------------------------------------------------  Chemistries   Recent Labs Lab 11/23/14 0501  NA 135  K 3.8  CL 107  CO2 22  GLUCOSE 79  BUN 28*  CREATININE 0.84  CALCIUM 7.0*   ------------------------------------------------------------------------------------------------------------------  Cardiac Enzymes No results for input(s): TROPONINI in the last 168 hours. ------------------------------------------------------------------------------------------------------------------  RADIOLOGY:  No results found.  ASSESSMENT AND PLAN:   Principal Problem:   Neutropenic fever ([place]) Active Problems:   Diarrhea  * Sepsis secondary to acute cystitis  and Neutropenic fever Blood culture negative so far  stool for culture- collected and C. difficile negative. Tressie Ellis for now. appreciated Oncology consult.   Given neupogen for neutropenia.  *Acute cystitis with Escherichia coli pansensitive Continue Fortaz  * Thrombocytopenia-better today with platelet count of 56,000  Status post platelet transfusion as recommended by Oncology 11/23/2014   No active bleeding.  * Sinus tachycardia could be from problem #1 with sepsis Will provide IV fluids and reassess the patient This may be as a result of fever and  mild dehydration due to diarrhea She is  on atenolol at home will continue that.     * History of DVT She is on Coumadin will continue that. INR at 2.09 today Pharmacy to help manage the dose.  * Endometrial cancer with metastasis She is on chemotherapy , oncology for further management.  * Hypertension We'll continue atenolol.  * Hyperkalemia  Resolved with Oral kayexalate.  All the records are reviewed and case discussed with Care Management/Social Workerr. Management plans discussed with the patient, family and they are in agreement.  CODE STATUS: full  TOTAL TIME TAKING CARE OF THIS PATIENT: 35 minutes.     POSSIBLE D/C IN 1-2 DAYS, DEPENDING ON CLINICAL CONDITION.   Nicholes Mango M.D on 11/24/2014   Between 7am to 6pm - Pager - [phone]  After 6pm go to [email or web address] - password [email or web address] [facility]  Office  [phone]  CC: Primary care physician; Ashok Norris, MD  Note: This dictation was prepared with Dragon dictation along with smaller phrase technology. Any transcriptional errors that result from this process are unintentional.

## 2014-11-25 ENCOUNTER — Inpatient Hospital Stay: Payer: No Typology Code available for payment source

## 2014-11-25 LAB — CBC
HCT: 26.1 % — ABNORMAL LOW (ref 35.0–47.0)
Hemoglobin: 8.7 g/dL — ABNORMAL LOW (ref 12.0–16.0)
MCH: 28.7 pg (ref 26.0–34.0)
MCHC: 33.3 g/dL (ref 32.0–36.0)
MCV: 86 fL (ref 80.0–100.0)
PLATELETS: 44 10*3/uL — AB (ref 150–440)
RBC: 3.03 MIL/uL — ABNORMAL LOW (ref 3.80–5.20)
RDW: 15.5 % — AB (ref 11.5–14.5)
WBC: 0.5 10*3/uL — AB (ref 3.6–11.0)

## 2014-11-25 LAB — STOOL CULTURE

## 2014-11-25 LAB — PROTIME-INR
INR: 1.99
Prothrombin Time: 22.5 seconds — ABNORMAL HIGH (ref 11.4–15.0)

## 2014-11-25 MED ORDER — ACETAMINOPHEN 325 MG PO TABS
650.0000 mg | ORAL_TABLET | Freq: Four times a day (QID) | ORAL | Status: DC | PRN
  Administered 2014-11-25 – 2014-11-28 (×3): 650 mg via ORAL
  Filled 2014-11-25 (×3): qty 2

## 2014-11-25 MED ORDER — DEXTROSE 5 % IV SOLN
2.0000 g | Freq: Three times a day (TID) | INTRAVENOUS | Status: DC
  Administered 2014-11-25 – 2014-11-28 (×8): 2 g via INTRAVENOUS
  Filled 2014-11-25 (×11): qty 2

## 2014-11-25 NOTE — Plan of Care (Signed)
Problem: Education: Goal: Knowledge of [facility] Education information/materials will improve Outcome: Progressing Pt is alert and oriented, no c/o pain at this time. IV fluids continued with antibiotics.  High Fall Risk, calls for assistance when needed. Son at bedside.

## 2014-11-25 NOTE — Progress Notes (Signed)
Patient had no c/o pain this shift VSS except for temp of 100.7 which came back down to 99 after 650 mg of Tylenol was given  Up to BR with one person assist  Patient started on IV Fortaz this shift WBC remain at 0.5 MD aware

## 2014-11-25 NOTE — Progress Notes (Signed)
ANTICOAGULATION CONSULT NOTE - Follow UP  Pharmacy Consult for Coumadin Indication: History of DVT  Allergies  Allergen Reactions  . Sulfa Antibiotics Anaphylaxis    Patient Measurements: Height: 5\' 4"  (162.6 cm) Weight: 158 lb 11.2 oz (71.986 kg) IBW/kg (Calculated) : 54.7 Heparin Dosing Weight: na  Vital Signs: Temp: 98.6 F (37 C) (11/22 0530) Temp Source: Oral (11/22 0530) BP: 98/51 mmHg (11/22 0530) Pulse Rate: 95 (11/22 0530)  Labs:  Recent Labs  11/23/14 0501 11/24/14 0519 11/25/14 0557  HGB 11.8* 10.2* 8.7*  HCT 34.9* 30.7* 26.1*  PLT 32* 56* 44*  LABPROT 28.9* 23.3* 22.5*  INR 2.78 2.09 1.99  CREATININE 0.84  --   --     Estimated Creatinine Clearance: 68.4 mL/min (by C-G formula based on Cr of 0.84).   Medical History: Past Medical History  Diagnosis Date  . Cancer ([place])   . Primary cancer of endometrium ([place]) 05/24/2014  . Hypertension   . Knee pain, bilateral   . Arthritis   . Renal insufficiency     Medications:  Prescriptions prior to admission  Medication Sig Dispense Refill Last Dose  . atenolol (TENORMIN) 100 MG tablet Take 1 tablet (100 mg total) by mouth daily. 30 tablet 5 Past Week at Unknown time  . citalopram (CELEXA) 40 MG tablet Take 1 tablet (40 mg total) by mouth daily. 30 tablet 2 Past Week at Unknown time  . cyclobenzaprine (FLEXERIL) 5 MG tablet Take 5 mg by mouth 3 (three) times daily as needed for muscle spasms.   Past Week at Unknown time  . dexamethasone (DECADRON) 4 MG tablet Take 8 mg by mouth 2 (two) times daily. Pt is to take the day before chemo(Taxote[facility]).   Past Week at Unknown time  . famotidine (PEPCID) 20 MG tablet Take 1 tablet (20 mg total) by mouth 2 (two) times daily. 60 tablet 5 Past Week at Unknown time  . fentaNYL (DURAGESIC - DOSED MCG/HR) 75 MCG/HR Place 1 patch (75 mcg total) onto the skin every 3 (three) days. 10 patch 0 11/18/2014 at unknown  . lidocaine-prilocaine (EMLA) cream Apply 1 application  topically as needed. (Patient taking differently: Apply 1 application topically as needed (prior to accessing port). ) 30 g 3 Past Week at Unknown time  . loratadine (CLARITIN) 10 MG tablet Take 1 tablet (10 mg total) by mouth daily. 30 tablet 5 Past Week at Unknown time  . mirtazapine (REMERON) 15 MG tablet Take 15 mg by mouth at bedtime.   Past Week at Unknown time  . Multiple Vitamin (MULTIVITAMIN WITH MINERALS) TABS tablet Take 1 tablet by mouth daily.   Past Week at Unknown time  . ondansetron (ZOFRAN) 4 MG tablet Take 1 tablet (4 mg total) by mouth every 6 (six) hours as needed. (Patient taking differently: Take 4 mg by mouth every 6 (six) hours as needed for nausea or vomiting. ) 30 tablet 3 Past Week at Unknown time  . oxyCODONE (ROXICODONE) 15 MG immediate release tablet Take 2 tablets (30 mg total) by mouth every 2 (two) hours as needed for pain. 90 tablet 0 Past Week at Unknown time  . psyllium (METAMUCIL) 58.6 % powder Take 1 packet by mouth 3 (three) times daily as needed (for constipation).    Past Week at Unknown time  . senna-docusate (SENOKOT-S) 8.6-50 MG tablet Take 1 tablet by mouth 2 (two) times daily.   Past Week at Unknown time  . sucralfate (CARAFATE) 1 G tablet Take 1 tablet (  1 g total) by mouth 4 (four) times daily -  with meals and at bedtime. 90 tablet 3 Past Week at Unknown time  . warfarin (COUMADIN) 5 MG tablet Take 1 tablet (5 mg total) by mouth daily at 6 PM. 30 tablet 1 Past Week at Unknown time   Scheduled:  . aspirin  325 mg Oral Daily  . atenolol  100 mg Oral Daily  . cephALEXin  500 mg Oral Q12H  . citalopram  40 mg Oral Daily  . famotidine  20 mg Oral BID  . fentaNYL  75 mcg Transdermal Q72H  . loratadine  10 mg Oral Daily  . mirtazapine  15 mg Oral QHS  . multivitamin with minerals  1 tablet Oral Daily  . senna-docusate  1 tablet Oral BID  . sucralfate  1 g Oral TID WC & HS  . Tbo-Filgrastim  480 mcg Subcutaneous Daily  . warfarin  5 mg Oral q1800  .  Warfarin - Pharmacist Dosing Inpatient   Does not apply q1800    Assessment: Patient is a 59yo female admitted for neutropenic fever. Patient was taking Coumadin 5mg  daily prior to admission. INR on admission was 2.33. Today INR 1.99   Goal of Therapy:  INR 2-3 Monitor platelets by anticoagulation protocol: Yes   Plan:  Will continue with home dose of Coumadin 5mg  daily and check daily INRs. Will adjust dose based on INR results.  Pharmacy will continue to monitor labs.   Ramond Dial, Pharm.D Clinical Pharmacist

## 2014-11-25 NOTE — Progress Notes (Addendum)
[facility] [place] NAME: Emily Zamora    MR#:  [id number]  DATE OF BIRTH:  [birth year]  SUBJECTIVE:  CHIEF COMPLAINT:   Chief Complaint  Patient presents with  . Weakness     Has low-grade fever at 100.2   Resting comfortably. Denies any complaints. Received platelet transfusion  11/23/2014. no active bleed.  son is at bedside, concerned about patient's low white count.  REVIEW OF SYSTEMS:   CONSTITUTIONAL: negative  for fever in last 36 hrs, positive for fatigue or weakness.  EYES: No blurred or double vision.  EARS, NOSE, AND THROAT: No tinnitus or ear pain.  RESPIRATORY: No cough, shortness of breath, wheezing or hemoptysis.  CARDIOVASCULAR: No chest pain, orthopnea, edema.  GASTROINTESTINAL: No nausea, vomiting, positive for diarrhea but no abdominal pain.  GENITOURINARY: No dysuria, hematuria.  ENDOCRINE: No polyuria, nocturia,  HEMATOLOGY: No anemia, easy bruising or bleeding SKIN: No rash or lesion. MUSCULOSKELETAL: No joint pain or arthritis.  NEUROLOGIC: No tingling, numbness, have generalized weakness due to underlying cancer.  PSYCHIATRY: No anxiety or depression.  ROS  DRUG ALLERGIES:   Allergies  Allergen Reactions  . Sulfa Antibiotics Anaphylaxis    VITALS:  Blood pressure 108/71, pulse 109, temperature 100.2 F (37.9 C), temperature source Oral, resp. rate 16, height 5\' 4"  (1.626 m), weight 71.986 kg (158 lb 11.2 oz), SpO2 99 %.  PHYSICAL EXAMINATION:   GENERAL:59 y.o.-year-old patient lying in the bed with no acute distress.  EYES: Pupils equal, round, reactive to light and accommodation. No scleral icterus. Extraocular muscles intact.  HEENT: Head atraumatic, normocephalic. Oropharynx and nasopharynx clear.  NECK: Supple, no jugular venous distention. No thyroid enlargement, no tenderness.  LUNGS: Normal breath sounds bilaterally, no wheezing, rales,rhonchi or crepitation. No use of  accessory muscles of respiration.  CARDIOVASCULAR: S1, S2 , tachycardic. No murmurs, rubs, or gallops.  ABDOMEN: Soft, nontender, nondistended. Bowel sounds present. No organomegaly or mass.  EXTREMITIES: No pedal edema, cyanosis, or clubbing.  NEUROLOGIC: Cranial nerves II through XII are intact. Muscle strength 5/5 in upper extremities 4/5 lower. Sensation intact. Gait not checked.  PSYCHIATRIC: The patient is alert and oriented x 3.  SKIN: No obvious rash, lesion, or ulcer.   Physical Exam LABORATORY PANEL:   CBC  Recent Labs Lab 11/25/14 0557  WBC 0.5*  HGB 8.7*  HCT 26.1*  PLT 44*   ------------------------------------------------------------------------------------------------------------------  Chemistries   Recent Labs Lab 11/23/14 0501  NA 135  K 3.8  CL 107  CO2 22  GLUCOSE 79  BUN 28*  CREATININE 0.84  CALCIUM 7.0*   ------------------------------------------------------------------------------------------------------------------  Cardiac Enzymes No results for input(s): TROPONINI in the last 168 hours. ------------------------------------------------------------------------------------------------------------------  RADIOLOGY:  Dg Chest 2 View  11/25/2014  CLINICAL DATA:  Uterine cancer with lung metastases EXAM: CHEST  2 VIEW COMPARISON:  None. FINDINGS: There is a right chest wall port a catheter with tip in the projection of the SVC. The heart size and mediastinal contours are within normal limits. Both lungs are clear. The visualized skeletal structures are unremarkable. IMPRESSION: No active cardiopulmonary disease. Electronically Signed   By: Kerby Moors M.D.   On: 11/25/2014 14:34    ASSESSMENT AND PLAN:   Principal Problem:   Neutropenic fever ([place]) Active Problems:   Diarrhea  * Sepsis secondary to acute cystitis  and Neutropenic fever  patient has temp 100.2 today Blood culture negative so far, Will repeat blood cultures 2  again  Will  get chest x-ray 2 views  stool for culture- collected and C. difficile negative.   resumeFortaz which was discontinued  As patient is febrile appreciated Oncology consult. Dr,Brahmandi  Will  discuss with the son regarding his concerns about chemotherapy from bone scan and  fever   On  Granix  for neutropenia.  *Acute cystitis with Escherichia coli pansensitive Continue  Fortaz  * Thrombocytopenia-better today with platelet count of 44,000  Status post platelet transfusion as recommended by Oncology 11/23/2014   No active bleeding.  * Sinus tachycardia could be from problem #1 with sepsis  better with IV fluids This may be as a result of fever and mild dehydration due to diarrhea She is on atenolol at home will continue that.     * History of DVT She is on Coumadin will continue that. INR at 2.09 today Pharmacy to help manage the dose.  * Endometrial cancer with metastasis She is on chemotherapy , oncology for further management.  * Hypertension We'll continue atenolol.  * Hyperkalemia  Resolved with Oral kayexalate.  All the records are reviewed and case discussed with Care Management/Social Workerr. Management plans discussed with the patient, family and they are in agreement.  CODE STATUS: full  TOTAL TIME TAKING CARE OF THIS PATIENT: 35 minutes.     POSSIBLE D/C IN 1-2 DAYS, DEPENDING ON CLINICAL CONDITION.   Nicholes Mango M.D on 11/25/2014   Between 7am to 6pm - Pager - [phone]  After 6pm go to [email or web address] - password [email or web address] [facility]  Office  [phone]  CC: Primary care physician; Ashok Norris, MD  Note: This dictation was prepared with Dragon dictation along with smaller phrase technology. Any transcriptional errors that result from this process are unintentional.

## 2014-11-26 ENCOUNTER — Ambulatory Visit: Payer: No Typology Code available for payment source | Admitting: Radiation Oncology

## 2014-11-26 DIAGNOSIS — M545 Low back pain: Secondary | ICD-10-CM

## 2014-11-26 LAB — CULTURE, BLOOD (ROUTINE X 2)
CULTURE: NO GROWTH
Culture: NO GROWTH

## 2014-11-26 LAB — CBC
HCT: 24.3 % — ABNORMAL LOW (ref 35.0–47.0)
Hemoglobin: 8 g/dL — ABNORMAL LOW (ref 12.0–16.0)
MCH: 28.4 pg (ref 26.0–34.0)
MCHC: 33 g/dL (ref 32.0–36.0)
MCV: 86.1 fL (ref 80.0–100.0)
PLATELETS: 42 10*3/uL — AB (ref 150–440)
RBC: 2.83 MIL/uL — AB (ref 3.80–5.20)
RDW: 16 % — ABNORMAL HIGH (ref 11.5–14.5)
WBC: 0.6 10*3/uL — CL (ref 3.6–11.0)

## 2014-11-26 LAB — PROTIME-INR
INR: 1.76
PROTHROMBIN TIME: 20.5 s — AB (ref 11.4–15.0)

## 2014-11-26 MED ORDER — WARFARIN SODIUM 5 MG PO TABS
6.0000 mg | ORAL_TABLET | Freq: Once | ORAL | Status: AC
  Administered 2014-11-26: 6 mg via ORAL
  Filled 2014-11-26: qty 1

## 2014-11-26 NOTE — Progress Notes (Signed)
Emily Zamora   DOB:[birth year]   [id number]    CC: Neutropenic fever/persistent neutropenia  Subjective:  Patient resting in the bed; denies any fevers overnight. Continues to feel weak. She is sitting up in a chair few Hours a day. She is unable to get up walk in the hallways because of back pain.  Patient's son had multiple questions regarding- her plan of care; while the concerns being separate blood loss for PT INR/length of stay.  ROS:  No headaches; no nausea vomiting diarrhea. Objective:  Filed Vitals:   11/26/14 0606 11/26/14 0746  BP: 110/69   Pulse: 106 93  Temp: 97.9 F (36.6 C)   Resp:       Intake/Output Summary (Last 24 hours) at 11/26/14 1346 Last data filed at 11/25/14 2214  Gross per 24 hour  Intake      0 ml  Output   1725 ml  Net  -1725 ml    GENERAL: Well-nourished well-developed; Alert, no distress and comfortable. She is accompanied by her son by the bedside. She appears weak overall.  EYES: no pallor or icterus OROPHARYNX: no thrush or ulceration; good dentition  NECK: supple, no masses felt LYMPH: no palpable lymphadenopathy in the cervical, axillary or inguinal regions LUNGS: clear to auscultation and No wheeze or crackles HEART/CVS: regular rate & rhythm and no murmurs; No lower extremity edema ABDOMEN: abdomen soft, non-tender and normal bowel sounds Musculoskeletal:no cyanosis of digits and no clubbing  PSYCH: alert & oriented x 3 with fluent speech NEURO: no focal motor/sensory deficits SKIN: no rashes or significant lesions   Labs:  Lab Results  Component Value Date   WBC 0.6* 11/26/2014   HGB 8.0* 11/26/2014   HCT 24.3* 11/26/2014   MCV 86.1 11/26/2014   PLT 42* 11/26/2014   NEUTROABS 1.2* 11/21/2014    Lab Results  Component Value Date   NA 135 11/23/2014   K 3.8 11/23/2014   CL 107 11/23/2014   CO2 22 11/23/2014    Studies:  Dg Chest 2 View  11/25/2014  CLINICAL DATA:  Uterine cancer with lung metastases EXAM:  CHEST  2 VIEW COMPARISON:  None. FINDINGS: There is a right chest wall port a catheter with tip in the projection of the SVC. The heart size and mediastinal contours are within normal limits. Both lungs are clear. The visualized skeletal structures are unremarkable. IMPRESSION: No active cardiopulmonary disease. Electronically Signed   By: Kerby Moors M.D.   On: 11/25/2014 14:34    Assessment & Plan:   59 year old female patient with a history of recurrent /metastatic uterine cancer currently on chemotherapy with cisplatin and docetaxel is currently admitted to the hospital for neutropenic fever.   # Neutropenic fever- source likely UTI- urine culture positive for Escherichia coli. Blood cultures negative Clinically improving. However white count still 0.5. Neupogen day #5 today;  Repeat blood cultures are pending.   # Severe neutropenia from chemotherapy- white count still not improving/around 0.5-0.6 given after 4 days of Neupogen. I think it should start coming up inThe next few days. Continue Neupogen for now.  # History of PE on Coumadin/INR 2.3/thrombus cytopenia status post transfusion of platelet- platelets around 40. Given the concerns for a separate blood draw/ I recommend trying the blood for PT INR from the [facility]. Discussed with the nurse.  # Uterine cancer- currently on cisplatin and docetaxel cycle 1 # 10.  The above plan of care was discussed with the patient's son/patient detail. Also discussed with Dr.  Patel. Dr. Mike Gip would be available for rest of the week.   Cammie Sickle, MD 11/26/2014  1:46 PM

## 2014-11-26 NOTE — Plan of Care (Signed)
Problem: Safety: Goal: Ability to remain free from injury will improve Outcome: Progressing Pt is a high fall risk, calls for assistance. Unsteady gait, 1 assist to the bathroom. Son remains at bedside, supportive.  Problem: Pain Managment: Goal: General experience of comfort will improve Outcome: Progressing C/o left hip pain, oxycodone given with relief.  Problem: Fluid Volume: Goal: Ability to maintain a balanced intake and output will improve Outcome: Progressing Continues to receive NS at 75 ml/hr. PO intake encouraged.

## 2014-11-26 NOTE — Plan of Care (Signed)
Problem: Safety: Goal: Ability to remain free from injury will improve Outcome: Progressing No falls. Patient calls out for assistance.  Problem: Pain Managment: Goal: General experience of comfort will improve Outcome: Progressing Patient c/o pain in left hip, oxycodone given PRN. Patient stated relief.  Problem: Physical Regulation: Goal: Will remain free from infection Outcome: Progressing Remains on protective precautions for WBC 0.6. Grannix given this AM. Afebrile thus far. IV ABX infusing.  Problem: Tissue Perfusion: Goal: Risk factors for ineffective tissue perfusion will decrease Outcome: Progressing Patient on coumadin  Problem: Fluid Volume: Goal: Ability to maintain a balanced intake and output will improve Outcome: Progressing IVF infusing.  Problem: Nutrition: Goal: Adequate nutrition will be maintained Outcome: Progressing Nutrition consult done.

## 2014-11-26 NOTE — Progress Notes (Signed)
Initial Nutrition Assessment  INTERVENTION:   Meals and Snacks: Cater to patient preferences; will send bedtime snack as pt prefers eating later in the day. RD clarified pt is tolerating metal utensils at this time (reports only needing plastic utensils for 2-3 days after chemotherapy). Medical Food Supplement Therapy: will send Carnation Instant Breakfast as 3pm snack for added nutrition; each supplement 250kcals and 13g protein   NUTRITION DIAGNOSIS:   Inadequate oral intake related to cancer and cancer related treatments as evidenced by per patient/family report.  GOAL:   Patient will meet greater than or equal to 90% of their needs  MONITOR:    (Energy Intake, Electrolyte and Renal Profile, Digestive System)  REASON FOR ASSESSMENT:   LOS    ASSESSMENT:   Pt admitted with weakness and dehydration s/p chemotherapy. Pt with h/o endometrial cancer with mets; 2nd round of chemotherapy last 4 days PTA.  Past Medical History  Diagnosis Date  . Cancer ([place])   . Primary cancer of endometrium ([place]) 05/24/2014  . Hypertension   . Knee pain, bilateral   . Arthritis   . Renal insufficiency     Diet Order:  Diet regular Room service appropriate?: Yes; Fluid consistency:: Thin    Current Nutrition: Pt reports eating half of egg biscuit this am and then feeling full. Pt reports often having early satiety as she has not been eating very much recently. Pt son reports getting her meals from the community such as blueberry pancakes from [employer] last night that she ate last night for dinner.   Food/Nutrition-Related History: Pt reports poor appetite the first few days of admission but that it it is slowly improving. Pt reports since chemotherapy some swallowing difficulty and has tolerated soft foods better. Pt reports PTA usually not eating breakfast, but would eat lunch and dinner and a bedtime snack. Pt reports tolerating foods better later in the day. Pt also reports drinking [employer] a long time ago but no supplement drinks recently. Pt reports son sometimes brings protein powder with smoothies/shakes that she will drink.   Scheduled Medications:  . aspirin  325 mg Oral Daily  . atenolol  100 mg Oral Daily  . cefTAZidime (FORTAZ)  IV  2 g Intravenous 3 times per day  . citalopram  40 mg Oral Daily  . famotidine  20 mg Oral BID  . fentaNYL  75 mcg Transdermal Q72H  . loratadine  10 mg Oral Daily  . mirtazapine  15 mg Oral QHS  . multivitamin with minerals  1 tablet Oral Daily  . senna-docusate  1 tablet Oral BID  . sucralfate  1 g Oral TID WC & HS  . Tbo-Filgrastim  480 mcg Subcutaneous Daily  . warfarin  6 mg Oral ONCE-1800  . Warfarin - Pharmacist Dosing Inpatient   Does not apply q1800    Continuous Medications:  . sodium chloride 75 mL/hr at 11/26/14 0605     Electrolyte/Renal Profile and Glucose Profile:   Recent Labs Lab 11/21/14 1053 11/22/14 0335 11/23/14 0501  NA 128* 134* 135  K 5.1 5.6* 3.8  CL 97* 106 107  CO2 20* 20* 22  BUN 33* 28* 28*  CREATININE 1.18* 0.95 0.84  CALCIUM 7.4* 7.7* 7.0*  GLUCOSE 139* 143* 79   Protein Profile: No results for input(s): ALBUMIN in the last 168 hours.  Gastrointestinal Profile: Last BM:   Nutrition-Focused Physical Exam Findings:  Unable to complete Nutrition-Focused physical exam at this time.    Weight  Change: Pt reports weight loss of about 10lbs in 2 weeks (11% weight loss)  Height:   Ht Readings from Last 1 Encounters:  11/21/14 5\' 4"  (1.626 m)    Weight:   Wt Readings from Last 1 Encounters:  11/21/14 158 lb 11.2 oz (71.986 kg)    Wt Readings from Last 10 Encounters:  11/21/14 158 lb 11.2 oz (71.986 kg)  11/12/14 166 lb 5.4 oz (75.45 kg)  11/10/14 165 lb (74.844 kg)  10/20/14 175 lb 14.8 oz (79.8 kg)  10/15/14 175 lb 11.3 oz (79.7 kg)  10/12/14 176 lb (79.833 kg)  10/11/14 176 lb (79.833 kg)  10/08/14 177 lb 14.6 oz (80.7 kg)  09/17/14 176 lb 5.9 oz (80  kg)  09/02/14 177 lb 12.8 oz (80.65 kg)    BMI:  Body mass index is 27.23 kg/(m^2).  Estimated Nutritional Needs:   Kcal:  BEE: 1269kcals, TEE: (IF 1.1-1.3)(AF 1.2) 1675-1979kcals  Protein:  72-87g protein (1.0-1.2g/kg)   Fluid:  1780-2172mL of fluid (25-34mL/kg)  EDUCATION NEEDS:   No education needs identified at this time   [place], RD, LDN Pager [phone]

## 2014-11-26 NOTE — Progress Notes (Signed)
ANTICOAGULATION CONSULT NOTE - Follow UP  Pharmacy Consult for Coumadin Indication: History of DVT  Allergies  Allergen Reactions  . Sulfa Antibiotics Anaphylaxis    Patient Measurements: Height: 5\' 4"  (162.6 cm) Weight: 158 lb 11.2 oz (71.986 kg) IBW/kg (Calculated) : 54.7  Vital Signs: Temp: 97.9 F (36.6 C) (11/23 0606) Temp Source: Oral (11/23 0606) BP: 110/69 mmHg (11/23 0606) Pulse Rate: 106 (11/23 0606)  Labs:  Recent Labs  11/24/14 0519 11/25/14 0557 11/26/14 0705  HGB 10.2* 8.7* 8.0*  HCT 30.7* 26.1* 24.3*  PLT 56* 44* 42*  LABPROT 23.3* 22.5* 20.5*  INR 2.09 1.99 1.76    Medical History: Past Medical History  Diagnosis Date  . Cancer ([place])   . Primary cancer of endometrium ([place]) 05/24/2014  . Hypertension   . Knee pain, bilateral   . Arthritis   . Renal insufficiency    Assessment: Patient is a 59yo female currently undergoing chemotherapy for endometrial carcinoma with metastasis. Patient also has a history of DVT for which she was taking warfarin prior to admission. Patient's home regimen is warfarin 5 mg PO daily.   INR today is subtherapeutic at 1.76  Dosing history: Date INR Dose 11/18 2.33 5 mg 11/19 2.29 5 mg 11/20 2.78 3 mg 11/21 2.09 5 mg 11/22 1.99 5 mg  Goal of Therapy:  INR 2-3 Monitor platelets by anticoagulation protocol: Yes   Plan:  INR today is subtherapeutic, most likely as a reflection of the 3 mg dose that was given 3 days ago.  Will give warfarin 6 mg dose once this evening and anticipate possibly being able to resume home regimen tomorrow. INR ordered for tomorrow with AM labs.  Pharmacy will continue to monitor and adjust dose as needed.   Darylene Price [place] 12:03 PM 11/26/14

## 2014-11-26 NOTE — Progress Notes (Signed)
Patient ID: RAYGAN DEDON, female   DOB: 05/29/1955, 59 y.o.   MRN: [id number] [place] [place] [facility] [place] NAME: Emily Zamora    MR#:  [id number]  DATE OF BIRTH:  06-18-1955  SUBJECTIVE:   I am having a better day today REVIEW OF SYSTEMS:   Review of Systems  Constitutional: Negative for fever, chills and weight loss.  HENT: Negative for ear discharge, ear pain and nosebleeds.   Eyes: Negative for blurred vision, pain and discharge.  Respiratory: Negative for sputum production, shortness of breath, wheezing and stridor.   Cardiovascular: Negative for chest pain, palpitations, orthopnea and PND.  Gastrointestinal: Negative for nausea, vomiting, abdominal pain and diarrhea.  Genitourinary: Negative for urgency and frequency.  Musculoskeletal: Positive for back pain. Negative for joint pain.  Neurological: Positive for weakness. Negative for sensory change, speech change and focal weakness.  Psychiatric/Behavioral: Negative for depression and hallucinations. The patient is not nervous/anxious.   All other systems reviewed and are negative.  Tolerating Diet:yes Tolerating PT: yes  DRUG ALLERGIES:   Allergies  Allergen Reactions  . Sulfa Antibiotics Anaphylaxis    VITALS:  Blood pressure 119/76, pulse 114, temperature 98.4 F (36.9 C), temperature source Oral, resp. rate 18, height 5\' 4"  (1.626 m), weight 71.986 kg (158 lb 11.2 oz), SpO2 100 %.  PHYSICAL EXAMINATION:   Physical Exam  GENERAL:  59 y.o.-year-old patient lying in the bed with no acute distress.  EYES: Pupils equal, round, reactive to light and accommodation. No scleral icterus. Extraocular muscles intact.  HEENT: Head atraumatic, normocephalic. Oropharynx and nasopharynx clear.  NECK:  Supple, no jugular venous distention. No thyroid enlargement, no tenderness.  LUNGS: Normal breath sounds bilaterally, no wheezing, rales, rhonchi. No use of accessory muscles of respiration.  Port + CARDIOVASCULAR: S1, S2 normal. No murmurs, rubs, or gallops.  ABDOMEN: Soft, nontender, nondistended. Bowel sounds present. No organomegaly or mass.  EXTREMITIES: No cyanosis, clubbing or edema b/l.    NEUROLOGIC: Cranial nerves II through XII are intact. No focal Motor or sensory deficits b/l.   PSYCHIATRIC: The patient is alert and oriented x 3.  SKIN: No obvious rash, lesion, or ulcer.    LABORATORY PANEL:   CBC  Recent Labs Lab 11/26/14 0705  WBC 0.6*  HGB 8.0*  HCT 24.3*  PLT 42*    Chemistries   Recent Labs Lab 11/23/14 0501  NA 135  K 3.8  CL 107  CO2 22  GLUCOSE 79  BUN 28*  CREATININE 0.84  CALCIUM 7.0*    Cardiac Enzymes No results for input(s): TROPONINI in the last 168 hours.  RADIOLOGY:  Dg Chest 2 View  11/25/2014  CLINICAL DATA:  Uterine cancer with lung metastases EXAM: CHEST  2 VIEW COMPARISON:  None. FINDINGS: There is a right chest wall port a catheter with tip in the projection of the SVC. The heart size and mediastinal contours are within normal limits. Both lungs are clear. The visualized skeletal structures are unremarkable. IMPRESSION: No active cardiopulmonary disease. Electronically Signed   By: Kerby Moors M.D.   On: 11/25/2014 14:34     ASSESSMENT AND PLAN:  * Sepsis secondary to acute cystitis and Neutropenic fevertoday Blood culture negative x4 so far UC Ecoli stool for culture- negative and C. difficile negative.  IV Tressie Ellis continued appreciated Oncology consult  *Neutropenia due to chemo  OnIV Granix for neutropenia. Day 5 -0.5-->0.5-->0.6  * Thrombocytopenia-better today with platelet count of 44,000 Status post  platelet transfusion as recommended by Oncology 11/23/2014  No active bleeding.  * Sinus tachycardia could be from problem #1 with sepsis This may be as a result of fever and mild dehydration due to diarrhea She is on atenolol at home will continue that.   * History of DVT She is on  Coumadin will continue that. INR at 2.09 today Pharmacy to help manage the dose.  * Endometrial cancer with metastasis She is on chemotherapy , oncology for further management.  * Hypertension continue atenolol.  * Hyperkalemia Resolved with Oral kayexalate.   Case discussed with Care Management/Social Worker. Management plans discussed with the patient, family and they are in agreement.  CODE STATUS: full  DVT Prophylaxis: on coumadin  TOTAL TIME TAKING CARE OF THIS PATIENT: 30 minutes.  >50% time spent on counselling and coordination of care pt ns son  POSSIBLE D/C IN 1-2 DAYS, DEPENDING ON CLINICAL CONDITION.   Emily Zamora M.D on 11/26/2014 at 3:54 PM  Between 7am to 6pm - Pager - [phone]  After 6pm go to [email or web address] - password [email or web address] [facility]  Office  [phone]  CC: Primary care physician; Ashok Norris, MD

## 2014-11-27 DIAGNOSIS — N390 Urinary tract infection, site not specified: Secondary | ICD-10-CM

## 2014-11-27 DIAGNOSIS — B9620 Unspecified Escherichia coli [E. coli] as the cause of diseases classified elsewhere: Secondary | ICD-10-CM

## 2014-11-27 DIAGNOSIS — D709 Neutropenia, unspecified: Secondary | ICD-10-CM

## 2014-11-27 DIAGNOSIS — Z7982 Long term (current) use of aspirin: Secondary | ICD-10-CM

## 2014-11-27 DIAGNOSIS — Z86711 Personal history of pulmonary embolism: Secondary | ICD-10-CM

## 2014-11-27 DIAGNOSIS — D509 Iron deficiency anemia, unspecified: Secondary | ICD-10-CM

## 2014-11-27 DIAGNOSIS — Z7901 Long term (current) use of anticoagulants: Secondary | ICD-10-CM

## 2014-11-27 DIAGNOSIS — C641 Malignant neoplasm of right kidney, except renal pelvis: Secondary | ICD-10-CM

## 2014-11-27 DIAGNOSIS — Z79899 Other long term (current) drug therapy: Secondary | ICD-10-CM

## 2014-11-27 DIAGNOSIS — D696 Thrombocytopenia, unspecified: Secondary | ICD-10-CM

## 2014-11-27 LAB — CBC WITH DIFFERENTIAL/PLATELET
BASOS ABS: 0 10*3/uL (ref 0–0.1)
Eosinophils Absolute: 0 10*3/uL (ref 0–0.7)
HCT: 22.7 % — ABNORMAL LOW (ref 35.0–47.0)
Hemoglobin: 7.5 g/dL — ABNORMAL LOW (ref 12.0–16.0)
Lymphs Abs: 0.3 10*3/uL — ABNORMAL LOW (ref 1.0–3.6)
MCH: 28.3 pg (ref 26.0–34.0)
MCHC: 33.1 g/dL (ref 32.0–36.0)
MCV: 85.3 fL (ref 80.0–100.0)
MONO ABS: 0.1 10*3/uL — AB (ref 0.2–0.9)
Neutro Abs: 0.9 10*3/uL — ABNORMAL LOW (ref 1.4–6.5)
Neutrophils Relative %: 66 %
PLATELETS: 38 10*3/uL — AB (ref 150–440)
RBC: 2.66 MIL/uL — ABNORMAL LOW (ref 3.80–5.20)
RDW: 15.7 % — AB (ref 11.5–14.5)
WBC: 1.4 10*3/uL — CL (ref 3.6–11.0)

## 2014-11-27 LAB — RETICULOCYTES
RBC.: 2.68 MIL/uL — ABNORMAL LOW (ref 3.80–5.20)
Retic Count, Absolute: 50.9 10*3/uL (ref 19.0–183.0)
Retic Ct Pct: 1.9 % (ref 0.4–3.1)

## 2014-11-27 LAB — PROTIME-INR
INR: 1.74
Prothrombin Time: 20.3 seconds — ABNORMAL HIGH (ref 11.4–15.0)

## 2014-11-27 MED ORDER — WARFARIN SODIUM 5 MG PO TABS
5.0000 mg | ORAL_TABLET | Freq: Every day | ORAL | Status: DC
  Administered 2014-11-27: 5 mg via ORAL
  Filled 2014-11-27: qty 1

## 2014-11-27 NOTE — Plan of Care (Signed)
Problem: Education: Goal: Knowledge of [facility] Education information/materials will improve Outcome: Progressing Pt had a fever of 100.9. Tylenol given with relief.  Problem: Safety: Goal: Ability to remain free from injury will improve Outcome: Progressing Pt is a high fall risk, calls when assistance is needed to the bathroom. Standby assist, unsteady gait.  Problem: Pain Managment: Goal: General experience of comfort will improve Outcome: Progressing C/o L hip pain, oxycodone given x1 with improvement.  Problem: Fluid Volume: Goal: Ability to maintain a balanced intake and output will improve Outcome: Progressing Pt continues to receive NS at 75 ml/hr and antibiotics.

## 2014-11-27 NOTE — Plan of Care (Signed)
Problem: Safety: Goal: Ability to remain free from injury will improve Outcome: Progressing High fall risk. Calls out with needs.  Problem: Pain Managment: Goal: General experience of comfort will improve Outcome: Progressing Oxycodone & flexeril given PRN. Patient stated relief.  Problem: Physical Regulation: Goal: Will remain free from infection Outcome: Progressing Patient sat up in chair this afternoon.  Problem: Tissue Perfusion: Goal: Risk factors for ineffective tissue perfusion will decrease Outcome: Progressing On coumadin. Patient moving in bed.  Problem: Fluid Volume: Goal: Ability to maintain a balanced intake and output will improve Outcome: Progressing IVF stopped today. IV ABX infusing still. Afebrile. WBC up to 1.4, grannix given daily.  Problem: Nutrition: Goal: Adequate nutrition will be maintained Outcome: Progressing Patient eating well. Magic mouth wash given PRN before eating.

## 2014-11-27 NOTE — Progress Notes (Signed)
Patient ID: Emily Zamora, female   DOB: 01-24-1955, 59 y.o.   MRN: [id number] [facility] NAME: Emily Zamora    MR#:  [id number]  DATE OF BIRTH:  1955-06-29  SUBJECTIVE:   I am having a better day today. Low grade fever y'day REVIEW OF SYSTEMS:   Review of Systems  Constitutional: Negative for fever, chills and weight loss.  HENT: Negative for ear discharge, ear pain and nosebleeds.   Eyes: Negative for blurred vision, pain and discharge.  Respiratory: Negative for sputum production, shortness of breath, wheezing and stridor.   Cardiovascular: Negative for chest pain, palpitations, orthopnea and PND.  Gastrointestinal: Negative for nausea, vomiting, abdominal pain and diarrhea.  Genitourinary: Negative for urgency and frequency.  Musculoskeletal: Positive for back pain. Negative for joint pain.  Neurological: Positive for weakness. Negative for sensory change, speech change and focal weakness.  Psychiatric/Behavioral: Negative for depression and hallucinations. The patient is not nervous/anxious.   All other systems reviewed and are negative.  Tolerating Diet:yes Tolerating PT: yes  DRUG ALLERGIES:   Allergies  Allergen Reactions  . Sulfa Antibiotics Anaphylaxis    VITALS:  Blood pressure 109/78, pulse 88, temperature 98.7 F (37.1 C), temperature source Oral, resp. rate 18, height 5\' 4"  (1.626 m), weight 71.986 kg (158 lb 11.2 oz), SpO2 95 %.  PHYSICAL EXAMINATION:   Physical Exam  GENERAL:  59 y.o.-year-old patient lying in the bed with no acute distress.  EYES: Pupils equal, round, reactive to light and accommodation. No scleral icterus. Extraocular muscles intact.  HEENT: Head atraumatic, normocephalic. Oropharynx and nasopharynx clear.  NECK:  Supple, no jugular venous distention. No thyroid enlargement, no tenderness.  LUNGS: Normal breath sounds bilaterally, no wheezing, rales, rhonchi. No use of accessory  muscles of respiration. Port + CARDIOVASCULAR: S1, S2 normal. No murmurs, rubs, or gallops.  ABDOMEN: Soft, nontender, nondistended. Bowel sounds present. No organomegaly or mass.  EXTREMITIES: No cyanosis, clubbing or edema b/l.    NEUROLOGIC: Cranial nerves II through XII are intact. No focal Motor or sensory deficits b/l.   PSYCHIATRIC: The patient is alert and oriented x 3.  SKIN: No obvious rash, lesion, or ulcer.    LABORATORY PANEL:   CBC  Recent Labs Lab 11/27/14 0615  WBC 1.4*  HGB 7.5*  HCT 22.7*  PLT 38*    Chemistries   Recent Labs Lab 11/23/14 0501  NA 135  K 3.8  CL 107  CO2 22  GLUCOSE 79  BUN 28*  CREATININE 0.84  CALCIUM 7.0*    Cardiac Enzymes No results for input(s): TROPONINI in the last 168 hours.  RADIOLOGY:  Dg Chest 2 View  11/25/2014  CLINICAL DATA:  Uterine cancer with lung metastases EXAM: CHEST  2 VIEW COMPARISON:  None. FINDINGS: There is a right chest wall port a catheter with tip in the projection of the SVC. The heart size and mediastinal contours are within normal limits. Both lungs are clear. The visualized skeletal structures are unremarkable. IMPRESSION: No active cardiopulmonary disease. Electronically Signed   By: Kerby Moors M.D.   On: 11/25/2014 14:34     ASSESSMENT AND PLAN:  * Sepsis secondary to acute cystitis and Neutropenic fevertoday Blood culture negative x4 so far UC Ecoli stool for culture- negative and C. difficile negative.  IV Tressie Ellis continued appreciated Oncology consult  *Neutropenia due to chemo  OnIV Granix for neutropenia. Day 6 -0.5-->0.5-->0.6-->1.4  * Thrombocytopenia-better today with platelet count  of 44,000 Status post platelet transfusion as recommended by Oncology 11/23/2014  No active bleeding.  * Sinus tachycardia could be from problem #1 with sepsis on atenolol   * History of DVT She is on Coumadin will continue that. INR at 2.09 today Pharmacy to help manage the  dose.  * Endometrial cancer with metastasis She is on chemotherapy , oncology for further management.  * Hypertension continue atenolol.  * Hyperkalemia Resolved with Oral kayexalate.   Case discussed with Care Management/Social Worker. Management plans discussed with the patient, family and they are in agreement.  CODE STATUS: full  DVT Prophylaxis: on coumadin  TOTAL TIME TAKING CARE OF THIS PATIENT: 30 minutes.  >50% time spent on counselling and coordination of care pt ns son  POSSIBLE D/C IN 1-2 DAYS, DEPENDING ON CLINICAL CONDITION.   Simuel Stebner M.D on 11/27/2014 at 12:17 PM  Between 7am to 6pm - Pager - [phone]  After 6pm go to [email or web address] - password [email or web address] [facility]  Office  [phone]  CC: Primary care physician; Ashok Norris, MD

## 2014-11-27 NOTE — Progress Notes (Signed)
ANTICOAGULATION CONSULT NOTE - Follow UP  Pharmacy Consult for Coumadin Indication: History of DVT  Allergies  Allergen Reactions  . Sulfa Antibiotics Anaphylaxis    Patient Measurements: Height: 5\' 4"  (162.6 cm) Weight: 158 lb 11.2 oz (71.986 kg) IBW/kg (Calculated) : 54.7  Vital Signs: Temp: 98.7 F (37.1 C) (11/24 0543) Temp Source: Oral (11/24 0543) BP: 109/78 mmHg (11/24 0543) Pulse Rate: 88 (11/24 0543)  Labs:  Recent Labs  11/25/14 0557 11/26/14 0705 11/27/14 0615  HGB 8.7* 8.0* 7.5*  HCT 26.1* 24.3* 22.7*  PLT 44* 42* 38*  LABPROT 22.5* 20.5* 20.3*  INR 1.99 1.76 1.74    Medical History: Past Medical History  Diagnosis Date  . Cancer ([place])   . Primary cancer of endometrium ([place]) 05/24/2014  . Hypertension   . Knee pain, bilateral   . Arthritis   . Renal insufficiency    Assessment: Patient is a 59yo female currently undergoing chemotherapy for endometrial carcinoma with metastasis. Patient also has a history of DVT for which she was taking warfarin prior to admission. Patient's home regimen is warfarin 5 mg PO daily.   INR today is subtherapeutic at 1.76  Dosing history: Date INR Dose 11/18 2.33 5 mg 11/19 2.29 5 mg 11/20 2.78 3 mg 11/21 2.09 5 mg 11/22 1.99 5 mg  11/23 1.76 6mg   11/14 1.74    Goal of Therapy:  INR 2-3 Monitor platelets by anticoagulation protocol: Yes   Plan:  INR today is subtherapeutic, patient received warfarin 6mg  last night. Will resume home regimen on 5mg  daily today. INR ordered for tomorrow with AM labs.  Pharmacy will continue to monitor and adjust dose as needed.   Rexene Edison, PharmD Clinical Pharmacist  8:42 AM 11/26/14

## 2014-11-27 NOTE — Progress Notes (Signed)
[facility] Hematology/Oncology Progress Note  Date of admission: 11/21/2014  Hospital day:  11/27/2014  Chief Complaint: Emily Zamora is a 59 y.o. female with endometrial cancer who was admitted with fever and neutropenia.  Subjective: Feeling better.  Social History: The patient is accompanied by her son, Emily Zamora, today.  Allergies:  Allergies  Allergen Reactions  . Sulfa Antibiotics Anaphylaxis    Scheduled Medications: . aspirin  325 mg Oral Daily  . atenolol  100 mg Oral Daily  . cefTAZidime (FORTAZ)  IV  2 g Intravenous 3 times per day  . citalopram  40 mg Oral Daily  . famotidine  20 mg Oral BID  . fentaNYL  75 mcg Transdermal Q72H  . loratadine  10 mg Oral Daily  . mirtazapine  15 mg Oral QHS  . multivitamin with minerals  1 tablet Oral Daily  . senna-docusate  1 tablet Oral BID  . sucralfate  1 g Oral TID WC & HS  . Tbo-Filgrastim  480 mcg Subcutaneous Daily  . warfarin  5 mg Oral q1800  . Warfarin - Pharmacist Dosing Inpatient   Does not apply q1800    Review of Systems: GENERAL:  Fatigue.  T max 24 hours 100.9.  No sweats or weight loss. PERFORMANCE STATUS (ECOG):  2 HEENT:  No visual changes, runny nose, sore throat, mouth sores or tenderness. Lungs: No shortness of breath or cough.  No hemoptysis. Cardiac:  No chest pain, palpitations, orthopnea, or PND. GI:  No nausea, vomiting, diarrhea, constipation, melena or hematochezia. GU:  No urgency, frequency, dysuria, or hematuria. Musculoskeletal:  Back pain.  No joint pain.  No muscle tenderness. Extremities:  No pain or swelling. Skin:  No rashes or skin changes. Neuro:  No headache, numbness or weakness, balance or coordination issues. Endocrine:  No diabetes, thyroid issues, hot flashes or night sweats. Psych:  No mood changes, depression or anxiety. Pain:  No focal pain. Review of systems:  All other systems reviewed and found to be negative.  Physical Exam: Blood pressure  113/77, pulse 100, temperature 100 F (37.8 C), temperature source Oral, resp. rate 16, height 5\' 4"  (1.626 m), weight 158 lb 11.2 oz (71.986 kg), SpO2 95 %.  GENERAL:  Well developed, well nourished, sitting comfortably on the medical unit in no acute distress. MENTAL STATUS:  Alert and oriented to person, place and time. HEAD:  Short gray hair.  Normocephalic, atraumatic, face symmetric, no Cushingoid features. EYES:  Blue eyes.  Pupils equal round and reactive to light and accomodation.  No conjunctivitis or scleral icterus. ENT:  Oropharynx clear without lesion.  Tongue normal. Mucous membranes moist.  RESPIRATORY:  Clear to auscultation without rales, wheezes or rhonchi. CARDIOVASCULAR:  Regular rate and rhythm without murmur, rub or gallop. ABDOMEN:  Soft, non-tender, with active bowel sounds, and no hepatosplenomegaly.  No masses. SKIN:  No rashes, ulcers or lesions. EXTREMITIES: No edema, no skin discoloration or tenderness.  No palpable cords. LYMPH NODES: No palpable cervical, supraclavicular, axillary or inguinal adenopathy  NEUROLOGICAL: Unremarkable. PSYCH:  Appropriate.  Results for orders placed or performed during the hospital encounter of 11/21/14 (from the past 48 hour(s))  CBC     Status: Abnormal   Collection Time: 11/26/14  7:05 AM  Result Value Ref Range   WBC 0.6 (LL) 3.6 - 11.0 K/uL    Comment: CRITICAL VALUE NOTED.  VALUE IS CONSISTENT WITH PREVIOUSLY REPORTED AND CALLED VALUE.   RBC 2.83 (L) 3.80 - 5.20 MIL/uL  Hemoglobin 8.0 (L) 12.0 - 16.0 g/dL   HCT 24.3 (L) 35.0 - 47.0 %   MCV 86.1 80.0 - 100.0 fL   MCH 28.4 26.0 - 34.0 pg   MCHC 33.0 32.0 - 36.0 g/dL   RDW 16.0 (H) 11.5 - 14.5 %   Platelets 42 (L) 150 - 440 K/uL  Protime-INR     Status: Abnormal   Collection Time: 11/26/14  7:05 AM  Result Value Ref Range   Prothrombin Time 20.5 (H) 11.4 - 15.0 seconds   INR 1.76   Protime-INR     Status: Abnormal   Collection Time: 11/27/14  6:15 AM  Result Value  Ref Range   Prothrombin Time 20.3 (H) 11.4 - 15.0 seconds   INR 1.74   CBC with Differential     Status: Abnormal   Collection Time: 11/27/14  6:15 AM  Result Value Ref Range   WBC 1.4 (LL) 3.6 - 11.0 K/uL    Comment: CRITICAL VALUE NOTED.  VALUE IS CONSISTENT WITH PREVIOUSLY REPORTED AND CALLED VALUE. RESULT REPEATED AND VERIFIED    RBC 2.66 (L) 3.80 - 5.20 MIL/uL   Hemoglobin 7.5 (L) 12.0 - 16.0 g/dL   HCT 22.7 (L) 35.0 - 47.0 %   MCV 85.3 80.0 - 100.0 fL   MCH 28.3 26.0 - 34.0 pg   MCHC 33.1 32.0 - 36.0 g/dL   RDW 15.7 (H) 11.5 - 14.5 %   Platelets 38 (L) 150 - 440 K/uL   Neutrophils Relative % 66% %   Neutro Abs 0.9 (L) 1.4 - 6.5 K/uL   Lymphocytes Relative 23% %   Lymphs Abs 0.3 (L) 1.0 - 3.6 K/uL   Monocytes Relative 10% %   Monocytes Absolute 0.1 (L) 0.2 - 0.9 K/uL   Eosinophils Relative 0% %   Eosinophils Absolute 0.0 0 - 0.7 K/uL   Basophils Relative 1% %   Basophils Absolute 0.0 0 - 0.1 K/uL   No results found.  Assessment:  Emily Zamora is a 59 y.o. female with stage IV endometrial cancer currently day 11 s/p cycle #1 cisplatin and Taxotere.  Patient admitted with fever and neutropenia.  Counts improving slowly with Granix.  No + blood cultures.  E coli UTI on Cetazidime.  On Coumadin for history of pulmonary emboli in 02/2014.  INR sub-therapeutic (1.74).  Moderate thrombocytopenia (38,000) without bleeding.  Patient on full dose aspirin.  Plan: 1. Hematology/Oncology:  Day 11 s/p cisplatin and taxotere.  Counts slowly improving on Granix.  Continue daily injections (ANC goal around 5000 to assure that when stops will not return to neutropenic range).  Anticipate next cycle with Neulasta.        History of pulmonary embolism.  Pharmacy adjusting Coumadin.  With platelets low, will hold aspirin. Check counts daily.         Add type and screen.  2. Infectious disease:  No + blood cultures; E coli UTI.  On ceftazidime. 3. Disposition:  Anticipate discharge in next 1-2  days as counts continue to improve.  Patient has bone scan scheduled for 12/04/2014 and follow-up with Dr. Oliva Bustard on 12/08/2014.  Lequita Asal, MD  11/27/2014, 8:03 PM

## 2014-11-28 ENCOUNTER — Encounter: Admit: 2014-11-28 | Payer: No Typology Code available for payment source

## 2014-11-28 LAB — CBC WITH DIFFERENTIAL/PLATELET
Basophils Absolute: 0 10*3/uL (ref 0–0.1)
Basophils Relative: 0 %
Eosinophils Absolute: 0 10*3/uL (ref 0–0.7)
Eosinophils Relative: 0 %
HCT: 22.9 % — ABNORMAL LOW (ref 35.0–47.0)
Hemoglobin: 7.6 g/dL — ABNORMAL LOW (ref 12.0–16.0)
Lymphocytes Relative: 19 %
Lymphs Abs: 0.4 10*3/uL — ABNORMAL LOW (ref 1.0–3.6)
MCH: 28.4 pg (ref 26.0–34.0)
MCHC: 33.3 g/dL (ref 32.0–36.0)
MCV: 85.3 fL (ref 80.0–100.0)
Monocytes Absolute: 0.2 10*3/uL (ref 0.2–0.9)
Monocytes Relative: 8 %
Neutro Abs: 1.7 10*3/uL (ref 1.4–6.5)
Neutrophils Relative %: 73 %
Platelets: 41 10*3/uL — ABNORMAL LOW (ref 150–440)
RBC: 2.69 MIL/uL — ABNORMAL LOW (ref 3.80–5.20)
RDW: 16.1 % — ABNORMAL HIGH (ref 11.5–14.5)
WBC: 2.4 10*3/uL — ABNORMAL LOW (ref 3.6–11.0)

## 2014-11-28 LAB — TYPE AND SCREEN
ABO/RH(D): O POS
Antibody Screen: NEGATIVE

## 2014-11-28 LAB — PROTIME-INR
INR: 1.54
Prothrombin Time: 18.5 seconds — ABNORMAL HIGH (ref 11.4–15.0)

## 2014-11-28 MED ORDER — POLYETHYLENE GLYCOL 3350 17 G PO PACK
17.0000 g | PACK | Freq: Every day | ORAL | Status: DC
  Administered 2014-11-28 – 2014-11-29 (×2): 17 g via ORAL
  Filled 2014-11-28 (×2): qty 1

## 2014-11-28 MED ORDER — CEFUROXIME AXETIL 500 MG PO TABS
500.0000 mg | ORAL_TABLET | Freq: Two times a day (BID) | ORAL | Status: DC
  Administered 2014-11-28 – 2014-11-29 (×2): 500 mg via ORAL
  Filled 2014-11-28 (×2): qty 1

## 2014-11-28 MED ORDER — WARFARIN SODIUM 5 MG PO TABS
6.0000 mg | ORAL_TABLET | Freq: Every day | ORAL | Status: DC
  Administered 2014-11-28: 6 mg via ORAL
  Filled 2014-11-28: qty 1

## 2014-11-28 NOTE — Progress Notes (Signed)
Patient ID: Emily Zamora, female   DOB: Jan 02, 1956, 59 y.o.   MRN: [id number] [place] [facility] [place] NAME: Emily Zamora    MR#:  [id number]  DATE OF BIRTH:  December 20, 1955  SUBJECTIVE:  No fever doing well REVIEW OF SYSTEMS:   Review of Systems  Constitutional: Negative for fever, chills and weight loss.  HENT: Negative for ear discharge, ear pain and nosebleeds.   Eyes: Negative for blurred vision, pain and discharge.  Respiratory: Negative for sputum production, shortness of breath, wheezing and stridor.   Cardiovascular: Negative for chest pain, palpitations, orthopnea and PND.  Gastrointestinal: Negative for nausea, vomiting, abdominal pain and diarrhea.  Genitourinary: Negative for urgency and frequency.  Musculoskeletal: Positive for back pain. Negative for joint pain.  Neurological: Positive for weakness. Negative for sensory change, speech change and focal weakness.  Psychiatric/Behavioral: Negative for depression and hallucinations. The patient is not nervous/anxious.   All other systems reviewed and are negative.  Tolerating Diet:yes Tolerating PT: yes  DRUG ALLERGIES:   Allergies  Allergen Reactions  . Sulfa Antibiotics Anaphylaxis    VITALS:  Blood pressure 112/70, pulse 93, temperature 99.8 F (37.7 C), temperature source Oral, resp. rate 18, height 5\' 4"  (1.626 m), weight 71.986 kg (158 lb 11.2 oz), SpO2 94 %.  PHYSICAL EXAMINATION:   Physical Exam  GENERAL:  59 y.o.-year-old patient lying in the bed with no acute distress.  EYES: Pupils equal, round, reactive to light and accommodation. No scleral icterus. Extraocular muscles intact.  HEENT: Head atraumatic, normocephalic. Oropharynx and nasopharynx clear.  NECK:  Supple, no jugular venous distention. No thyroid enlargement, no tenderness.  LUNGS: Normal breath sounds bilaterally, no wheezing, rales, rhonchi. No use of accessory muscles of respiration. Port  + CARDIOVASCULAR: S1, S2 normal. No murmurs, rubs, or gallops.  ABDOMEN: Soft, nontender, nondistended. Bowel sounds present. No organomegaly or mass.  EXTREMITIES: No cyanosis, clubbing or edema b/l.    NEUROLOGIC: Cranial nerves II through XII are intact. No focal Motor or sensory deficits b/l.   PSYCHIATRIC: The patient is alert and oriented x 3.  SKIN: No obvious rash, lesion, or ulcer.    LABORATORY PANEL:   CBC  Recent Labs Lab 11/28/14 0600  WBC 2.4*  HGB 7.6*  HCT 22.9*  PLT 41*    Chemistries   Recent Labs Lab 11/23/14 0501  NA 135  K 3.8  CL 107  CO2 22  GLUCOSE 79  BUN 28*  CREATININE 0.84  CALCIUM 7.0*    Cardiac Enzymes No results for input(s): TROPONINI in the last 168 hours.  RADIOLOGY:  No results found.   ASSESSMENT AND PLAN:  * Sepsis secondary to acute cystitis and Neutropenic fevertoday Blood culture negative x4 so far UC Ecoli stool for culture- negative and C. difficile negative.  IV Fortaz continued--> to po ceftin on see is a contact with [place] and she is not I appreciated Oncology consult  *Neutropenia due to chemo  OnIV Granix for neutropenia. Day 7 -0.5-->0.5-->0.6-->1.4-->2.4  * Thrombocytopenia stable Status post platelet transfusion as recommended by Oncology 11/23/2014  No active bleeding.  * Sinus tachycardia could be from problem #1 with sepsis on atenolol   * History of DVT She is on Coumadin Pharmacy to help manage the dose.  * Endometrial cancer with metastasis She is on chemotherapy , oncology for further management.  * Hypertension continue atenolol.  * Hyperkalemia Resolved with Oral kayexalate.   Case discussed with Care  Management/Social Worker. Management plans discussed with the patient, family and they are in agreement.  CODE STATUS: full  DVT Prophylaxis: on coumadin  TOTAL TIME TAKING CARE OF THIS PATIENT: 30 minutes.  >50% time spent on counselling and coordination of care  pt ns son  POSSIBLE D/C IN 1-2 DAYS, DEPENDING ON CLINICAL CONDITION.   [name] M.D on 11/28/2014 at 1:45 PM  Between 7am to 6pm - Pager - [phone]  After 6pm go to [email or web address] - password [email or web address] [facility]  Office  [phone]  CC: Primary care physician; Ashok Norris, MD

## 2014-11-28 NOTE — Plan of Care (Signed)
Problem: Nutrition: Goal: Adequate nutrition will be maintained Outcome: Progressing Pain medication given with noted relief. VSS. Labs improving. Up to bathroom with 1 assist. Son at bedside.

## 2014-11-28 NOTE — Progress Notes (Signed)
[facility] Hematology/Oncology Progress Note  Date of admission: 11/21/2014  Hospital day:  11/28/2014  Chief Complaint: Emily Zamora is a 59 y.o. female with endometrial cancer who was admitted with fever and neutropenia.  Subjective:  Feeling "ok".  No new complaints.  Social History: The patient is accompanied by her son, Roderic Palau, today.  Allergies:  Allergies  Allergen Reactions  . Sulfa Antibiotics Anaphylaxis    Scheduled Medications: . atenolol  100 mg Oral Daily  . cefUROXime  500 mg Oral BID WC  . citalopram  40 mg Oral Daily  . famotidine  20 mg Oral BID  . fentaNYL  75 mcg Transdermal Q72H  . loratadine  10 mg Oral Daily  . mirtazapine  15 mg Oral QHS  . multivitamin with minerals  1 tablet Oral Daily  . senna-docusate  1 tablet Oral BID  . sucralfate  1 g Oral TID WC & HS  . Tbo-Filgrastim  480 mcg Subcutaneous Daily  . warfarin  6 mg Oral q1800  . Warfarin - Pharmacist Dosing Inpatient   Does not apply q1800    Review of Systems: GENERAL:  Fatigue.  T max 24 hours 100.  No sweats or weight loss. PERFORMANCE STATUS (ECOG):  2 HEENT:  No visual changes, runny nose, sore throat, mouth sores or tenderness. Lungs: No shortness of breath or cough.  No hemoptysis. Cardiac:  No chest pain, palpitations, orthopnea, or PND. GI:  No nausea, vomiting, diarrhea, constipation, melena or hematochezia. GU:  No urgency, frequency, dysuria, or hematuria. Musculoskeletal:  Back pain secondary to cancer.  No joint pain.  No muscle tenderness. Extremities:  No pain or swelling. Skin:  No rashes or skin changes. Neuro:  No headache, numbness or weakness, balance or coordination issues. Endocrine:  No diabetes, thyroid issues, hot flashes or night sweats. Psych:  No mood changes, depression or anxiety. Pain:  No focal pain. Review of systems:  All other systems reviewed and found to be negative.  Physical Exam: Blood pressure 112/70, pulse 93,  temperature 99.8 F (37.7 C), temperature source Oral, resp. rate 18, height 5\' 4"  (1.626 m), weight 158 lb 11.2 oz (71.986 kg), SpO2 94 %.  GENERAL:  Well developed, well nourished, sitting comfortably on the medical unit in no acute distress. MENTAL STATUS:  Alert and oriented to person, place and time. HEAD:  Short gray hair.  Normocephalic, atraumatic, face symmetric, no Cushingoid features. EYES:  Blue eyes.  Pupils equal round and reactive to light and accomodation.  No conjunctivitis or scleral icterus. ENT:  Oropharynx clear without lesion.  Tongue normal. Mucous membranes moist.  RESPIRATORY:  Clear to auscultation without rales, wheezes or rhonchi. CARDIOVASCULAR:  Regular rate and rhythm without murmur, rub or gallop. ABDOMEN:  Soft, non-tender, with active bowel sounds, and no hepatosplenomegaly.  No masses. SKIN:  No rashes, ulcers or lesions. EXTREMITIES: No edema, no skin discoloration or tenderness.  No palpable cords. LYMPH NODES: No palpable cervical, supraclavicular, axillary or inguinal adenopathy  NEUROLOGICAL: Unremarkable. PSYCH:  Appropriate.  Results for orders placed or performed during the hospital encounter of 11/21/14 (from the past 48 hour(s))  Protime-INR     Status: Abnormal   Collection Time: 11/27/14  6:15 AM  Result Value Ref Range   Prothrombin Time 20.3 (H) 11.4 - 15.0 seconds   INR 1.74   CBC with Differential     Status: Abnormal   Collection Time: 11/27/14  6:15 AM  Result Value Ref Range  WBC 1.4 (LL) 3.6 - 11.0 K/uL    Comment: CRITICAL VALUE NOTED.  VALUE IS CONSISTENT WITH PREVIOUSLY REPORTED AND CALLED VALUE. RESULT REPEATED AND VERIFIED    RBC 2.66 (L) 3.80 - 5.20 MIL/uL   Hemoglobin 7.5 (L) 12.0 - 16.0 g/dL   HCT 22.7 (L) 35.0 - 47.0 %   MCV 85.3 80.0 - 100.0 fL   MCH 28.3 26.0 - 34.0 pg   MCHC 33.1 32.0 - 36.0 g/dL   RDW 15.7 (H) 11.5 - 14.5 %   Platelets 38 (L) 150 - 440 K/uL   Neutrophils Relative % 66% %   Neutro Abs 0.9 (L)  1.4 - 6.5 K/uL   Lymphocytes Relative 23% %   Lymphs Abs 0.3 (L) 1.0 - 3.6 K/uL   Monocytes Relative 10% %   Monocytes Absolute 0.1 (L) 0.2 - 0.9 K/uL   Eosinophils Relative 0% %   Eosinophils Absolute 0.0 0 - 0.7 K/uL   Basophils Relative 1% %   Basophils Absolute 0.0 0 - 0.1 K/uL  Reticulocytes     Status: Abnormal   Collection Time: 11/27/14  8:45 PM  Result Value Ref Range   Retic Ct Pct 1.9 0.4 - 3.1 %   RBC. 2.68 (L) 3.80 - 5.20 MIL/uL   Retic Count, Manual 50.9 19.0 - 183.0 K/uL  CBC with Differential     Status: Abnormal   Collection Time: 11/28/14  6:00 AM  Result Value Ref Range   WBC 2.4 (L) 3.6 - 11.0 K/uL   RBC 2.69 (L) 3.80 - 5.20 MIL/uL   Hemoglobin 7.6 (L) 12.0 - 16.0 g/dL   HCT 22.9 (L) 35.0 - 47.0 %   MCV 85.3 80.0 - 100.0 fL   MCH 28.4 26.0 - 34.0 pg   MCHC 33.3 32.0 - 36.0 g/dL   RDW 16.1 (H) 11.5 - 14.5 %   Platelets 41 (L) 150 - 440 K/uL   Neutrophils Relative % 73% %   Neutro Abs 1.7 1.4 - 6.5 K/uL   Lymphocytes Relative 19% %   Lymphs Abs 0.4 (L) 1.0 - 3.6 K/uL   Monocytes Relative 8% %   Monocytes Absolute 0.2 0.2 - 0.9 K/uL   Eosinophils Relative 0% %   Eosinophils Absolute 0.0 0 - 0.7 K/uL   Basophils Relative 0% %   Basophils Absolute 0.0 0 - 0.1 K/uL  Protime-INR     Status: Abnormal   Collection Time: 11/28/14  8:45 AM  Result Value Ref Range   Prothrombin Time 18.5 (H) 11.4 - 15.0 seconds   INR 1.54   Type and screen [facility]     Status: None   Collection Time: 11/28/14  8:45 AM  Result Value Ref Range   ABO/RH(D) O POS    Antibody Screen NEG    Sample Expiration 12/01/2014    No results found.  Assessment:  Emily Zamora is a 59 y.o. female with stage IV endometrial cancer currently day 12 s/p cycle #1 cisplatin and Taxotere.  Patient admitted with fever and neutropenia.  Counts improving slowly with Granix.  No + blood cultures.  E coli UTI on Cetazidime.  On Coumadin for history of pulmonary emboli in 02/2014.   INR sub-therapeutic (1.54).  Moderate thrombocytopenia (41,000) without bleeding.  ANC improving.  Aspirin discontinued yesterday.  Plan: 1. Hematology/Oncology:  Day 12 s/p cisplatin and taxotere.  Counts slowly improving on Granix.  ANC 1700.  Continue daily injections (ANC goal around 5000 to assure that  when stops will not return to neutropenic range).  Anticipate next cycle with Neulasta.        History of pulmonary embolism.  Pharmacy adjusting Coumadin.  With platelets low, aspirin on hold. Check counts daily.    2. Infectious disease:  Low grade temperature.  No + blood cultures; E coli UTI.  Ceftazidime switched to cefuroxime.  Watch INR closely as cefuroxime may increase INR. 3. Disposition:  Anticipate discharge possibly tomorrow as counts continue to improve.  Patient's son states that he will be able to help with care at home.  Patient has bone scan scheduled for 12/04/2014 and follow-up with Dr. Oliva Bustard on 12/08/2014.   Lequita Asal, MD  11/28/2014, 12:19 PM

## 2014-11-28 NOTE — Plan of Care (Signed)
Problem: Education: Goal: Knowledge of  General Education information/materials will improve Outcome: Progressing Pt very knowledgeable of condition.  Asks questions about medications appropriately.  Problem: Safety: Goal: Ability to remain free from injury will improve Outcome: Progressing Pt remains free from injury.  Calls with bathroom or ambulating needs.  Call bell and phone within reach.  Bed in lowest position.  Uses walker and one person assist r/t left leg weakness.  Son in room.

## 2014-11-28 NOTE — Progress Notes (Signed)
ANTICOAGULATION CONSULT NOTE - Follow UP  Pharmacy Consult for Coumadin Indication: History of DVT  Allergies  Allergen Reactions  . Sulfa Antibiotics Anaphylaxis    Patient Measurements: Height: 5\' 4"  (162.6 cm) Weight: 158 lb 11.2 oz (71.986 kg) IBW/kg (Calculated) : 54.7  Vital Signs: Temp: 99.8 F (37.7 C) (11/25 0413) Temp Source: Oral (11/25 0413) BP: 112/70 mmHg (11/25 0413) Pulse Rate: 93 (11/25 0413)  Labs:  Recent Labs  11/26/14 0705 11/27/14 0615 11/28/14 0600  HGB 8.0* 7.5* 7.6*  HCT 24.3* 22.7* 22.9*  PLT 42* 38* 41*  LABPROT 20.5* 20.3*  --   INR 1.76 1.74  --     Medical History: Past Medical History  Diagnosis Date  . Cancer ([place])   . Primary cancer of endometrium ([place]) 05/24/2014  . Hypertension   . Knee pain, bilateral   . Arthritis   . Renal insufficiency    Assessment: Patient is a 59yo female currently undergoing chemotherapy for endometrial carcinoma with metastasis. Patient also has a history of DVT for which she was taking warfarin prior to admission. Patient's home regimen is warfarin 5 mg PO daily.   INR today is subtherapeutic at 1.76  Dosing history: Date INR Dose 11/18 2.33 5 mg 11/19 2.29 5 mg 11/20 2.78 3 mg 11/21 2.09 5 mg 11/22 1.99 5 mg  11/23 1.76 6mg   11/24 1.74 5mg  11/25 1.54     6mg   Goal of Therapy:  INR 2-3 Monitor platelets by anticoagulation protocol: Yes   Plan:  INR today is subtherapeutic. As INR has trended down since 11/20, will give another dose of 6mg .  INR ordered for tomorrow with AM labs.  Pharmacy will continue to monitor and adjust dose as needed.   Roe Coombs, PharmD Pharmacy Resident 11/28/2014 9:43 AM

## 2014-11-29 LAB — CBC WITH DIFFERENTIAL/PLATELET
Basophils Absolute: 0 10*3/uL (ref 0–0.1)
Basophils Relative: 0 %
Eosinophils Absolute: 0 10*3/uL (ref 0–0.7)
Eosinophils Relative: 0 %
HCT: 23.4 % — ABNORMAL LOW (ref 35.0–47.0)
Hemoglobin: 7.9 g/dL — ABNORMAL LOW (ref 12.0–16.0)
Lymphocytes Relative: 21 %
Lymphs Abs: 0.6 10*3/uL — ABNORMAL LOW (ref 1.0–3.6)
MCH: 28.8 pg (ref 26.0–34.0)
MCHC: 33.9 g/dL (ref 32.0–36.0)
MCV: 85 fL (ref 80.0–100.0)
Monocytes Absolute: 0.2 10*3/uL (ref 0.2–0.9)
Monocytes Relative: 7 %
Neutro Abs: 2.1 10*3/uL (ref 1.4–6.5)
Neutrophils Relative %: 72 %
Platelets: 39 10*3/uL — ABNORMAL LOW (ref 150–440)
RBC: 2.75 MIL/uL — ABNORMAL LOW (ref 3.80–5.20)
RDW: 16.1 % — ABNORMAL HIGH (ref 11.5–14.5)
WBC: 2.9 10*3/uL — ABNORMAL LOW (ref 3.6–11.0)

## 2014-11-29 LAB — PROTIME-INR
INR: 1.43
Prothrombin Time: 17.5 seconds — ABNORMAL HIGH (ref 11.4–15.0)

## 2014-11-29 MED ORDER — CEFUROXIME AXETIL 500 MG PO TABS: 500.0000 mg | ORAL_TABLET | Freq: Two times a day (BID) | ORAL | Status: DC

## 2014-11-29 MED ORDER — WARFARIN SODIUM 5 MG PO TABS: 7.5000 mg | ORAL_TABLET | Freq: Every day | ORAL | Status: DC

## 2014-11-29 NOTE — Progress Notes (Addendum)
ANTICOAGULATION CONSULT NOTE - Follow UP  Pharmacy Consult for Coumadin Indication: History of DVT  Allergies  Allergen Reactions  . Sulfa Antibiotics Anaphylaxis    Patient Measurements: Height: 5\' 4"  (162.6 cm) Weight: 158 lb 11.2 oz (71.986 kg) IBW/kg (Calculated) : 54.7  Vital Signs: Temp: 98.2 F (36.8 C) (11/26 0433) Temp Source: Oral (11/26 0433) BP: 119/80 mmHg (11/26 0433) Pulse Rate: 91 (11/26 0433)  Labs:  Recent Labs  11/27/14 0615 11/28/14 0600 11/28/14 0845 11/29/14 0440  HGB 7.5* 7.6*  --  7.9*  HCT 22.7* 22.9*  --  23.4*  PLT 38* 41*  --  39*  LABPROT 20.3*  --  18.5* 17.5*  INR 1.74  --  1.54 1.43    Medical History: Past Medical History  Diagnosis Date  . Cancer ([place])   . Primary cancer of endometrium ([place]) 05/24/2014  . Hypertension   . Knee pain, bilateral   . Arthritis   . Renal insufficiency    Assessment: Patient is a 59yo female currently undergoing chemotherapy for endometrial carcinoma with metastasis. Patient also has a history of DVT for which she was taking warfarin prior to admission. Patient's home regimen is warfarin 5 mg PO daily.   INR today is subtherapeutic at 1.76  Dosing history: Date INR Dose 11/18 2.33 5 mg 11/19 2.29 5 mg 11/20 2.78 3 mg 11/21 2.09 5 mg 11/22 1.99 5 mg  11/23 1.76 6 mg  11/24 1.74 5 mg 11/25 1.54     6 mg 11/26   1.46     7 mg  Goal of Therapy:  INR 2-3 Monitor platelets by anticoagulation protocol: Yes   Plan:  INR today is subtherapeutic. As INR has trended down since 11/20, will increase dose to 7.5mg  daily.  INR ordered for tomorrow with AM labs.  Pharmacy will continue to monitor and adjust dose as needed.   Olivia Canter, [facility] Clinical Pharmacist  11/29/2014, 8:23 AM

## 2014-11-29 NOTE — Care Management Note (Signed)
Case Management Note  Patient Details  Name: JENNINGS BUSHNER MRN: [id number] Date of Birth: [birth year]  Subjective/Objective:      This Probation officer received a phone call from [facility] reporting that Ms Kalisha Piontek is a current home health patient of [facility] for RN services and they will need resumption of care orders.               Action/Plan:   Expected Discharge Date:                  Expected Discharge Plan:     In-House Referral:     Discharge planning Services     Post Acute Care Choice:    Choice offered to:     DME Arranged:    DME Agency:     HH Arranged:    [place] Agency:     Status of Service:     Medicare Important Message Given:    Date Medicare IM Given:    Medicare IM give by:    Date Additional Medicare IM Given:    Additional Medicare Important Message give by:     If discussed at [place] of Stay Meetings, dates discussed:    Additional Comments:  Keontae Levingston A, RN 11/29/2014, 11:09 AM

## 2014-11-29 NOTE — Discharge Summary (Signed)
[facility] [place] NAME: Emily Zamora    MR#:  [id number]  DATE OF BIRTH:  August 25, 1955  DATE OF ADMISSION:  11/21/2014 ADMITTING PHYSICIAN: Vaughan Basta, MD  DATE OF DISCHARGE: 11/29/14  PRIMARY CARE PHYSICIAN: Ashok Norris, MD    ADMISSION DIAGNOSIS:  Dehydration [E86.0] Fever and neutropenia ([place]) [D70.9, R50.81] Neutropenia, unspecified type ([place]) [D70.9]  DISCHARGE DIAGNOSIS:  Sepsis due to [place] Chemotherapy induced Neutropenia-improving Endometrial cancer  SECONDARY DIAGNOSIS:   Past Medical History  Diagnosis Date  . Cancer ([place])   . Primary cancer of endometrium ([place]) 05/24/2014  . Hypertension   . Knee pain, bilateral   . Arthritis   . Renal insufficiency     HOSPITAL COURSE:  * Sepsis secondary to acute cystitis and Neutropenic fever Blood culture negative x4 so far. Sepsis resolved UC Ecoli stool for culture- negative and C. difficile negative.  IV Fortaz continued--> to po ceftin  appreciated Oncology consult  *Neutropenia due to chemo OnIV Granix for neutropenia. Day 8 -0.5-->0.5-->0.6-->1.4-->2.4-->2.9  * Thrombocytopenia stable Status post platelet transfusion as recommended by Oncology 11/23/2014  No active bleeding.  * Sinus tachycardia could be from problem #1 with sepsis on atenolol   * History of DVT She is on Coumadin. Dose adjusted since INR 1.43. Pt instructed to get INR checked tues or wed of next week. Pharmacy to help manage the dose.  * Endometrial cancer with metastasis She is on chemotherapy , oncology for further management.  * Hypertension continue atenolol.  D/c home today CONSULTS OBTAINED:  Dr Patrecia Pace  DRUG ALLERGIES:   Allergies  Allergen Reactions  . Sulfa Antibiotics Anaphylaxis    DISCHARGE MEDICATIONS:   Current Discharge Medication List    START taking these medications   Details  cefUROXime (CEFTIN) 500  MG tablet Take 1 tablet (500 mg total) by mouth 2 (two) times daily with a meal. Qty: 4 tablet, Refills: 0      CONTINUE these medications which have CHANGED   Details  warfarin (COUMADIN) 5 MG tablet Take 1.5 tablets (7.5 mg total) by mouth daily at 6 PM. Qty: 30 tablet, Refills: 1      CONTINUE these medications which have NOT CHANGED   Details  atenolol (TENORMIN) 100 MG tablet Take 1 tablet (100 mg total) by mouth daily. Qty: 30 tablet, Refills: 5    citalopram (CELEXA) 40 MG tablet Take 1 tablet (40 mg total) by mouth daily. Qty: 30 tablet, Refills: 2    cyclobenzaprine (FLEXERIL) 5 MG tablet Take 5 mg by mouth 3 (three) times daily as needed for muscle spasms.    dexamethasone (DECADRON) 4 MG tablet Take 8 mg by mouth 2 (two) times daily. Pt is to take the day before chemo(Taxotere).    famotidine (PEPCID) 20 MG tablet Take 1 tablet (20 mg total) by mouth 2 (two) times daily. Qty: 60 tablet, Refills: 5   Associated Diagnoses: Endometrial cancer (HCC)    fentaNYL (DURAGESIC - DOSED MCG/HR) 75 MCG/HR Place 1 patch (75 mcg total) onto the skin every 3 (three) days. Qty: 10 patch, Refills: 0   Associated Diagnoses: Endometrial cancer ([place]); Primary cancer of endometrium ([place]); Low back pain, unspecified back pain laterality, with sciatica presence unspecified    lidocaine-prilocaine (EMLA) cream Apply 1 application topically as needed. Qty: 30 g, Refills: 3   Associated Diagnoses: Endometrial cancer (HCC)    loratadine (CLARITIN) 10 MG tablet Take 1 tablet (10 mg total) by mouth  daily. Qty: 30 tablet, Refills: 5   Associated Diagnoses: Endometrial cancer (HCC)    mirtazapine (REMERON) 15 MG tablet Take 15 mg by mouth at bedtime.    Multiple Vitamin (MULTIVITAMIN WITH MINERALS) TABS tablet Take 1 tablet by mouth daily.    ondansetron (ZOFRAN) 4 MG tablet Take 1 tablet (4 mg total) by mouth every 6 (six) hours as needed. Qty: 30 tablet, Refills: 3   Associated Diagnoses:  Endometrial cancer (HCC)    oxyCODONE (ROXICODONE) 15 MG immediate release tablet Take 2 tablets (30 mg total) by mouth every 2 (two) hours as needed for pain. Qty: 90 tablet, Refills: 0   Associated Diagnoses: Endometrial cancer ([place]); Primary cancer of endometrium ([place]); Low back pain, unspecified back pain laterality, with sciatica presence unspecified    psyllium (METAMUCIL) 58.6 % powder Take 1 packet by mouth 3 (three) times daily as needed (for constipation).     senna-docusate (SENOKOT-S) 8.6-50 MG tablet Take 1 tablet by mouth 2 (two) times daily.    sucralfate (CARAFATE) 1 G tablet Take 1 tablet (1 g total) by mouth 4 (four) times daily -  with meals and at bedtime. Qty: 90 tablet, Refills: 3      STOP taking these medications     Multiple Vitamin (MULTIVITAMIN) capsule         If you experience worsening of your admission symptoms, develop shortness of breath, life threatening emergency, suicidal or homicidal thoughts you must seek medical attention immediately by calling 911 or calling your MD immediately  if symptoms less severe.  You Must read complete instructions/literature along with all the possible adverse reactions/side effects for all the Medicines you take and that have been prescribed to you. Take any new Medicines after you have completely understood and accept all the possible adverse reactions/side effects.   Please note  You were cared for by a hospitalist during your hospital stay. If you have any questions about your discharge medications or the care you received while you were in the hospital after you are discharged, you can call the unit and asked to speak with the hospitalist on call if the hospitalist that took care of you is not available. Once you are discharged, your primary care physician will handle any further medical issues. Please note that NO REFILLS for any discharge medications will be authorized once you are discharged, as it is imperative that  you return to your primary care physician (or establish a relationship with a primary care physician if you do not have one) for your aftercare needs so that they can reassess your need for medications and monitor your lab values. Today   SUBJECTIVE   No complaints  VITAL SIGNS:  Blood pressure 119/80, pulse 91, temperature 98.2 F (36.8 C), temperature source Oral, resp. rate 18, height 5\' 4"  (1.626 m), weight 158 lb 11.2 oz (71.986 kg), SpO2 95 %.  I/O:   Intake/Output Summary (Last 24 hours) at 11/29/14 0704 Last data filed at 11/29/14 0232  Gross per 24 hour  Intake      0 ml  Output   2650 ml  Net  -2650 ml    PHYSICAL EXAMINATION:  GENERAL:  59 y.o.-year-old patient lying in the bed with no acute distress.  EYES: Pupils equal, round, reactive to light and accommodation. No scleral icterus. Extraocular muscles intact.  HEENT: Head atraumatic, normocephalic. Oropharynx and nasopharynx clear.  NECK:  Supple, no jugular venous distention. No thyroid enlargement, no tenderness.  LUNGS: Normal breath sounds  bilaterally, no wheezing, rales,rhonchi or crepitation. No use of accessory muscles of respiration.  CARDIOVASCULAR: S1, S2 normal. No murmurs, rubs, or gallops.  ABDOMEN: Soft, non-tender, non-distended. Bowel sounds present. No organomegaly or mass.  EXTREMITIES: No pedal edema, cyanosis, or clubbing.  NEUROLOGIC: Cranial nerves II through XII are intact. Muscle strength 5/5 in all extremities. Sensation intact. Gait not checked.  PSYCHIATRIC: The patient is alert and oriented x 3.  SKIN: No obvious rash, lesion, or ulcer.   DATA REVIEW:   CBC   Recent Labs Lab 11/29/14 0440  WBC 2.9*  HGB 7.9*  HCT 23.4*  PLT 39*    Chemistries   Recent Labs Lab 11/23/14 0501  NA 135  K 3.8  CL 107  CO2 22  GLUCOSE 79  BUN 28*  CREATININE 0.84  CALCIUM 7.0*    Microbiology Results   Recent Results (from the past 240 hour(s))  Culture, blood (routine x 2)      Status: None   Collection Time: 11/21/14 11:18 AM  Result Value Ref Range Status   Specimen Description BLOOD PORTA CATH  Final   Special Requests BOTTLES DRAWN AEROBIC AND ANAEROBIC  5CC  Final   Culture NO GROWTH 5 DAYS  Final   Report Status 11/26/2014 FINAL  Final  Culture, blood (routine x 2)     Status: None   Collection Time: 11/21/14 11:18 AM  Result Value Ref Range Status   Specimen Description BLOOD RIGHT ASSIST CONTROL  Final   Special Requests BOTTLES DRAWN AEROBIC AND ANAEROBIC  5CC  Final   Culture NO GROWTH 5 DAYS  Final   Report Status 11/26/2014 FINAL  Final  Urine culture     Status: None   Collection Time: 11/21/14 11:18 AM  Result Value Ref Range Status   Specimen Description URINE, CLEAN CATCH  Final   Special Requests Immunocompromised  Final   Culture >=100,000 COLONIES/mL ESCHERICHIA COLI  Final   Report Status 11/23/2014 FINAL  Final   Organism ID, Bacteria ESCHERICHIA COLI  Final      Susceptibility   Escherichia coli - MIC*    AMPICILLIN <=2 SENSITIVE Sensitive     CEFAZOLIN <=4 SENSITIVE Sensitive     CEFTRIAXONE <=1 SENSITIVE Sensitive     CIPROFLOXACIN <=0.25 SENSITIVE Sensitive     GENTAMICIN <=1 SENSITIVE Sensitive     IMIPENEM <=0.25 SENSITIVE Sensitive     NITROFURANTOIN <=16 SENSITIVE Sensitive     TRIMETH/SULFA <=20 SENSITIVE Sensitive     PIP/TAZO Value in next row Sensitive      SENSITIVE<=4    LEVOFLOXACIN Value in next row Sensitive      SENSITIVE<=0.12    * >=100,000 COLONIES/mL ESCHERICHIA COLI  C difficile quick scan w PCR reflex     Status: None   Collection Time: 11/21/14  3:14 PM  Result Value Ref Range Status   C Diff antigen NEGATIVE NEGATIVE Final   C Diff toxin NEGATIVE NEGATIVE Final   C Diff interpretation Negative for C. difficile  Final  Stool culture     Status: None   Collection Time: 11/22/14  1:30 PM  Result Value Ref Range Status   Specimen Description STOOL  Final   Special Requests NONE  Final   Culture    Final    NO SALMONELLA OR SHIGELLA ISOLATED NO CAMPYLOBACTER DETECTED No Pathogenic E. coli detected    Report Status 11/25/2014 FINAL  Final  Culture, blood (routine x 2)     Status: None (  Preliminary result)   Collection Time: 11/25/14  2:20 PM  Result Value Ref Range Status   Specimen Description BLOOD LEFT ARM  Final   Special Requests   Final    BOTTLES DRAWN AEROBIC AND ANAEROBIC  [place] AERO, 8CC ANAERO   Culture NO GROWTH 3 DAYS  Final   Report Status PENDING  Incomplete  Culture, blood (routine x 2)     Status: None (Preliminary result)   Collection Time: 11/25/14  2:27 PM  Result Value Ref Range Status   Specimen Description BLOOD RIGHT ARM  Final   Special Requests   Final    BOTTLES DRAWN AEROBIC AND ANAEROBIC  [place] AREO, [place] ANAERO   Culture NO GROWTH 3 DAYS  Final   Report Status PENDING  Incomplete    RADIOLOGY:  No results found.   Management plans discussed with the patient, family and they are in agreement.  CODE STATUS:     Code Status Orders        Start     Ordered   11/21/14 1625  Full code   Continuous     11/21/14 1625    Advance Directive Documentation        Most Recent Value   Type of Advance Directive  Healthcare Power of Attorney, Living will   Pre-existing out of facility DNR order (yellow form or pink MOST form)     "MOST" Form in Place?        TOTAL TIME TAKING CARE OF THIS PATIENT: 40 minutes.    Kazim Corrales M.D on 11/29/2014 at 7:04 AM  Between 7am to 6pm - Pager - [phone] After 6pm go to [email or web address] - password [email or web address] [facility]  Office  [phone]  CC: Primary care physician; Ashok Norris, MD

## 2014-11-29 NOTE — Progress Notes (Signed)
Patient discharged home per MD order. All discharge instructions given and all questions answered. 

## 2014-11-29 NOTE — Discharge Instructions (Signed)
Get your PT/INR checked Tuesday or wednseday of next week with PCP or Cancer center

## 2014-11-29 NOTE — Plan of Care (Signed)
Problem: Safety: Goal: Ability to remain free from injury will improve Outcome: Progressing Pt remains free from injury.  Pt calls with bathroom needs.  Uses a front wheel walker with one person standby assist.  Bed in lowest position.  Son at bedside.  Call bell and phone within reach.

## 2014-11-30 LAB — CULTURE, BLOOD (ROUTINE X 2)
CULTURE: NO GROWTH
CULTURE: NO GROWTH

## 2014-12-01 ENCOUNTER — Other Ambulatory Visit: Payer: Self-pay | Admitting: *Deleted

## 2014-12-01 ENCOUNTER — Telehealth: Payer: Self-pay | Admitting: *Deleted

## 2014-12-01 ENCOUNTER — Inpatient Hospital Stay: Payer: No Typology Code available for payment source

## 2014-12-01 DIAGNOSIS — C541 Malignant neoplasm of endometrium: Secondary | ICD-10-CM | POA: Diagnosis not present

## 2014-12-01 DIAGNOSIS — C7951 Secondary malignant neoplasm of bone: Secondary | ICD-10-CM | POA: Diagnosis not present

## 2014-12-01 DIAGNOSIS — Z5111 Encounter for antineoplastic chemotherapy: Secondary | ICD-10-CM | POA: Diagnosis not present

## 2014-12-01 DIAGNOSIS — K7460 Unspecified cirrhosis of liver: Secondary | ICD-10-CM | POA: Diagnosis not present

## 2014-12-01 DIAGNOSIS — Z9221 Personal history of antineoplastic chemotherapy: Secondary | ICD-10-CM | POA: Diagnosis not present

## 2014-12-01 DIAGNOSIS — R5383 Other fatigue: Secondary | ICD-10-CM | POA: Diagnosis not present

## 2014-12-01 DIAGNOSIS — R188 Other ascites: Secondary | ICD-10-CM | POA: Diagnosis not present

## 2014-12-01 DIAGNOSIS — Z8669 Personal history of other diseases of the nervous system and sense organs: Secondary | ICD-10-CM | POA: Diagnosis not present

## 2014-12-01 DIAGNOSIS — M79652 Pain in left thigh: Secondary | ICD-10-CM | POA: Diagnosis not present

## 2014-12-01 DIAGNOSIS — C779 Secondary and unspecified malignant neoplasm of lymph node, unspecified: Secondary | ICD-10-CM | POA: Diagnosis not present

## 2014-12-01 DIAGNOSIS — Z7901 Long term (current) use of anticoagulants: Secondary | ICD-10-CM | POA: Diagnosis not present

## 2014-12-01 DIAGNOSIS — R531 Weakness: Secondary | ICD-10-CM | POA: Diagnosis not present

## 2014-12-01 DIAGNOSIS — Z923 Personal history of irradiation: Secondary | ICD-10-CM | POA: Diagnosis not present

## 2014-12-01 DIAGNOSIS — M545 Low back pain: Secondary | ICD-10-CM

## 2014-12-01 DIAGNOSIS — Z7982 Long term (current) use of aspirin: Secondary | ICD-10-CM | POA: Diagnosis not present

## 2014-12-01 DIAGNOSIS — K219 Gastro-esophageal reflux disease without esophagitis: Secondary | ICD-10-CM | POA: Diagnosis not present

## 2014-12-01 DIAGNOSIS — I10 Essential (primary) hypertension: Secondary | ICD-10-CM | POA: Diagnosis not present

## 2014-12-01 DIAGNOSIS — Z86711 Personal history of pulmonary embolism: Secondary | ICD-10-CM | POA: Diagnosis not present

## 2014-12-01 DIAGNOSIS — Z79899 Other long term (current) drug therapy: Secondary | ICD-10-CM | POA: Diagnosis not present

## 2014-12-01 LAB — CBC WITH DIFFERENTIAL/PLATELET
BASOS PCT: 1 %
Basophils Absolute: 0 10*3/uL (ref 0–0.1)
EOS ABS: 0 10*3/uL (ref 0–0.7)
Eosinophils Relative: 0 %
HCT: 27.3 % — ABNORMAL LOW (ref 35.0–47.0)
HEMOGLOBIN: 8.9 g/dL — AB (ref 12.0–16.0)
Lymphocytes Relative: 32 %
Lymphs Abs: 1 10*3/uL (ref 1.0–3.6)
MCH: 28.1 pg (ref 26.0–34.0)
MCHC: 32.6 g/dL (ref 32.0–36.0)
MCV: 86.3 fL (ref 80.0–100.0)
MONOS PCT: 10 %
Monocytes Absolute: 0.3 10*3/uL (ref 0.2–0.9)
NEUTROS PCT: 57 %
Neutro Abs: 1.8 10*3/uL (ref 1.4–6.5)
PLATELETS: 65 10*3/uL — AB (ref 150–440)
RBC: 3.17 MIL/uL — AB (ref 3.80–5.20)
RDW: 16.7 % — ABNORMAL HIGH (ref 11.5–14.5)
WBC: 3 10*3/uL — AB (ref 3.6–11.0)

## 2014-12-01 LAB — PROTIME-INR
INR: 1.7
PROTHROMBIN TIME: 20 s — AB (ref 11.4–15.0)

## 2014-12-01 MED ORDER — OXYCODONE HCL 15 MG PO TABS
30.0000 mg | ORAL_TABLET | ORAL | Status: DC | PRN
Start: 2014-12-01 — End: 2015-01-06

## 2014-12-01 NOTE — Telephone Encounter (Signed)
Informed that prescription is ready to pick up  

## 2014-12-02 ENCOUNTER — Inpatient Hospital Stay (HOSPITAL_BASED_OUTPATIENT_CLINIC_OR_DEPARTMENT_OTHER): Payer: No Typology Code available for payment source | Admitting: Oncology

## 2014-12-02 ENCOUNTER — Encounter: Payer: Self-pay | Admitting: Oncology

## 2014-12-02 VITALS — BP 89/61 | HR 105 | Temp 96.9°F | Wt 168.4 lb

## 2014-12-02 DIAGNOSIS — C7951 Secondary malignant neoplasm of bone: Secondary | ICD-10-CM | POA: Diagnosis not present

## 2014-12-02 DIAGNOSIS — K219 Gastro-esophageal reflux disease without esophagitis: Secondary | ICD-10-CM

## 2014-12-02 DIAGNOSIS — Z5111 Encounter for antineoplastic chemotherapy: Secondary | ICD-10-CM | POA: Diagnosis not present

## 2014-12-02 DIAGNOSIS — Z8669 Personal history of other diseases of the nervous system and sense organs: Secondary | ICD-10-CM

## 2014-12-02 DIAGNOSIS — R188 Other ascites: Secondary | ICD-10-CM

## 2014-12-02 DIAGNOSIS — Z923 Personal history of irradiation: Secondary | ICD-10-CM

## 2014-12-02 DIAGNOSIS — M545 Low back pain: Secondary | ICD-10-CM | POA: Diagnosis not present

## 2014-12-02 DIAGNOSIS — C541 Malignant neoplasm of endometrium: Secondary | ICD-10-CM

## 2014-12-02 DIAGNOSIS — R5383 Other fatigue: Secondary | ICD-10-CM

## 2014-12-02 DIAGNOSIS — R531 Weakness: Secondary | ICD-10-CM

## 2014-12-02 DIAGNOSIS — Z86711 Personal history of pulmonary embolism: Secondary | ICD-10-CM

## 2014-12-02 DIAGNOSIS — Z79899 Other long term (current) drug therapy: Secondary | ICD-10-CM

## 2014-12-02 DIAGNOSIS — C779 Secondary and unspecified malignant neoplasm of lymph node, unspecified: Secondary | ICD-10-CM

## 2014-12-02 DIAGNOSIS — Z7901 Long term (current) use of anticoagulants: Secondary | ICD-10-CM

## 2014-12-02 DIAGNOSIS — Z7982 Long term (current) use of aspirin: Secondary | ICD-10-CM

## 2014-12-02 DIAGNOSIS — Z9221 Personal history of antineoplastic chemotherapy: Secondary | ICD-10-CM

## 2014-12-02 DIAGNOSIS — I10 Essential (primary) hypertension: Secondary | ICD-10-CM

## 2014-12-02 DIAGNOSIS — K7460 Unspecified cirrhosis of liver: Secondary | ICD-10-CM

## 2014-12-02 DIAGNOSIS — M79652 Pain in left thigh: Secondary | ICD-10-CM

## 2014-12-02 MED ORDER — GABAPENTIN 300 MG PO CAPS: 300.0000 mg | ORAL_CAPSULE | Freq: Two times a day (BID) | ORAL | Status: DC

## 2014-12-02 MED ORDER — FENTANYL 25 MCG/HR TD PT72: 25.0000 ug | MEDICATED_PATCH | TRANSDERMAL | Status: DC

## 2014-12-02 NOTE — Progress Notes (Signed)
Patient states she was d/c from hospital on Sunday.  Continues to have problems standing or walking.  Left leg pain 9/10 when getting up and down.  Continues to have constipation.  Appetite a little better.

## 2014-12-03 ENCOUNTER — Ambulatory Visit
Admission: RE | Admit: 2014-12-03 | Discharge: 2014-12-03 | Disposition: A | Discharge status: Home / Self Care | Payer: No Typology Code available for payment source | Source: Ambulatory Visit | Attending: Radiation Oncology | Admitting: Radiation Oncology

## 2014-12-05 ENCOUNTER — Ambulatory Visit: Admit: 2014-12-05 | Payer: No Typology Code available for payment source

## 2014-12-05 ENCOUNTER — Encounter
Admission: RE | Admit: 2014-12-05 | Discharge: 2014-12-05 | Disposition: A | Discharge status: Home / Self Care | Payer: No Typology Code available for payment source | Source: Ambulatory Visit | Attending: Radiation Oncology | Admitting: Radiation Oncology

## 2014-12-05 DIAGNOSIS — C419 Malignant neoplasm of bone and articular cartilage, unspecified: Secondary | ICD-10-CM | POA: Diagnosis present

## 2014-12-05 DIAGNOSIS — IMO0001 Reserved for inherently not codable concepts without codable children: Secondary | ICD-10-CM

## 2014-12-05 MED ORDER — TECHNETIUM TC 99M MEDRONATE IV KIT
25.0000 | PACK | Freq: Once | INTRAVENOUS | Status: AC | PRN
  Administered 2014-12-05: 23.14 via INTRAVENOUS

## 2014-12-06 ENCOUNTER — Other Ambulatory Visit: Payer: Self-pay | Admitting: Oncology

## 2014-12-06 ENCOUNTER — Encounter: Payer: Self-pay | Admitting: Oncology

## 2014-12-06 NOTE — Progress Notes (Signed)
[place] @ [facility] [phone]) [phone]  Fax:(336) [place]: [birth year]  MR#: [id number]  YOM#:[id number]  Patient Care Team: Ashok Norris, MD as PCP - General (Family Medicine)  CHIEF COMPLAINT:  Chief Complaint  Patient presents with  . 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 abdominal paracentesis x1 negative for malignant cells(22nd September)  2.PET scan shows extensive disease with lymph node metastases and bone metastases T1 N0 M1 disease biopsy is consistent with adenocarcinoma GYN or lesion.  Most likely from the endometrial cancer.  Estrogen receptor positive.  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 abdominal paracentesis x1 negative for malignant cells(22nd September)  2.PET scan shows extensive disease with lymph node metastases and bone metastases T1 N0 M1 disease biopsy is consistent with adenocarcinoma GYN or lesion.  Most likely from the endometrial cancer.  Estrogen receptor positive.  HER-2/neu receptor pending(October, 2015)  3.patient started on chemotherapy with carboplatinum and Taxolfrom November 06, 2013 4.acute pulmonary embolism February of 2016 on xeralto. 5.  Because of insurance not paying for [place] patient has been switched over to Coumadin (May, 2016) 6.  Now on  maintenance a Avastin (April, 2016)   6.  Progressing disease by CT scan and MRI scan (October, 2016) 7.  His and has finished radiation therapy to the spine    Oncology Flowsheet 11/23/2014 11/24/2014 11/25/2014 11/26/2014 11/27/2014 11/28/2014 11/29/2014  Day, Cycle - - - - - - -  bevacizumab (AVASTIN) IV - - - - - - -  CISplatin (PLATINOL) IV - - - - - - -  denosumab (XGEVA) [place] - - - - - - -  dexamethasone (DECADRON) IV - - - - - - -  diphenhydrAMINE (BENADRYL) PO 25 mg - - - - - -  DOCEtaxel (TAXOTERE) IV - - - - - - -  fosaprepitant (EMEND) IV - - - - - - -  ondansetron (ZOFRAN) IV - - - - - - -  palonosetron (ALOXI) IV - - - - - - -  Tbo-Filgrastim (GRANIX) [place] 480 _0  mcg 480 mcg    INTERVAL HISTORY:  59 year old lady with stage IV endometrial cancer recently admitted in the hospital with neutropenia.  According to patient after radiation therapy patient's pain has not improved.  Constant dull aching pain on the left side in the femur and pelvis.  Cannot bear any weight. Poor appetite.  Declining performance status due to increasing pain. Patient is here with multiple complaints Recent hospitalization with neutropenia fever   REVIEW OF SYSTEMS:    general status: Patient is feeling weak and tired.  No change in a performance status.  No chills.  No fever. HEENT:   No evidence of stomatitis Lungs: No cough or shortness of breath Cardiac: No chest pain or paroxysmal nocturnal dyspnea GI: No nausea no vomiting no diarrhea no abdominal pain Skin: No rash Lower extremity no swelling Neurological system: No tingling.  No numbness.  No other focal signs Low back pain and pain radiating down to lower extremity.  Constant dull aching throbbing pain Recent down increasing fentanyl patch as well as radiation therapy has not helped All other systems reviewed as per history of present illness  PAST MEDICAL HISTORY: Past Medical History    Diagnosis Date  . Cancer ([place])   . Primary cancer of endometrium ([place]) 05/24/2014  . Hypertension   . Knee pain, bilateral   . Arthritis   . Renal insufficiency     Significant History/PMH:   Stage 4 peritoneal Cancer:    Cirrhosis:    GERD:    endometerial cancer:    Arthritis:    Hypertension:    Migraines:    Hysterectomy:    Cholecystectomy:    Dilation and Curretage: with hysteroscopy, Aug 2012 FAMILY HISTORY Family History  Problem Relation Age of Onset  . Breast cancer Sister 92  . Hypertension Sister   . Diabetes Mother   . Hypertension Mother       ADVANCED DIRECTIVES:does have advanced healthcare directive   HEALTH MAINTENANCE: Social History  Substance Use Topics  . Smoking status: Never Smoker   . Smokeless tobacco: None  . Alcohol Use: No    :  Allergies  Allergen Reactions  . Sulfa Antibiotics Anaphylaxis    Current Outpatient Prescriptions  Medication Sig Dispense Refill  . atenolol (TENORMIN) 100 MG tablet Take 1 tablet (100 mg total) by mouth daily. 30 tablet 5  . citalopram (CELEXA) 40 MG tablet Take 1 tablet (40 mg total) by mouth daily. 30 tablet 2  . cyclobenzaprine (FLEXERIL) 5 MG tablet Take 5 mg by mouth 3 (three) times daily as needed for muscle spasms.    Marland Kitchen dexamethasone (DECADRON) 4 MG tablet Take 8 mg by mouth 2 (two) times daily. Pt is to take the day before chemo(Taxotere).    . famotidine (PEPCID) 20 MG tablet Take 1 tablet (20 mg total) by mouth 2 (two) times daily. 60 tablet 5  . fentaNYL (DURAGESIC - DOSED MCG/HR) 75 MCG/HR Place 1 patch (75 mcg total) onto the skin every 3 (three) days. 10 patch 0  . lidocaine-prilocaine (EMLA) cream Apply 1 application topically as needed. (Patient taking differently: Apply 1 application topically as needed (prior to accessing port). ) 30 g 3  . loratadine (CLARITIN) 10 MG tablet Take 1 tablet (10 mg total) by mouth daily. 30 tablet 5  . mirtazapine (REMERON) 15 MG tablet Take 15  mg by mouth at bedtime.    . Multiple Vitamin (MULTIVITAMIN WITH MINERALS) TABS tablet Take 1 tablet by mouth daily.    . ondansetron (ZOFRAN) 4 MG tablet Take 1 tablet (4 mg total) by mouth every 6 (six) hours as needed. (Patient taking differently: Take 4 mg by mouth every 6 (six) hours as needed for nausea or vomiting. ) 30 tablet 3  . oxyCODONE (ROXICODONE) 15 MG immediate release tablet Take 2 tablets (30 mg total) by mouth every 2 (two) hours as needed for pain. 90 tablet 0  . psyllium (METAMUCIL) 58.6 % powder Take 1 packet by mouth 3 (three) times daily as needed (for constipation).     Marland Kitchen senna-docusate (SENOKOT-S)  8.6-50 MG tablet Take 1 tablet by mouth 2 (two) times daily.    . sucralfate (CARAFATE) 1 G tablet Take 1 tablet (1 g total) by mouth 4 (four) times daily -  with meals and at bedtime. 90 tablet 3  . warfarin (COUMADIN) 5 MG tablet Take 1.5 tablets (7.5 mg total) by mouth daily at 6 PM. 30 tablet 1  . fentaNYL (DURAGESIC - DOSED MCG/HR) 25 MCG/HR patch Place 1 patch (25 mcg total) onto the skin every 3 (three) days. To use with 92mg for total dose 1022m. 7 patch 0  . gabapentin (NEURONTIN) 300 MG capsule Take 1 capsule (300 mg total) by mouth 2 (two) times daily. 60 capsule 3   No current facility-administered medications for this visit.   Facility-Administered Medications Ordered in Other Visits  Medication Dose Route Frequency Provider Last Rate Last Dose  . sodium chloride 0.9 % injection 10 mL  10 mL Intracatheter PRN JaForest GleasonMD   10 mL at 11/10/14 0837    OBJECTIVE:  Filed Vitals:   12/02/14 1008  BP: 89/61  Pulse: 105  Temp: 96.9 F (36.1 C)     Body mass index is 28.9 kg/(m^2).    ECOG FS:1 - Symptomatic but completely ambulatory  PHYSICAL EXAM:  general status: Patient is feeling weak and tired.  No change in a performance status.  No chills.  No fever. HEENT: Alopecia.  No evidence of stomatitis Lungs: No cough or shortness of breath Cardiac: No  chest pain or paroxysmal nocturnal dyspnea GI: No nausea no vomiting no diarrhea no abdominal pain Skin: No rash Lower extremity no swelling Neurological system: No tingling.  No numbness.  No other focal signs Musculoskeletal system : Increasing pain not able to bear weight on the l left lower extremity. Difficulty in ambulation Recent hospitalization with neutropenia and fever  NEUROLOGICAL: As per history of present illness weakness in the left lower extremity PSYCH:  Less depressed LAB RESULTS:  Appointment on 12/01/2014  Component Date Value Ref Range Status  . WBC 12/01/2014 3.0* 3.6 - 11.0 K/uL Final  . RBC 12/01/2014 3.17* 3.80 - 5.20 MIL/uL Final  . Hemoglobin 12/01/2014 8.9* 12.0 - 16.0 g/dL Final  . HCT 12/01/2014 27.3* 35.0 - 47.0 % Final  . MCV 12/01/2014 86.3  80.0 - 100.0 fL Final  . MCH 12/01/2014 28.1  26.0 - 34.0 pg Final  . MCHC 12/01/2014 32.6  32.0 - 36.0 g/dL Final  . RDW 12/01/2014 16.7* 11.5 - 14.5 % Final  . Platelets 12/01/2014 65* 150 - 440 K/uL Final  . Neutrophils Relative % 12/01/2014 57   Final  . Neutro Abs 12/01/2014 1.8  1.4 - 6.5 K/uL Final  . Lymphocytes Relative 12/01/2014 32   Final  . Lymphs Abs 12/01/2014 1.0  1.0 - 3.6 K/uL Final  . Monocytes Relative 12/01/2014 10   Final  . Monocytes Absolute 12/01/2014 0.3  0.2 - 0.9 K/uL Final  . Eosinophils Relative 12/01/2014 0   Final  . Eosinophils Absolute 12/01/2014 0.0  0 - 0.7 K/uL Final  . Basophils Relative 12/01/2014 1   Final  . Basophils Absolute 12/01/2014 0.0  0 - 0.1 K/uL Final  . Prothrombin Time 12/01/2014 20.0* 11.4 - 15.0 seconds Final  . 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  Patient was asked to increase oxycodone for breakthrough pain  All the records from hospitalization has been reviewed.  Patient would be evaluated by radiation oncologist after bone scan has been done   Patient expressed understanding and was in agreement with this plan. She also understands that She can call clinic at any time with any questions, concerns, or complaints.    Primary cancer of endometrium   Staging form: Corpus Uteri - Carcinoma, AJCC 7th Edition     Clinical: Stage IVB (T1a, N0, M1) - Signed by Forest Gleason, MD on 05/24/2014     Pathologic: No stage assigned - Marni Griffon, MD   12/06/2014 10:33 AM

## 2014-12-08 ENCOUNTER — Encounter: Payer: Self-pay | Admitting: Family Medicine

## 2014-12-08 ENCOUNTER — Encounter: Payer: Self-pay | Admitting: Oncology

## 2014-12-08 ENCOUNTER — Inpatient Hospital Stay: Payer: No Typology Code available for payment source | Attending: Oncology | Admitting: Oncology

## 2014-12-08 ENCOUNTER — Inpatient Hospital Stay: Payer: No Typology Code available for payment source

## 2014-12-08 VITALS — BP 101/68 | HR 96 | Temp 97.6°F | Wt 165.4 lb

## 2014-12-08 VITALS — BP 103/69 | HR 90 | Resp 20

## 2014-12-08 DIAGNOSIS — R5383 Other fatigue: Secondary | ICD-10-CM | POA: Diagnosis not present

## 2014-12-08 DIAGNOSIS — M25561 Pain in right knee: Secondary | ICD-10-CM | POA: Diagnosis not present

## 2014-12-08 DIAGNOSIS — R188 Other ascites: Secondary | ICD-10-CM | POA: Diagnosis not present

## 2014-12-08 DIAGNOSIS — K219 Gastro-esophageal reflux disease without esophagitis: Secondary | ICD-10-CM | POA: Diagnosis not present

## 2014-12-08 DIAGNOSIS — Z86711 Personal history of pulmonary embolism: Secondary | ICD-10-CM | POA: Insufficient documentation

## 2014-12-08 DIAGNOSIS — R000 Tachycardia, unspecified: Secondary | ICD-10-CM | POA: Insufficient documentation

## 2014-12-08 DIAGNOSIS — Z7689 Persons encountering health services in other specified circumstances: Secondary | ICD-10-CM | POA: Diagnosis not present

## 2014-12-08 DIAGNOSIS — C786 Secondary malignant neoplasm of retroperitoneum and peritoneum: Secondary | ICD-10-CM | POA: Insufficient documentation

## 2014-12-08 DIAGNOSIS — Z7901 Long term (current) use of anticoagulants: Secondary | ICD-10-CM | POA: Diagnosis not present

## 2014-12-08 DIAGNOSIS — Z79899 Other long term (current) drug therapy: Secondary | ICD-10-CM | POA: Diagnosis not present

## 2014-12-08 DIAGNOSIS — Z8669 Personal history of other diseases of the nervous system and sense organs: Secondary | ICD-10-CM | POA: Insufficient documentation

## 2014-12-08 DIAGNOSIS — E860 Dehydration: Secondary | ICD-10-CM | POA: Diagnosis not present

## 2014-12-08 DIAGNOSIS — C779 Secondary and unspecified malignant neoplasm of lymph node, unspecified: Secondary | ICD-10-CM | POA: Diagnosis not present

## 2014-12-08 DIAGNOSIS — M25552 Pain in left hip: Secondary | ICD-10-CM | POA: Insufficient documentation

## 2014-12-08 DIAGNOSIS — N289 Disorder of kidney and ureter, unspecified: Secondary | ICD-10-CM | POA: Insufficient documentation

## 2014-12-08 DIAGNOSIS — Z5111 Encounter for antineoplastic chemotherapy: Secondary | ICD-10-CM | POA: Insufficient documentation

## 2014-12-08 DIAGNOSIS — Z170 Estrogen receptor positive status [ER+]: Secondary | ICD-10-CM | POA: Diagnosis not present

## 2014-12-08 DIAGNOSIS — M129 Arthropathy, unspecified: Secondary | ICD-10-CM | POA: Diagnosis not present

## 2014-12-08 DIAGNOSIS — I959 Hypotension, unspecified: Secondary | ICD-10-CM | POA: Insufficient documentation

## 2014-12-08 DIAGNOSIS — D709 Neutropenia, unspecified: Secondary | ICD-10-CM | POA: Insufficient documentation

## 2014-12-08 DIAGNOSIS — K7460 Unspecified cirrhosis of liver: Secondary | ICD-10-CM | POA: Insufficient documentation

## 2014-12-08 DIAGNOSIS — R197 Diarrhea, unspecified: Secondary | ICD-10-CM | POA: Diagnosis not present

## 2014-12-08 DIAGNOSIS — C541 Malignant neoplasm of endometrium: Secondary | ICD-10-CM | POA: Diagnosis not present

## 2014-12-08 DIAGNOSIS — M545 Low back pain: Secondary | ICD-10-CM

## 2014-12-08 DIAGNOSIS — R531 Weakness: Secondary | ICD-10-CM | POA: Diagnosis not present

## 2014-12-08 DIAGNOSIS — C7951 Secondary malignant neoplasm of bone: Secondary | ICD-10-CM | POA: Diagnosis not present

## 2014-12-08 DIAGNOSIS — I10 Essential (primary) hypertension: Secondary | ICD-10-CM | POA: Insufficient documentation

## 2014-12-08 DIAGNOSIS — Z923 Personal history of irradiation: Secondary | ICD-10-CM | POA: Insufficient documentation

## 2014-12-08 DIAGNOSIS — M25562 Pain in left knee: Secondary | ICD-10-CM | POA: Insufficient documentation

## 2014-12-08 LAB — CBC WITH DIFFERENTIAL/PLATELET
BASOS ABS: 0 10*3/uL (ref 0–0.1)
BASOS PCT: 1 %
EOS ABS: 0 10*3/uL (ref 0–0.7)
Eosinophils Relative: 0 %
HCT: 28.9 % — ABNORMAL LOW (ref 35.0–47.0)
HEMOGLOBIN: 9.3 g/dL — AB (ref 12.0–16.0)
Lymphocytes Relative: 28 %
Lymphs Abs: 0.7 10*3/uL — ABNORMAL LOW (ref 1.0–3.6)
MCH: 29.4 pg (ref 26.0–34.0)
MCHC: 32.2 g/dL (ref 32.0–36.0)
MCV: 91.1 fL (ref 80.0–100.0)
Monocytes Absolute: 0 10*3/uL — ABNORMAL LOW (ref 0.2–0.9)
Monocytes Relative: 1 %
NEUTROS ABS: 1.7 10*3/uL (ref 1.4–6.5)
NEUTROS PCT: 70 %
Platelets: 200 10*3/uL (ref 150–440)
RBC: 3.17 MIL/uL — AB (ref 3.80–5.20)
RDW: 19.6 % — ABNORMAL HIGH (ref 11.5–14.5)
WBC: 2.4 10*3/uL — AB (ref 3.6–11.0)

## 2014-12-08 LAB — COMPREHENSIVE METABOLIC PANEL
ALBUMIN: 2.9 g/dL — AB (ref 3.5–5.0)
ALK PHOS: 101 U/L (ref 38–126)
ALT: 18 U/L (ref 14–54)
ANION GAP: 8 (ref 5–15)
AST: 30 U/L (ref 15–41)
BUN: 10 mg/dL (ref 6–20)
CHLORIDE: 103 mmol/L (ref 101–111)
CO2: 23 mmol/L (ref 22–32)
Calcium: 7.8 mg/dL — ABNORMAL LOW (ref 8.9–10.3)
Creatinine, Ser: 1.05 mg/dL — ABNORMAL HIGH (ref 0.44–1.00)
GFR calc non Af Amer: 56 mL/min — ABNORMAL LOW (ref >60)
Glucose, Bld: 253 mg/dL — ABNORMAL HIGH (ref 65–99)
Potassium: 5 mmol/L (ref 3.5–5.1)
SODIUM: 134 mmol/L — AB (ref 135–145)
Total Bilirubin: 0.6 mg/dL (ref 0.3–1.2)
Total Protein: 6.2 g/dL — ABNORMAL LOW (ref 6.5–8.1)

## 2014-12-08 LAB — PROTIME-INR
INR: 3.57
PROTHROMBIN TIME: 34.9 s — AB (ref 11.4–15.0)

## 2014-12-08 LAB — MAGNESIUM: Magnesium: 1.5 mg/dL — ABNORMAL LOW (ref 1.7–2.4)

## 2014-12-08 MED ORDER — PEGFILGRASTIM 6 MG/0.6ML ~~LOC~~ PSKT
6.0000 mg | PREFILLED_SYRINGE | Freq: Once | SUBCUTANEOUS | Status: AC
  Administered 2014-12-08: 6 mg via SUBCUTANEOUS
  Filled 2014-12-08: qty 0.6

## 2014-12-08 MED ORDER — HEPARIN SOD (PORK) LOCK FLUSH 100 UNIT/ML IV SOLN
500.0000 [IU] | Freq: Once | INTRAVENOUS | Status: AC | PRN
  Administered 2014-12-08: 500 [IU]

## 2014-12-08 MED ORDER — SODIUM CHLORIDE 0.9 % IV SOLN
50.0000 mg/m2 | Freq: Once | INTRAVENOUS | Status: AC
  Administered 2014-12-08: 95 mg via INTRAVENOUS
  Filled 2014-12-08: qty 95

## 2014-12-08 MED ORDER — HEPARIN SOD (PORK) LOCK FLUSH 100 UNIT/ML IV SOLN
500.0000 [IU] | Freq: Once | INTRAVENOUS | Status: AC
  Filled 2014-12-08: qty 5

## 2014-12-08 MED ORDER — DOCETAXEL CHEMO INJECTION 160 MG/16ML
60.0000 mg/m2 | Freq: Once | INTRAVENOUS | Status: AC
  Administered 2014-12-08: 110 mg via INTRAVENOUS
  Filled 2014-12-08: qty 11

## 2014-12-08 MED ORDER — SODIUM CHLORIDE 0.9 % IJ SOLN
10.0000 mL | INTRAMUSCULAR | Status: AC | PRN
  Administered 2014-12-08: 10 mL via INTRAVENOUS
  Filled 2014-12-08: qty 10

## 2014-12-08 MED ORDER — SODIUM CHLORIDE 0.9 % IV SOLN
Freq: Once | INTRAVENOUS | Status: AC
  Administered 2014-12-08: 09:00:00 via INTRAVENOUS
  Filled 2014-12-08: qty 1000

## 2014-12-08 MED ORDER — PALONOSETRON HCL INJECTION 0.25 MG/5ML
0.2500 mg | Freq: Once | INTRAVENOUS | Status: AC
  Administered 2014-12-08: 0.25 mg via INTRAVENOUS
  Filled 2014-12-08: qty 5

## 2014-12-08 MED ORDER — POTASSIUM CHLORIDE 2 MEQ/ML IV SOLN
Freq: Once | INTRAVENOUS | Status: AC
  Administered 2014-12-08: 10:00:00 via INTRAVENOUS
  Filled 2014-12-08: qty 1000

## 2014-12-08 MED ORDER — FOSAPREPITANT DIMEGLUMINE INJECTION 150 MG
Freq: Once | INTRAVENOUS | Status: AC
  Administered 2014-12-08: 12:00:00 via INTRAVENOUS
  Filled 2014-12-08: qty 5

## 2014-12-08 MED ORDER — LEVOFLOXACIN 500 MG PO TABS: 500.0000 mg | ORAL_TABLET | Freq: Every day | ORAL | Status: DC

## 2014-12-08 NOTE — Progress Notes (Signed)
[place] @ [facility] [phone]  Fax:(336) [place]: [birth year]  MR#: [id number]  AOZ#:[id number]  Patient Care Team: Ashok Norris, MD as PCP - General (Family Medicine)  CHIEF COMPLAINT:  Chief Complaint  Patient presents with  . 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 abdominal paracentesis x1 negative for malignant cells(22nd September)  2.PET scan shows extensive disease with lymph node metastases and bone metastases T1 N0 M1 disease biopsy is consistent with adenocarcinoma GYN or lesion.  Most likely from the endometrial cancer.  Estrogen receptor positive.  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 abdominal paracentesis x1 negative for malignant cells(22nd September)  2.PET scan shows extensive disease with lymph node metastases and bone metastases T1 N0 M1 disease biopsy is consistent with adenocarcinoma GYN or lesion.  Most likely from the endometrial cancer.  Estrogen receptor positive.  HER-2/neu receptor pending(October, 2015)  3.patient started on chemotherapy with carboplatinum and Taxolfrom November 06, 2013 4.acute pulmonary embolism February of 2016 on xeralto. 5.  Because of insurance not paying for [place] patient has been switched over to Coumadin (May, 2016) 6.  Now on  maintenance a Avastin (April, 2016)   6.  Progressing disease by CT scan and MRI scan (October, 2016) 7.  His and has finished radiation therapy to the spine    Oncology Flowsheet 11/23/2014 11/24/2014 11/25/2014 11/26/2014 11/27/2014 11/28/2014 11/29/2014  Day, Cycle - - - - - - -  bevacizumab (AVASTIN) IV - - - - - - -  CISplatin (PLATINOL) IV - - - - - - -  denosumab (XGEVA) [place] - - - - - - -  dexamethasone (DECADRON) IV - - - - - - -  diphenhydrAMINE (BENADRYL) PO 25 mg - - - - - -  DOCEtaxel (TAXOTERE) IV - - - - - - -  fosaprepitant (EMEND) IV - - - - - - -  ondansetron (ZOFRAN) IV - - - - - - -  palonosetron (ALOXI) IV - - - - - - -  Tbo-Filgrastim (GRANIX) [place] 480 _0  mcg 480 mcg    INTERVAL HISTORY:  59 year old lady with stage IV endometrial cancer recently admitted in the hospital with neutropenia.  According to patient after radiation therapy patient's pain has not improved.  Constant dull aching pain on the left side in the femur and pelvis.  In has improved after patient was asked to increase fentanyl patch.  Patient is here for ongoing evaluation and treatment consideration.  Had a bone scan which has been reviewed shows increased uptake in the left pelvic area where patient is having increasing pain.  Review of radiation  and record it appears that patient had a radiation therapy in that area.  Patient's myelosuppression is improved.   REVIEW OF SYSTEMS:    general status: Patient is feeling weak and tired.  No change in a performance status.  No chills.  No fever. HEENT:   No evidence of stomatitis Lungs: No cough or shortness of breath Cardiac: No chest pain or paroxysmal nocturnal dyspnea GI: No nausea no vomiting no diarrhea no abdominal pain Skin: No rash Lower extremity no swelling Neurological system: No tingling.  No numbness.  No other focal signs Low back pain and pain radiating down to lower extremity.  Constant dull  aching throbbing pain Recent down increasing fentanyl patch as well as radiation therapy has not helped All other systems reviewed as per history of present illness  PAST MEDICAL HISTORY: Past Medical History  Diagnosis Date  . Cancer ([place])   . Primary cancer of endometrium ([place]) 05/24/2014  . Hypertension   . Knee pain, bilateral   . Arthritis   . Renal insufficiency     Significant History/PMH:   Stage 4 peritoneal Cancer:    Cirrhosis:    GERD:    endometerial cancer:    Arthritis:    Hypertension:    Migraines:    Hysterectomy:    Cholecystectomy:    Dilation and Curretage: with hysteroscopy, Aug 2012 FAMILY HISTORY Family History  Problem Relation Age of Onset  . Breast cancer Sister 63  . Hypertension Sister   . Diabetes Mother   . Hypertension Mother       ADVANCED DIRECTIVES:does have advanced healthcare directive   HEALTH MAINTENANCE: Social History  Substance Use Topics  . Smoking status: Never Smoker   . Smokeless tobacco: None  . Alcohol Use: No    :  Allergies  Allergen Reactions  . Sulfa Antibiotics Anaphylaxis    Current Outpatient Prescriptions  Medication Sig Dispense Refill  . atenolol (TENORMIN) 100 MG tablet Take 1 tablet (100 mg total) by mouth daily. 30 tablet 5  . citalopram (CELEXA) 40 MG tablet Take 1 tablet (40 mg total) by mouth daily. 30 tablet 2  . [facility] SENNA PLUS 8.6-50 MG tablet TAKE 1 TABLET BY MOUTH TWICE A DAY 60 tablet 3  . cyclobenzaprine (FLEXERIL) 5 MG tablet Take 5 mg by mouth 3 (three) times daily as needed for muscle spasms.    Marland Kitchen dexamethasone (DECADRON) 4 MG tablet Take 8 mg by mouth 2 (two) times daily. Pt is to take the day before chemo(Taxotere).    . famotidine (PEPCID) 20 MG tablet Take 1 tablet (20 mg total) by mouth 2 (two) times daily. 60 tablet 5  . fentaNYL (DURAGESIC - DOSED MCG/HR) 25 MCG/HR patch Place 1 patch (25 mcg total) onto the skin every 3 (three) days. To use with 41mg for total dose  109m. 7 patch 0  . fentaNYL (DURAGESIC - DOSED MCG/HR) 75 MCG/HR Place 1 patch (75 mcg total) onto the skin every 3 (three) days. 10 patch 0  . gabapentin (NEURONTIN) 300 MG capsule Take 1 capsule (300 mg total) by mouth 2 (two) times daily. 60 capsule 3  . lidocaine-prilocaine (EMLA) cream Apply 1 application topically as needed. (Patient taking differently: Apply 1 application topically as needed (prior to accessing port). ) 30 g 3  . loratadine (CLARITIN) 10 MG tablet Take 1 tablet (10 mg total) by mouth daily. 30 tablet 5  . mirtazapine (REMERON) 15 MG tablet Take 15 mg by mouth at bedtime.    .Marland Kitchen  Multiple Vitamin (MULTIVITAMIN WITH MINERALS) TABS tablet Take 1 tablet by mouth daily.    . ondansetron (ZOFRAN) 4 MG tablet Take 1 tablet (4 mg total) by mouth every 6 (six) hours as needed. (Patient taking differently: Take 4 mg by mouth every 6 (six) hours as needed for nausea or vomiting. ) 30 tablet 3  . oxyCODONE (ROXICODONE) 15 MG immediate release tablet Take 2 tablets (30 mg total) by mouth every 2 (two) hours as needed for pain. 90 tablet 0  . psyllium (METAMUCIL) 58.6 % powder Take 1 packet by mouth 3 (three) times daily as needed (for constipation).     Marland Kitchen senna-docusate (SENOKOT-S) 8.6-50 MG tablet Take 1 tablet by mouth 2 (two) times daily.    . sucralfate (CARAFATE) 1 G tablet Take 1 tablet (1 g total) by mouth 4 (four) times daily -  with meals and at bedtime. 90 tablet 3  . warfarin (COUMADIN) 5 MG tablet Take 1.5 tablets (7.5 mg total) by mouth daily at 6 PM. 30 tablet 1  . levofloxacin (LEVAQUIN) 500 MG tablet Take 1 tablet (500 mg total) by mouth daily. 5 tablet 0   No current facility-administered medications for this visit.   Facility-Administered Medications Ordered in Other Visits  Medication Dose Route Frequency Provider Last Rate Last Dose  . heparin lock flush 100 unit/mL  500 Units Intravenous Once Forest Gleason, MD      . sodium chloride 0.9 % injection 10 mL  10 mL  Intracatheter PRN Forest Gleason, MD   10 mL at 11/10/14 0837  . sodium chloride 0.9 % injection 10 mL  10 mL Intravenous PRN Forest Gleason, MD   10 mL at 12/08/14 0834    OBJECTIVE:  Filed Vitals:   12/08/14 0847  BP: 101/68  Pulse: 96  Temp: 97.6 F (36.4 C)     Body mass index is 28.37 kg/(m^2).    ECOG FS:1 - Symptomatic but completely ambulatory  PHYSICAL EXAM:  general status: Patient is feeling weak and tired.  No change in a performance status.  No chills.  No fever. HEENT: Alopecia.  No evidence of stomatitis Lungs: No cough or shortness of breath Cardiac: No chest pain or paroxysmal nocturnal dyspnea GI: No nausea no vomiting no diarrhea no abdominal pain Skin: No rash Lower extremity no swelling Neurological system: No tingling.  No numbness.  No other focal signs Musculoskeletal system : Increasing pain not able to bear weight on the l left lower extremity. Difficulty in ambulation Recent hospitalization with neutropenia and fever  NEUROLOGICAL: As per history of present illness weakness in the left lower extremity PSYCH:  Less depressed LAB RESULTS:  Infusion on 12/08/2014  Component Date Value Ref Range Status  . WBC 12/08/2014 2.4* 3.6 - 11.0 K/uL Final   A-LINE DRAW  . RBC 12/08/2014 3.17* 3.80 - 5.20 MIL/uL Final  . Hemoglobin 12/08/2014 9.3* 12.0 - 16.0 g/dL Final  . HCT 12/08/2014 28.9* 35.0 - 47.0 % Final  . MCV 12/08/2014 91.1  80.0 - 100.0 fL Final  . MCH 12/08/2014 29.4  26.0 - 34.0 pg Final  . MCHC 12/08/2014 32.2  32.0 - 36.0 g/dL Final  . RDW 12/08/2014 19.6* 11.5 - 14.5 % Final  . Platelets 12/08/2014 200  150 - 440 K/uL Final  . Neutrophils Relative % 12/08/2014 70   Final  . Neutro Abs 12/08/2014 1.7  1.4 - 6.5 K/uL Final  . Lymphocytes Relative 12/08/2014 28   Final  . Lymphs Abs  12/08/2014 0.7* 1.0 - 3.6 K/uL Final  . Monocytes Relative 12/08/2014 1   Final  . Monocytes Absolute 12/08/2014 0.0* 0.2 - 0.9 K/uL Final  . Eosinophils Relative  12/08/2014 0   Final  . Eosinophils Absolute 12/08/2014 0.0  0 - 0.7 K/uL Final  . Basophils Relative 12/08/2014 1   Final  . 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  Pain is improved after fentanyl patch has been increase Add Neurontin.Emily Zamora has been ordered for complete evaluation to see if there is any further radiation therapy with painful bone metastases which has not been previously radiated can be considered.  2.  Bone scan has been reviewed I will discuss with Dr. Baruch Gouty now regarding possibility of radiation therapy if that area is not included in radiation field 3.  Proceed with chemotherapy with Neulasta and prophylactic antibiotic on day fifth for 5 days with Levaquin Continue extremely well Reassessment of tumor with CEA and CA 125   Patient expressed understanding and was in agreement with this plan. She also understands that She can call clinic at any time with any questions, concerns, or complaints.    Primary cancer of endometrium   Staging form: Corpus Uteri - Carcinoma, AJCC 7th Edition     Clinical: Stage IVB (T1a, N0, M1) - Signed by Forest Gleason, MD on 05/24/2014     Pathologic: No stage assigned - Marni Griffon, MD   12/08/2014 9:07 AM

## 2014-12-09 ENCOUNTER — Encounter: Payer: Self-pay | Admitting: Radiation Oncology

## 2014-12-09 ENCOUNTER — Ambulatory Visit
Admission: RE | Admit: 2014-12-09 | Discharge: 2014-12-09 | Disposition: A | Discharge status: Home / Self Care | Payer: No Typology Code available for payment source | Source: Ambulatory Visit | Attending: Radiation Oncology | Admitting: Radiation Oncology

## 2014-12-09 VITALS — BP 104/69 | HR 95 | Temp 95.7°F | Resp 18

## 2014-12-09 DIAGNOSIS — C7951 Secondary malignant neoplasm of bone: Secondary | ICD-10-CM

## 2014-12-09 LAB — CEA: CEA: 129.5 ng/mL — ABNORMAL HIGH (ref 0.0–4.7)

## 2014-12-09 LAB — CA 125: CA 125: 65.9 U/mL — ABNORMAL HIGH (ref 0.0–38.1)

## 2014-12-09 NOTE — Progress Notes (Signed)
Radiation Oncology Follow up Note  Name: Emily Zamora   Date:   12/09/2014 MRN:  [id number] DOB: 05/18/1955    This 59 y.o. female presents to the clinic today for evaluation of palliative radiation therapy to her left hip in patient with stage IV Demetra cancer.  REFERRING PROVIDER: Forest Gleason, MD  HPI: Patient is a 60 year old female well-known to our department having previously been treated back in October 2 over to her lumbar spine for metastatic stage IV endometrial carcinoma. She's achieved excellent palliation in that region although is having increasing left hip pain. Bone scan shows increased uptake in that region compatible with metastatic disease. She's ambulate with difficulty.. She also has PET scan showing extensive disease and has been treated with chemotherapy and has been on maintenance Avastin. I've asked to evaluate her for palliative radiation therapy to her left hip.  COMPLICATIONS OF TREATMENT: none  FOLLOW UP COMPLIANCE: keeps appointments   PHYSICAL EXAM:  BP 104/69 mmHg  Pulse 95  Temp(Src) 95.7 F (35.4 C)  Resp 18  Wt  Range of motion of the left hip elicits some mild pain. Motor sensory and DTR levels are equal and symmetric in the lower extremities bilaterally. Proprioception is intact. Deep palpation of her spine does not elicit pain. Well-developed well-nourished patient in NAD. HEENT reveals PERLA, EOMI, discs not visualized.  Oral cavity is clear. No oral mucosal lesions are identified. Neck is clear without evidence of cervical or supraclavicular adenopathy. Lungs are clear to A&P. Cardiac examination is essentially unremarkable with regular rate and rhythm without murmur rub or thrill. Abdomen is benign with no organomegaly or masses noted. Motor sensory and DTR levels are equal and symmetric in the upper and lower extremities. Cranial nerves II through XII are grossly intact. Proprioception is intact. No peripheral adenopathy or edema is identified. No  motor or sensory levels are noted. Crude visual fields are within normal range.  RADIOLOGY RESULTS: Bone scan is reviewed compatible above-stated findings  PLAN: Based on increasing pain in this being a weightbearing area plan on palliative radiation therapy to her left hip. Would plan on 3000 cGy in 10 fractions. Risks and benefits of treatment and clearing skin reaction fatigue all were discussed in detail with the patient and her son. I have set up and ordered CT simulation tomorrow.  I would like to take this opportunity for allowing me to participate in the care of your patient.Armstead Peaks., MD

## 2014-12-10 ENCOUNTER — Ambulatory Visit
Admission: RE | Admit: 2014-12-10 | Discharge: 2014-12-10 | Disposition: A | Discharge status: Home / Self Care | Payer: No Typology Code available for payment source | Source: Ambulatory Visit | Attending: Radiation Oncology | Admitting: Radiation Oncology

## 2014-12-10 ENCOUNTER — Ambulatory Visit: Payer: No Typology Code available for payment source | Admitting: Radiation Oncology

## 2014-12-10 DIAGNOSIS — C541 Malignant neoplasm of endometrium: Secondary | ICD-10-CM | POA: Diagnosis not present

## 2014-12-10 DIAGNOSIS — Z90710 Acquired absence of both cervix and uterus: Secondary | ICD-10-CM | POA: Diagnosis not present

## 2014-12-10 DIAGNOSIS — M1990 Unspecified osteoarthritis, unspecified site: Secondary | ICD-10-CM | POA: Diagnosis not present

## 2014-12-10 DIAGNOSIS — Z7901 Long term (current) use of anticoagulants: Secondary | ICD-10-CM | POA: Diagnosis not present

## 2014-12-10 DIAGNOSIS — Z7982 Long term (current) use of aspirin: Secondary | ICD-10-CM | POA: Diagnosis not present

## 2014-12-10 DIAGNOSIS — C7951 Secondary malignant neoplasm of bone: Secondary | ICD-10-CM | POA: Diagnosis not present

## 2014-12-10 DIAGNOSIS — Z510 Encounter for antineoplastic radiation therapy: Secondary | ICD-10-CM | POA: Diagnosis present

## 2014-12-10 DIAGNOSIS — M4804 Spinal stenosis, thoracic region: Secondary | ICD-10-CM | POA: Diagnosis not present

## 2014-12-10 DIAGNOSIS — Z170 Estrogen receptor positive status [ER+]: Secondary | ICD-10-CM | POA: Diagnosis not present

## 2014-12-10 DIAGNOSIS — I10 Essential (primary) hypertension: Secondary | ICD-10-CM | POA: Diagnosis not present

## 2014-12-11 DIAGNOSIS — Z510 Encounter for antineoplastic radiation therapy: Secondary | ICD-10-CM | POA: Diagnosis not present

## 2014-12-12 ENCOUNTER — Other Ambulatory Visit: Payer: Self-pay | Admitting: *Deleted

## 2014-12-15 ENCOUNTER — Inpatient Hospital Stay: Payer: No Typology Code available for payment source

## 2014-12-15 ENCOUNTER — Emergency Department
Admission: EM | Admit: 2014-12-15 | Discharge: 2014-12-15 | Disposition: A | Discharge status: Home / Self Care | Payer: No Typology Code available for payment source | Source: Home / Self Care | Attending: Emergency Medicine | Admitting: Emergency Medicine

## 2014-12-15 ENCOUNTER — Encounter: Payer: Self-pay | Admitting: Oncology

## 2014-12-15 ENCOUNTER — Inpatient Hospital Stay (HOSPITAL_BASED_OUTPATIENT_CLINIC_OR_DEPARTMENT_OTHER): Payer: No Typology Code available for payment source | Admitting: Oncology

## 2014-12-15 ENCOUNTER — Telehealth: Payer: Self-pay | Admitting: *Deleted

## 2014-12-15 ENCOUNTER — Ambulatory Visit: Admission: RE | Admit: 2014-12-15 | Payer: No Typology Code available for payment source | Source: Ambulatory Visit

## 2014-12-15 ENCOUNTER — Encounter: Payer: Self-pay | Admitting: Emergency Medicine

## 2014-12-15 DIAGNOSIS — C779 Secondary and unspecified malignant neoplasm of lymph node, unspecified: Secondary | ICD-10-CM

## 2014-12-15 DIAGNOSIS — M545 Low back pain: Secondary | ICD-10-CM | POA: Diagnosis not present

## 2014-12-15 DIAGNOSIS — K7460 Unspecified cirrhosis of liver: Secondary | ICD-10-CM

## 2014-12-15 DIAGNOSIS — N289 Disorder of kidney and ureter, unspecified: Secondary | ICD-10-CM | POA: Diagnosis not present

## 2014-12-15 DIAGNOSIS — Z7901 Long term (current) use of anticoagulants: Secondary | ICD-10-CM

## 2014-12-15 DIAGNOSIS — M25561 Pain in right knee: Secondary | ICD-10-CM

## 2014-12-15 DIAGNOSIS — R5383 Other fatigue: Secondary | ICD-10-CM

## 2014-12-15 DIAGNOSIS — M129 Arthropathy, unspecified: Secondary | ICD-10-CM

## 2014-12-15 DIAGNOSIS — I959 Hypotension, unspecified: Secondary | ICD-10-CM

## 2014-12-15 DIAGNOSIS — Z8669 Personal history of other diseases of the nervous system and sense organs: Secondary | ICD-10-CM

## 2014-12-15 DIAGNOSIS — D709 Neutropenia, unspecified: Secondary | ICD-10-CM

## 2014-12-15 DIAGNOSIS — R188 Other ascites: Secondary | ICD-10-CM | POA: Diagnosis not present

## 2014-12-15 DIAGNOSIS — R000 Tachycardia, unspecified: Secondary | ICD-10-CM

## 2014-12-15 DIAGNOSIS — R197 Diarrhea, unspecified: Secondary | ICD-10-CM

## 2014-12-15 DIAGNOSIS — C786 Secondary malignant neoplasm of retroperitoneum and peritoneum: Secondary | ICD-10-CM

## 2014-12-15 DIAGNOSIS — C7951 Secondary malignant neoplasm of bone: Secondary | ICD-10-CM | POA: Diagnosis not present

## 2014-12-15 DIAGNOSIS — E860 Dehydration: Secondary | ICD-10-CM | POA: Diagnosis not present

## 2014-12-15 DIAGNOSIS — I10 Essential (primary) hypertension: Secondary | ICD-10-CM | POA: Diagnosis not present

## 2014-12-15 DIAGNOSIS — R531 Weakness: Secondary | ICD-10-CM | POA: Diagnosis not present

## 2014-12-15 DIAGNOSIS — K219 Gastro-esophageal reflux disease without esophagitis: Secondary | ICD-10-CM

## 2014-12-15 DIAGNOSIS — Z923 Personal history of irradiation: Secondary | ICD-10-CM

## 2014-12-15 DIAGNOSIS — Z79899 Other long term (current) drug therapy: Secondary | ICD-10-CM | POA: Diagnosis not present

## 2014-12-15 DIAGNOSIS — C541 Malignant neoplasm of endometrium: Secondary | ICD-10-CM

## 2014-12-15 DIAGNOSIS — R110 Nausea: Secondary | ICD-10-CM

## 2014-12-15 DIAGNOSIS — Z86711 Personal history of pulmonary embolism: Secondary | ICD-10-CM

## 2014-12-15 DIAGNOSIS — Z170 Estrogen receptor positive status [ER+]: Secondary | ICD-10-CM | POA: Diagnosis not present

## 2014-12-15 DIAGNOSIS — M25552 Pain in left hip: Secondary | ICD-10-CM | POA: Diagnosis not present

## 2014-12-15 DIAGNOSIS — Z7689 Persons encountering health services in other specified circumstances: Secondary | ICD-10-CM | POA: Diagnosis not present

## 2014-12-15 DIAGNOSIS — K521 Toxic gastroenteritis and colitis: Secondary | ICD-10-CM | POA: Diagnosis not present

## 2014-12-15 DIAGNOSIS — I9589 Other hypotension: Secondary | ICD-10-CM

## 2014-12-15 DIAGNOSIS — Z5111 Encounter for antineoplastic chemotherapy: Secondary | ICD-10-CM | POA: Diagnosis not present

## 2014-12-15 DIAGNOSIS — M25562 Pain in left knee: Secondary | ICD-10-CM

## 2014-12-15 LAB — COMPREHENSIVE METABOLIC PANEL
ALBUMIN: 3.5 g/dL (ref 3.5–5.0)
ALT: 18 U/L (ref 14–54)
AST: 27 U/L (ref 15–41)
Alkaline Phosphatase: 125 U/L (ref 38–126)
Anion gap: 13 (ref 5–15)
BILIRUBIN TOTAL: 0.8 mg/dL (ref 0.3–1.2)
BUN: 28 mg/dL — AB (ref 6–20)
CHLORIDE: 100 mmol/L — AB (ref 101–111)
CO2: 17 mmol/L — ABNORMAL LOW (ref 22–32)
Calcium: 7.6 mg/dL — ABNORMAL LOW (ref 8.9–10.3)
Creatinine, Ser: 1.25 mg/dL — ABNORMAL HIGH (ref 0.44–1.00)
GFR calc Af Amer: 53 mL/min — ABNORMAL LOW (ref >60)
GFR, EST NON AFRICAN AMERICAN: 45 mL/min — AB (ref >60)
GLUCOSE: 113 mg/dL — AB (ref 65–99)
POTASSIUM: 4.3 mmol/L (ref 3.5–5.1)
Sodium: 130 mmol/L — ABNORMAL LOW (ref 135–145)
TOTAL PROTEIN: 6.6 g/dL (ref 6.5–8.1)

## 2014-12-15 LAB — CBC WITH DIFFERENTIAL/PLATELET
BASOS PCT: 1 %
Basophils Absolute: 0 10*3/uL (ref 0–0.1)
Eosinophils Absolute: 0 10*3/uL (ref 0–0.7)
Eosinophils Relative: 0 %
HEMATOCRIT: 32.8 % — AB (ref 35.0–47.0)
Hemoglobin: 10.8 g/dL — ABNORMAL LOW (ref 12.0–16.0)
LYMPHS PCT: 27 %
Lymphs Abs: 2 10*3/uL (ref 1.0–3.6)
MCH: 28.9 pg (ref 26.0–34.0)
MCHC: 33 g/dL (ref 32.0–36.0)
MCV: 87.4 fL (ref 80.0–100.0)
MONO ABS: 0.9 10*3/uL (ref 0.2–0.9)
MONOS PCT: 13 %
NEUTROS ABS: 4.5 10*3/uL (ref 1.4–6.5)
Neutrophils Relative %: 59 %
Platelets: 119 10*3/uL — ABNORMAL LOW (ref 150–440)
RBC: 3.75 MIL/uL — ABNORMAL LOW (ref 3.80–5.20)
RDW: 20.2 % — AB (ref 11.5–14.5)
WBC: 7.4 10*3/uL (ref 3.6–11.0)

## 2014-12-15 LAB — MAGNESIUM: Magnesium: 1.2 mg/dL — ABNORMAL LOW (ref 1.7–2.4)

## 2014-12-15 MED ORDER — HEPARIN SOD (PORK) LOCK FLUSH 100 UNIT/ML IV SOLN
500.0000 [IU] | Freq: Once | INTRAVENOUS | Status: DC
  Filled 2014-12-15: qty 5

## 2014-12-15 MED ORDER — SODIUM CHLORIDE 0.9 % IV SOLN
8.0000 mg | Freq: Once | INTRAVENOUS | Status: AC
  Administered 2014-12-15: 8 mg via INTRAVENOUS
  Filled 2014-12-15: qty 4

## 2014-12-15 MED ORDER — SODIUM CHLORIDE 0.9 % IV BOLUS (SEPSIS)
1000.0000 mL | Freq: Once | INTRAVENOUS | Status: AC
  Administered 2014-12-15: 1000 mL via INTRAVENOUS

## 2014-12-15 MED ORDER — SODIUM CHLORIDE 0.9 % IJ SOLN
10.0000 mL | INTRAMUSCULAR | Status: AC | PRN
  Administered 2014-12-15: 10 mL via INTRAVENOUS
  Filled 2014-12-15: qty 10

## 2014-12-15 MED ORDER — HEPARIN SOD (PORK) LOCK FLUSH 100 UNIT/ML IV SOLN: 500.0000 [IU] | Freq: Once | INTRAVENOUS | Status: AC

## 2014-12-15 MED ORDER — SODIUM CHLORIDE 0.9 % IV SOLN
Freq: Once | INTRAVENOUS | Status: AC
  Filled 2014-12-15: qty 1000

## 2014-12-15 NOTE — Discharge Instructions (Signed)
Your heart rate has eased a little bit with the IV fluids you received. Continue to sip on fluids through the evening. Taking any antinausea medicine as needed. See Dr. Oliva Bustard tomorrow morning at 8:30. Return to the emergency department if you feel worse, weaker, or have other urgent concerns.  Diarrhea Diarrhea is watery poop (stool). It can make you feel weak, tired, thirsty, or give you a dry mouth (signs of dehydration). Watery poop is a sign of another problem, most often an infection. It often lasts 2-3 days. It can last longer if it is a sign of something serious. Take care of yourself as told by your doctor. HOME CARE   Drink 1 cup (8 ounces) of fluid each time you have watery poop.  Do not drink the following fluids:  Those that contain simple sugars (fructose, glucose, galactose, lactose, sucrose, maltose).  Sports drinks.  Fruit juices.  Whole milk products.  Sodas.  Drinks with caffeine (coffee, tea, soda) or alcohol.  Oral rehydration solution may be used if the doctor says it is okay. You may make your own solution. Follow this recipe:   - teaspoon table salt.   teaspoon baking soda.   teaspoon salt substitute containing potassium chloride.  1 tablespoons sugar.  1 liter (34 ounces) of water.  Avoid the following foods:  High fiber foods, such as raw fruits and vegetables.  Nuts, seeds, and whole grain breads and cereals.   Those that are sweetened with sugar alcohols (xylitol, sorbitol, mannitol).  Try eating the following foods:  Starchy foods, such as rice, toast, pasta, low-sugar cereal, oatmeal, baked potatoes, crackers, and bagels.  Bananas.  Applesauce.  Eat probiotic-rich foods, such as yogurt and milk products that are fermented.  Wash your hands well after each time you have watery poop.  Only take medicine as told by your doctor.  Take a warm bath to help lessen burning or pain from having watery poop. GET HELP RIGHT AWAY IF:   You  cannot drink fluids without throwing up (vomiting).  You keep throwing up.  You have blood in your poop, or your poop looks black and tarry.  You do not pee (urinate) in 6-8 hours, or there is only a small amount of very dark pee.  You have belly (abdominal) pain that gets worse or stays in the same spot (localizes).  You are weak, dizzy, confused, or light-headed.  You have a very bad headache.  Your watery poop gets worse or does not get better.  You have a fever or lasting symptoms for more than 2-3 days.  You have a fever and your symptoms suddenly get worse. MAKE SURE YOU:   Understand these instructions.  Will watch your condition.  Will get help right away if you are not doing well or get worse.   This information is not intended to replace advice given to you by your health care provider. Make sure you discuss any questions you have with your health care provider.   Document Released: 06/08/2007 Document Revised: 01/10/2014 Document Reviewed: 08/28/2011 Elsevier Interactive Patient Education [employer].

## 2014-12-15 NOTE — ED Provider Notes (Signed)
[facility] Emergency Department Provider Note  ____________________________________________  Time seen: 1515  I have reviewed the triage vital signs and the nursing notes.  History by:  Patient and family member  HISTORY  Chief Complaint Hypotension  nausea and weakness status post chemotherapy    HPI Emily Zamora is a 59 y.o. female who has metastatic disease to her bones. She initially was diagnosed with endometrial cancer in 2012 when she had a hysterectomy.  Patient had chemotherapy last week. She often impacted hard by the chemotherapy and has needed to be admitted to the hospital in the past. She went to the cancer Center this morning complaining of ongoing diarrhea and weakness. She was sent to the emergency department for further evaluation.  Patient denies any nausea at this time, that she has had nausea and she was vomiting last night. She reports that she has a lot of diarrhea and is unable to keep up with the fluid intake that she believe she needs.  She denies any abdominal pain. She does have some pain in her pelvis, where she has a known fracture from a lytic lesion.    Past Medical History  Diagnosis Date  . Cancer ([place])   . Primary cancer of endometrium ([place]) 05/24/2014  . Hypertension   . Knee pain, bilateral   . Arthritis   . Renal insufficiency     Patient Active Problem List   Diagnosis Date Noted  . Neutropenic fever ([place]) 11/21/2014  . Diarrhea 11/21/2014  . Right knee pain 08/27/2014  . Primary cancer of endometrium ([place]) 05/24/2014  . Endometrial cancer ([place]) 05/24/2014    Past Surgical History  Procedure Laterality Date  . Gallbladder surgery  2014  . Abdominal hysterectomy      Current Outpatient Rx  Name  Route  Sig  Dispense  Refill  . atenolol (TENORMIN) 100 MG tablet   Oral   Take 1 tablet (100 mg total) by mouth daily.   30 tablet   5     May get 1 month refill then must be seen before an ...   .  citalopram (CELEXA) 40 MG tablet   Oral   Take 1 tablet (40 mg total) by mouth daily.   30 tablet   2   . cyclobenzaprine (FLEXERIL) 5 MG tablet   Oral   Take 5 mg by mouth 3 (three) times daily as needed for muscle spasms.         Marland Kitchen dexamethasone (DECADRON) 4 MG tablet   Oral   Take 8 mg by mouth 2 (two) times daily. Pt is to take the day before chemo(Taxotere).         . famotidine (PEPCID) 20 MG tablet   Oral   Take 1 tablet (20 mg total) by mouth 2 (two) times daily.   60 tablet   5   . fentaNYL (DURAGESIC - DOSED MCG/HR) 25 MCG/HR patch   Transdermal   Place 25 mcg onto the skin every 3 (three) days. Pt uses with a 66mcg patch.         . fentaNYL (DURAGESIC - DOSED MCG/HR) 75 MCG/HR   Transdermal   Place 75 mcg onto the skin every 3 (three) days. Pt uses with a 74mcg patch.         . gabapentin (NEURONTIN) 300 MG capsule   Oral   Take 1 capsule (300 mg total) by mouth 2 (two) times daily.   60 capsule   3   .  levofloxacin (LEVAQUIN) 500 MG tablet   Oral   Take 1 tablet (500 mg total) by mouth daily.   5 tablet   0   . lidocaine-prilocaine (EMLA) cream   Topical   Apply 1 application topically as needed. Patient taking differently: Apply 1 application topically as needed (prior to accessing port).    30 g   3   . loratadine (CLARITIN) 10 MG tablet   Oral   Take 1 tablet (10 mg total) by mouth daily.   30 tablet   5   . mirtazapine (REMERON) 15 MG tablet   Oral   Take 15 mg by mouth at bedtime.         . Multiple Vitamin (MULTIVITAMIN WITH MINERALS) TABS tablet   Oral   Take 1 tablet by mouth daily.         . ondansetron (ZOFRAN) 4 MG tablet   Oral   Take 1 tablet (4 mg total) by mouth every 6 (six) hours as needed. Patient taking differently: Take 4 mg by mouth every 6 (six) hours as needed for nausea or vomiting.    30 tablet   3   . oxyCODONE (ROXICODONE) 15 MG immediate release tablet   Oral   Take 2 tablets (30 mg total) by  mouth every 2 (two) hours as needed for pain.   90 tablet   0   . psyllium (METAMUCIL) 58.6 % powder   Oral   Take 1 packet by mouth 3 (three) times daily as needed (for constipation).          Marland Kitchen senna-docusate (SENOKOT-S) 8.6-50 MG tablet   Oral   Take 1 tablet by mouth 2 (two) times daily as needed for mild constipation.         . sucralfate (CARAFATE) 1 G tablet   Oral   Take 1 tablet (1 g total) by mouth 4 (four) times daily -  with meals and at bedtime.   90 tablet   3   . warfarin (COUMADIN) 5 MG tablet   Oral   Take 1.5 tablets (7.5 mg total) by mouth daily at 6 PM.   30 tablet   1     Allergies Sulfa antibiotics  Family History  Problem Relation Age of Onset  . Breast cancer Sister 54  . Hypertension Sister   . Diabetes Mother   . Hypertension Mother     Social History Social History  Substance Use Topics  . Smoking status: Never Smoker   . Smokeless tobacco: None  . Alcohol Use: No    Review of Systems  Constitutional: Negative for fever/chills. ENT: Negative for congestion. Cardiovascular: Negative for chest pain. Respiratory: Negative for cough. Gastrointestinal: Negative for abdominal pain. Positive for nausea and vomiting last night and diarrhea. See history of present illness  Genitourinary: Negative for dysuria. Musculoskeletal: No back pain. Skin: Negative for rash. Neurological: Negative for headache or focal weakness   10-point ROS otherwise negative.  ____________________________________________   PHYSICAL EXAM:  VITAL SIGNS: ED Triage Vitals  Enc Vitals Group     BP 12/15/14 1430 100/81 mmHg     Pulse Rate 12/15/14 1430 110     Resp 12/15/14 1430 16     Temp 12/15/14 1430 98.2 F (36.8 C)     Temp Source 12/15/14 1430 Oral     SpO2 12/15/14 1430 100 %     Weight 12/15/14 1430 158 lb (71.668 kg)     Height 12/15/14 1430 5\' 4"  (  1.626 m)     Head Cir --      Peak Flow --      Pain Score 12/15/14 1431 0     Pain Loc  --      Pain Edu? --      Excl. in [place]? --     Constitutional: Alert and oriented. No distress. ENT   Head: Normocephalic and atraumatic.   Nose: No congestion/rhinnorhea.       Mouth: No erythema, no swelling   Cardiovascular: Tachycardia at 113, regular rhythm, no murmur noted Respiratory:  Normal respiratory effort, no tachypnea.    Breath sounds are clear and equal bilaterally.  Gastrointestinal: Soft, no distention. Nontender Back: No muscle spasm, no tenderness, no CVA tenderness. Musculoskeletal: No deformity noted. Nontender with normal range of motion in all extremities.  No noted edema. Neurologic:  Communicative. Normal appearing spontaneous movement in all 4 extremities. No gross focal neurologic deficits are appreciated.  Skin:  Somewhat pale. Skin is warm, dry. No rash noted. Psychiatric: Mood and affect are normal. Speech and behavior are normal.  ____________________________________________    LABS (pertinent positives/negatives)  Labs Reviewed  URINALYSIS COMPLETEWITH MICROSCOPIC ([place])   Labs from the cancer center this morning are reviewed. White blood cell count of 7k. Electrolytes within reason. BUN is slightly elevated at 28 with creatinine of 1.25.  ____________________________________________   EKG  ED ECG REPORT I, Evita Merida W, the attending physician, personally viewed and interpreted this ECG.   Date: 12/15/2014  EKG Time: 1600  Rate: 102  Rhythm: Sinus tachycardia  Axis: Normal  Intervals: Normal  ST&T Change: None noted  ____________________________________________   INITIAL IMPRESSION / ASSESSMENT AND PLAN / ED COURSE  Pertinent labs & imaging results that were available during my care of the patient were reviewed by me and considered in my medical decision making (see chart for details).  59 year old female with metastatic cancer to her bones, status post chemotherapy, with ongoing diarrhea, slightly worsened renal  function, tachycardia, sent from the cancer clinic. We will treat her with IV fluid and antinausea medicine. We will try to reach Dr. Oliva Bustard.   ----------------------------------------- 5:22 PM on 12/15/2014 -----------------------------------------  Patient heart rate has eased some. She is having no nausea now. She does not feel it. She overall feels comfortable. She preferred not to stay in the hospital. I have called and spoke with Dr. Oliva Bustard. He agrees with the patient going home and will see her tomorrow morning at 8:30.  ____________________________________________   FINAL CLINICAL IMPRESSION(S) / ED DIAGNOSES  Final diagnoses:  Diarrhea, unspecified type  Nausea   metastatic cancer   Ahmed Prima, MD 12/15/14 1725

## 2014-12-15 NOTE — ED Notes (Signed)
Pt states last chemo treatment one week ago.

## 2014-12-15 NOTE — ED Notes (Signed)
Pt sent from cancer center, there for usual apt but feeling weak and they were unable to get a bp.  Pt alert, skin w/d.

## 2014-12-15 NOTE — Progress Notes (Signed)
[place] @ [facility] [phone]  [phone] [place]: [birth year]  MR#: [id number]  JHE#:[id number]  Patient Care Team: Ashok Norris, MD as PCP - General (Family Medicine)  CHIEF COMPLAINT:  No chief complaint on file.   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 abdominal paracentesis x1 negative for malignant cells(22nd September)  2.PET scan shows extensive disease with lymph node metastases and bone metastases T1 N0 M1 disease biopsy is consistent with adenocarcinoma GYN or lesion.  Most likely from the endometrial cancer.  Estrogen receptor positive.  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 abdominal paracentesis x1 negative for malignant cells(22nd September)  2.PET scan shows extensive disease with lymph node metastases and bone metastases T1 N0 M1 disease biopsy is consistent with adenocarcinoma GYN or lesion.  Most likely from the endometrial cancer.  Estrogen receptor positive.  HER-2/neu receptor pending(October, 2015)  3.patient started on chemotherapy with carboplatinum and Taxolfrom November 06, 2013 4.acute pulmonary embolism February of 2016 on xeralto. 5.  Because of insurance not paying for [place] patient has been switched over to Coumadin (May, 2016) 6.  Now on maintenance a Avastin (April, 2016)   6.  Progressing disease by CT scan and MRI scan (October, 2016) 7.  His and has finished radiation therapy to the spine    Oncology Flowsheet 11/25/2014 11/26/2014 11/27/2014 11/28/2014 11/29/2014 12/08/2014 12/15/2014  Day, Cycle - - - - - Day 1, Cycle 2 -  bevacizumab (AVASTIN) IV - - - - - - -  CISplatin (PLATINOL) IV - - - - - 50 mg/m2 -  denosumab (XGEVA) [place] - - - - - - -  dexamethasone (DECADRON) IV - - - - - [ 12 mg ] -  diphenhydrAMINE (BENADRYL) PO - - - - - - -  DOCEtaxel (TAXOTERE) IV - - - - - 60 mg/m2 -  fosaprepitant (EMEND) IV - - - - - [ 150 mg ] -  ondansetron (ZOFRAN) IV - - - - - - 8 mg  palonosetron (ALOXI) IV - - - - - 0.25 mg -  pegfilgrastim (NEULASTA ONPRO KIT) [place] - - - - - 6 mg -  Tbo-Filgrastim (GRANIX) [place] 480 mcg 480 mcg 480 mcg 480 mcg 480 mcg - -    INTERVAL HISTORY:  59 year old lady with stage IV endometrial cancer recently admitted in the hospital with neutropenia.  According to patient after radiation therapy patient's pain has not improved.  Constant dull aching pain on the left side in the femur and pelvis.  In has improved after patient was asked to increase fentanyl patch.  Patient is here for ongoing evaluation and treatment consideration.  Had a bone scan which has been reviewed shows  increased uptake in the left pelvic area where patient is having increasing pain.  Patient came as an acute add-on with diarrhea which started last Saturday.  Patient is feeling extremely weak and tired cold and clammy.  During evaluation in infusion place the blood pressure was very low and not recordable patient was tachycardic. Denies any fever.   REVIEW OF SYSTEMS:   Gen. Status: Patient was feeling extremely weak and tired.  No chills fever cold and clammy. GI: Diarrhea as described about No rectal bleeding.  No abdominal pain.  GU no dysuria hematuria. Neurological system no headache no dizziness. GU: No dysuria hematuria musculoskeletal system no bony pain all  other  12  systems have been reviewed  PAST MEDICAL HISTORY: Past Medical History  Diagnosis Date  . Cancer ([place])   . Primary cancer of endometrium ([place]) 05/24/2014  . Hypertension   . Knee pain, bilateral   . Arthritis   . Renal insufficiency     Significant History/PMH:   Stage 4 peritoneal Cancer:    Cirrhosis:    GERD:    endometerial cancer:    Arthritis:    Hypertension:    Migraines:    Hysterectomy:    Cholecystectomy:    Dilation and Curretage: with hysteroscopy, Aug 2012 FAMILY HISTORY Family History  Problem Relation Age of Onset  . Breast cancer Sister 70  . Hypertension Sister   . Diabetes Mother   . Hypertension Mother       ADVANCED DIRECTIVES:does have advanced healthcare directive   HEALTH MAINTENANCE: Social History  Substance Use Topics  . Smoking status: Never Smoker   . Smokeless tobacco: None  . Alcohol Use: No    :  Allergies  Allergen Reactions  . Sulfa Antibiotics Anaphylaxis    No current facility-administered medications for this visit.   Current Outpatient Prescriptions  Medication Sig Dispense Refill  . atenolol (TENORMIN) 100 MG tablet Take 1 tablet (100 mg total) by mouth daily. 30 tablet 5  . citalopram (CELEXA) 40 MG tablet Take 1 tablet (40 mg total) by mouth daily. 30 tablet 2  . [facility] SENNA PLUS 8.6-50 MG tablet TAKE 1 TABLET BY MOUTH TWICE A DAY 60 tablet 3  . cyclobenzaprine (FLEXERIL) 5 MG tablet Take 5 mg by mouth 3 (three) times daily as needed for muscle spasms.    Marland Kitchen dexamethasone (DECADRON) 4 MG tablet Take 8 mg by mouth 2 (two) times daily. Pt is to take the day before chemo(Taxotere).    . famotidine (PEPCID) 20 MG tablet Take 1 tablet (20 mg total) by mouth 2 (two) times daily. 60 tablet 5  . fentaNYL (DURAGESIC - DOSED MCG/HR) 25 MCG/HR patch Place 1 patch (25 mcg total) onto the skin every 3 (three) days. To use with 28mg for total dose 1055m. 7 patch 0  . fentaNYL (DURAGESIC - DOSED MCG/HR) 75  MCG/HR Place 1 patch (75 mcg total) onto the skin every 3 (three) days. 10 patch 0  . gabapentin (NEURONTIN) 300 MG capsule Take 1 capsule (300 mg total) by mouth 2 (two) times daily. 60 capsule 3  . levofloxacin (LEVAQUIN) 500 MG tablet Take 1 tablet (500 mg total) by mouth daily. 5 tablet 0  . lidocaine-prilocaine (EMLA) cream Apply 1 application topically as needed. (Patient taking differently: Apply 1 application topically as needed (prior to accessing port). ) 30 g 3  . loratadine (CLARITIN) 10 MG tablet Take 1 tablet (10 mg total) by mouth daily. 30 tablet 5  .  mirtazapine (REMERON) 15 MG tablet Take 15 mg by mouth at bedtime.    . Multiple Vitamin (MULTIVITAMIN WITH MINERALS) TABS tablet Take 1 tablet by mouth daily.    . ondansetron (ZOFRAN) 4 MG tablet Take 1 tablet (4 mg total) by mouth every 6 (six) hours as needed. (Patient taking differently: Take 4 mg by mouth every 6 (six) hours as needed for nausea or vomiting. ) 30 tablet 3  . oxyCODONE (ROXICODONE) 15 MG immediate release tablet Take 2 tablets (30 mg total) by mouth every 2 (two) hours as needed for pain. 90 tablet 0  . psyllium (METAMUCIL) 58.6 % powder Take 1 packet by mouth 3 (three) times daily as needed (for constipation).     Marland Kitchen senna-docusate (SENOKOT-S) 8.6-50 MG tablet Take 1 tablet by mouth 2 (two) times daily.    . sucralfate (CARAFATE) 1 G tablet Take 1 tablet (1 g total) by mouth 4 (four) times daily -  with meals and at bedtime. 90 tablet 3  . warfarin (COUMADIN) 5 MG tablet Take 1.5 tablets (7.5 mg total) by mouth daily at 6 PM. 30 tablet 1   Facility-Administered Medications Ordered in Other Visits  Medication Dose Route Frequency Provider Last Rate Last Dose  . 0.9 %  sodium chloride infusion   Intravenous Once Forest Gleason, MD      . heparin lock flush 100 unit/mL  500 Units Intravenous Once Forest Gleason, MD      . heparin lock flush 100 unit/mL  500 Units Intravenous Once Forest Gleason, MD      . heparin lock  flush 100 unit/mL  500 Units Intravenous Once Ahmed Prima, MD      . sodium chloride 0.9 % bolus 1,000 mL  1,000 mL Intravenous Once Ahmed Prima, MD      . sodium chloride 0.9 % injection 10 mL  10 mL Intracatheter PRN Forest Gleason, MD   10 mL at 11/10/14 0837  . sodium chloride 0.9 % injection 10 mL  10 mL Intravenous PRN Forest Gleason, MD   10 mL at 12/08/14 0834  . sodium chloride 0.9 % injection 10 mL  10 mL Intravenous PRN Forest Gleason, MD   10 mL at 12/15/14 1417    OBJECTIVE:  There were no vitals filed for this visit.   There is no weight on file to calculate BMI.    ECOG FS:1 - Symptomatic but completely ambulatory  PHYSICAL EXAM:  general status: Patient is feeling weak and tired.  No change in a performance status.  No chills.  No fever. HEENT: Alopecia.  No evidence of stomatitis Lungs: No cough or shortness of breath Cardiac: No chest pain or paroxysmal nocturnal dyspnea GI: No nausea no vomiting no diarrhea no abdominal pain Skin: No rash Lower extremity no swelling Neurological system: No tingling.  No numbness.  No other focal signs Musculoskeletal system : Increasing pain not able to bear weight on the l left lower extremity. Difficulty in ambulation Recent hospitalization with neutropenia and fever  NEUROLOGICAL: As per history of present illness weakness in the left lower extremity PSYCH:  Less depressed LAB RESULTS:  Infusion on 12/15/2014  Component Date Value Ref Range Status  . Sodium 12/15/2014 130* 135 - 145 mmol/L Final  . Potassium 12/15/2014 4.3  3.5 - 5.1 mmol/L Final  . Chloride 12/15/2014 100* 101 - 111 mmol/L Final  . CO2 12/15/2014 17* 22 - 32 mmol/L Final  . Glucose, Bld 12/15/2014 113* 65 - 99  mg/dL Final  . BUN 12/15/2014 28* 6 - 20 mg/dL Final  . Creatinine, Ser 12/15/2014 1.25* 0.44 - 1.00 mg/dL Final  . Calcium 12/15/2014 7.6* 8.9 - 10.3 mg/dL Final  . Total Protein 12/15/2014 6.6  6.5 - 8.1 g/dL Final  . Albumin 12/15/2014 3.5   3.5 - 5.0 g/dL Final  . AST 12/15/2014 27  15 - 41 U/L Final  . ALT 12/15/2014 18  14 - 54 U/L Final  . Alkaline Phosphatase 12/15/2014 125  38 - 126 U/L Final  . Total Bilirubin 12/15/2014 0.8  0.3 - 1.2 mg/dL Final  . GFR calc non Af Amer 12/15/2014 45* >60 mL/min Final  . GFR calc Af Amer 12/15/2014 53* >60 mL/min Final   Comment: (NOTE) The eGFR has been calculated using the CKD EPI equation. This calculation has not been validated in all clinical situations. eGFR's persistently <60 mL/min signify possible Chronic Kidney Disease.   . Anion gap 12/15/2014 13  5 - 15 Final  . Magnesium 12/15/2014 1.2* 1.7 - 2.4 mg/dL Final  . WBC 12/15/2014 7.4  3.6 - 11.0 K/uL Final  . RBC 12/15/2014 3.75* 3.80 - 5.20 MIL/uL Final  . Hemoglobin 12/15/2014 10.8* 12.0 - 16.0 g/dL Final  . HCT 12/15/2014 32.8* 35.0 - 47.0 % Final  . MCV 12/15/2014 87.4  80.0 - 100.0 fL Final  . MCH 12/15/2014 28.9  26.0 - 34.0 pg Final  . MCHC 12/15/2014 33.0  32.0 - 36.0 g/dL Final  . RDW 12/15/2014 20.2* 11.5 - 14.5 % Final  . Platelets 12/15/2014 119* 150 - 440 K/uL Final  . Neutrophils Relative % 12/15/2014 59   Final  . Neutro Abs 12/15/2014 4.5  1.4 - 6.5 K/uL Final  . Lymphocytes Relative 12/15/2014 27   Final  . Lymphs Abs 12/15/2014 2.0  1.0 - 3.6 K/uL Final  . Monocytes Relative 12/15/2014 13   Final  . Monocytes Absolute 12/15/2014 0.9  0.2 - 0.9 K/uL Final  . Eosinophils Relative 12/15/2014 0   Final  . Eosinophils Absolute 12/15/2014 0.0  0 - 0.7 K/uL Final  . Basophils Relative 12/15/2014 1   Final  . 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  05 the fluid and evaluation to rule out any sepsis patient was transferred to emergency room     Patient expressed understanding and was in agreement with this plan. She also understands that She can call clinic at any time with any questions, concerns, or complaints.    Primary cancer of endometrium   Staging form: Corpus Uteri - Carcinoma, AJCC 7th Edition     Clinical: Stage IVB (T1a, N0, M1) - Signed by Forest Gleason, MD on 05/24/2014     Pathologic: No stage assigned - Marni Griffon, MD   12/15/2014 3:58 PM

## 2014-12-15 NOTE — Telephone Encounter (Signed)
Reports diarrhea times 5 days, not  Eating or drinking much vomited a couple of times. Very weak, has lab appt today and is asking if she can get IVF today. She is scheduled for Xgeva this afternoon.

## 2014-12-15 NOTE — Telephone Encounter (Signed)
Per Dr Oliva Bustard, CBC, MET C, Mg+ this afternoon and IVF. Notified pt

## 2014-12-16 ENCOUNTER — Telehealth: Payer: Self-pay | Admitting: *Deleted

## 2014-12-16 ENCOUNTER — Ambulatory Visit: Payer: No Typology Code available for payment source

## 2014-12-16 ENCOUNTER — Other Ambulatory Visit: Payer: Self-pay | Admitting: Family Medicine

## 2014-12-16 ENCOUNTER — Inpatient Hospital Stay: Payer: No Typology Code available for payment source

## 2014-12-16 ENCOUNTER — Inpatient Hospital Stay: Payer: No Typology Code available for payment source | Admitting: Oncology

## 2014-12-16 ENCOUNTER — Encounter: Payer: Self-pay | Admitting: Oncology

## 2014-12-16 ENCOUNTER — Inpatient Hospital Stay
Admission: AD | Admit: 2014-12-16 | Discharge: 2014-12-19 | DRG: 393 | Disposition: A | Discharge status: Home / Self Care | Payer: No Typology Code available for payment source | Source: Ambulatory Visit | Attending: Oncology | Admitting: Oncology

## 2014-12-16 ENCOUNTER — Other Ambulatory Visit: Payer: Self-pay | Admitting: *Deleted

## 2014-12-16 VITALS — BP 119/99 | HR 121 | Temp 97.6°F | Resp 20

## 2014-12-16 DIAGNOSIS — D709 Neutropenia, unspecified: Secondary | ICD-10-CM | POA: Diagnosis not present

## 2014-12-16 DIAGNOSIS — G43909 Migraine, unspecified, not intractable, without status migrainosus: Secondary | ICD-10-CM | POA: Diagnosis present

## 2014-12-16 DIAGNOSIS — E860 Dehydration: Secondary | ICD-10-CM | POA: Diagnosis present

## 2014-12-16 DIAGNOSIS — Z86711 Personal history of pulmonary embolism: Secondary | ICD-10-CM

## 2014-12-16 DIAGNOSIS — R188 Other ascites: Secondary | ICD-10-CM | POA: Diagnosis present

## 2014-12-16 DIAGNOSIS — Z923 Personal history of irradiation: Secondary | ICD-10-CM

## 2014-12-16 DIAGNOSIS — C779 Secondary and unspecified malignant neoplasm of lymph node, unspecified: Secondary | ICD-10-CM | POA: Diagnosis not present

## 2014-12-16 DIAGNOSIS — K521 Toxic gastroenteritis and colitis: Secondary | ICD-10-CM | POA: Diagnosis present

## 2014-12-16 DIAGNOSIS — C541 Malignant neoplasm of endometrium: Secondary | ICD-10-CM

## 2014-12-16 DIAGNOSIS — D649 Anemia, unspecified: Secondary | ICD-10-CM | POA: Diagnosis present

## 2014-12-16 DIAGNOSIS — Z882 Allergy status to sulfonamides status: Secondary | ICD-10-CM

## 2014-12-16 DIAGNOSIS — K7460 Unspecified cirrhosis of liver: Secondary | ICD-10-CM | POA: Diagnosis present

## 2014-12-16 DIAGNOSIS — M1990 Unspecified osteoarthritis, unspecified site: Secondary | ICD-10-CM | POA: Diagnosis present

## 2014-12-16 DIAGNOSIS — E872 Acidosis: Secondary | ICD-10-CM | POA: Diagnosis present

## 2014-12-16 DIAGNOSIS — K591 Functional diarrhea: Secondary | ICD-10-CM

## 2014-12-16 DIAGNOSIS — Z79891 Long term (current) use of opiate analgesic: Secondary | ICD-10-CM

## 2014-12-16 DIAGNOSIS — M129 Arthropathy, unspecified: Secondary | ICD-10-CM

## 2014-12-16 DIAGNOSIS — R531 Weakness: Secondary | ICD-10-CM

## 2014-12-16 DIAGNOSIS — N289 Disorder of kidney and ureter, unspecified: Secondary | ICD-10-CM

## 2014-12-16 DIAGNOSIS — I959 Hypotension, unspecified: Secondary | ICD-10-CM | POA: Diagnosis present

## 2014-12-16 DIAGNOSIS — Z79899 Other long term (current) drug therapy: Secondary | ICD-10-CM | POA: Diagnosis not present

## 2014-12-16 DIAGNOSIS — Z170 Estrogen receptor positive status [ER+]: Secondary | ICD-10-CM

## 2014-12-16 DIAGNOSIS — Z9221 Personal history of antineoplastic chemotherapy: Secondary | ICD-10-CM | POA: Diagnosis not present

## 2014-12-16 DIAGNOSIS — Z5111 Encounter for antineoplastic chemotherapy: Secondary | ICD-10-CM | POA: Diagnosis not present

## 2014-12-16 DIAGNOSIS — E43 Unspecified severe protein-calorie malnutrition: Secondary | ICD-10-CM | POA: Diagnosis present

## 2014-12-16 DIAGNOSIS — K219 Gastro-esophageal reflux disease without esophagitis: Secondary | ICD-10-CM | POA: Diagnosis present

## 2014-12-16 DIAGNOSIS — Z803 Family history of malignant neoplasm of breast: Secondary | ICD-10-CM

## 2014-12-16 DIAGNOSIS — T451X5A Adverse effect of antineoplastic and immunosuppressive drugs, initial encounter: Secondary | ICD-10-CM | POA: Diagnosis present

## 2014-12-16 DIAGNOSIS — C7951 Secondary malignant neoplasm of bone: Secondary | ICD-10-CM | POA: Diagnosis not present

## 2014-12-16 DIAGNOSIS — Z6827 Body mass index (BMI) 27.0-27.9, adult: Secondary | ICD-10-CM | POA: Diagnosis not present

## 2014-12-16 DIAGNOSIS — M25561 Pain in right knee: Secondary | ICD-10-CM

## 2014-12-16 DIAGNOSIS — Z90710 Acquired absence of both cervix and uterus: Secondary | ICD-10-CM

## 2014-12-16 DIAGNOSIS — D72829 Elevated white blood cell count, unspecified: Secondary | ICD-10-CM | POA: Diagnosis present

## 2014-12-16 DIAGNOSIS — R197 Diarrhea, unspecified: Secondary | ICD-10-CM | POA: Diagnosis present

## 2014-12-16 DIAGNOSIS — C786 Secondary malignant neoplasm of retroperitoneum and peritoneum: Secondary | ICD-10-CM

## 2014-12-16 DIAGNOSIS — M25562 Pain in left knee: Secondary | ICD-10-CM

## 2014-12-16 DIAGNOSIS — C482 Malignant neoplasm of peritoneum, unspecified: Secondary | ICD-10-CM | POA: Diagnosis present

## 2014-12-16 DIAGNOSIS — R000 Tachycardia, unspecified: Secondary | ICD-10-CM

## 2014-12-16 DIAGNOSIS — I10 Essential (primary) hypertension: Secondary | ICD-10-CM | POA: Diagnosis present

## 2014-12-16 DIAGNOSIS — Z90722 Acquired absence of ovaries, bilateral: Secondary | ICD-10-CM

## 2014-12-16 DIAGNOSIS — Z7901 Long term (current) use of anticoagulants: Secondary | ICD-10-CM | POA: Diagnosis not present

## 2014-12-16 DIAGNOSIS — Z8669 Personal history of other diseases of the nervous system and sense organs: Secondary | ICD-10-CM

## 2014-12-16 DIAGNOSIS — R5383 Other fatigue: Secondary | ICD-10-CM

## 2014-12-16 DIAGNOSIS — R195 Other fecal abnormalities: Secondary | ICD-10-CM

## 2014-12-16 DIAGNOSIS — R0609 Other forms of dyspnea: Secondary | ICD-10-CM

## 2014-12-16 LAB — URINALYSIS COMPLETE WITH MICROSCOPIC (ARMC ONLY)
BILIRUBIN URINE: NEGATIVE
Bacteria, UA: NONE SEEN
Glucose, UA: NEGATIVE mg/dL
Leukocytes, UA: NEGATIVE
Nitrite: NEGATIVE
Protein, ur: NEGATIVE mg/dL
Specific Gravity, Urine: 1.013 (ref 1.005–1.030)
pH: 5 (ref 5.0–8.0)

## 2014-12-16 LAB — MAGNESIUM: MAGNESIUM: 1.1 mg/dL — AB (ref 1.7–2.4)

## 2014-12-16 LAB — CBC WITH DIFFERENTIAL/PLATELET
BASOS PCT: 1 %
Basophils Absolute: 0.1 10*3/uL (ref 0–0.1)
EOS ABS: 0 10*3/uL (ref 0–0.7)
EOS PCT: 0 %
HCT: 29.9 % — ABNORMAL LOW (ref 35.0–47.0)
HEMOGLOBIN: 10.1 g/dL — AB (ref 12.0–16.0)
LYMPHS ABS: 1.6 10*3/uL (ref 1.0–3.6)
Lymphocytes Relative: 16 %
MCH: 29.3 pg (ref 26.0–34.0)
MCHC: 33.7 g/dL (ref 32.0–36.0)
MCV: 86.9 fL (ref 80.0–100.0)
MONO ABS: 1 10*3/uL — AB (ref 0.2–0.9)
MONOS PCT: 11 %
Neutro Abs: 7.2 10*3/uL — ABNORMAL HIGH (ref 1.4–6.5)
Neutrophils Relative %: 72 %
PLATELETS: 120 10*3/uL — AB (ref 150–440)
RBC: 3.44 MIL/uL — ABNORMAL LOW (ref 3.80–5.20)
RDW: 20.1 % — AB (ref 11.5–14.5)
WBC: 10 10*3/uL (ref 3.6–11.0)

## 2014-12-16 LAB — COMPREHENSIVE METABOLIC PANEL
ALBUMIN: 3.2 g/dL — AB (ref 3.5–5.0)
ALK PHOS: 119 U/L (ref 38–126)
ALT: 15 U/L (ref 14–54)
AST: 22 U/L (ref 15–41)
Anion gap: 12 (ref 5–15)
BUN: 28 mg/dL — AB (ref 6–20)
CHLORIDE: 103 mmol/L (ref 101–111)
CO2: 17 mmol/L — ABNORMAL LOW (ref 22–32)
CREATININE: 1.14 mg/dL — AB (ref 0.44–1.00)
Calcium: 7.3 mg/dL — ABNORMAL LOW (ref 8.9–10.3)
GFR calc Af Amer: 59 mL/min — ABNORMAL LOW (ref >60)
GFR, EST NON AFRICAN AMERICAN: 51 mL/min — AB (ref >60)
Glucose, Bld: 103 mg/dL — ABNORMAL HIGH (ref 65–99)
Potassium: 3.8 mmol/L (ref 3.5–5.1)
SODIUM: 132 mmol/L — AB (ref 135–145)
Total Bilirubin: 0.5 mg/dL (ref 0.3–1.2)
Total Protein: 6.3 g/dL — ABNORMAL LOW (ref 6.5–8.1)

## 2014-12-16 LAB — SAMPLE TO BLOOD BANK

## 2014-12-16 MED ORDER — SODIUM CHLORIDE 0.9 % IV SOLN
Freq: Once | INTRAVENOUS | Status: AC
  Administered 2014-12-16: 13:00:00 via INTRAVENOUS
  Filled 2014-12-16: qty 1000

## 2014-12-16 MED ORDER — CIPROFLOXACIN IN D5W 400 MG/200ML IV SOLN
400.0000 mg | Freq: Two times a day (BID) | INTRAVENOUS | Status: DC
  Administered 2014-12-16 – 2014-12-17 (×2): 400 mg via INTRAVENOUS
  Filled 2014-12-16 (×3): qty 200

## 2014-12-16 MED ORDER — OXYCODONE HCL 5 MG PO TABS
5.0000 mg | ORAL_TABLET | Freq: Four times a day (QID) | ORAL | Status: DC | PRN
  Administered 2014-12-18: 23:00:00 5 mg via ORAL
  Filled 2014-12-16: qty 1

## 2014-12-16 MED ORDER — MAGNESIUM SULFATE 4 GM/100ML IV SOLN
4.0000 g | Freq: Once | INTRAVENOUS | Status: AC
  Administered 2014-12-16: 4 g via INTRAVENOUS
  Filled 2014-12-16: qty 100

## 2014-12-16 MED ORDER — FAMOTIDINE 20 MG PO TABS
20.0000 mg | ORAL_TABLET | Freq: Two times a day (BID) | ORAL | Status: DC
  Administered 2014-12-16 – 2014-12-19 (×6): 20 mg via ORAL
  Filled 2014-12-16 (×6): qty 1

## 2014-12-16 MED ORDER — SODIUM CHLORIDE 0.9 % IV SOLN
INTRAVENOUS | Status: DC
  Administered 2014-12-16 – 2014-12-19 (×8): via INTRAVENOUS

## 2014-12-16 MED ORDER — ENOXAPARIN SODIUM 40 MG/0.4ML ~~LOC~~ SOLN
40.0000 mg | SUBCUTANEOUS | Status: DC
  Administered 2014-12-16 – 2014-12-17 (×2): 40 mg via SUBCUTANEOUS
  Filled 2014-12-16 (×2): qty 0.4

## 2014-12-16 MED ORDER — METRONIDAZOLE IVPB CUSTOM
250.0000 mg | Freq: Three times a day (TID) | INTRAVENOUS | Status: DC
  Administered 2014-12-16 – 2014-12-17 (×3): 250 mg via INTRAVENOUS
  Filled 2014-12-16 (×5): qty 50

## 2014-12-16 MED ORDER — LOPERAMIDE HCL 2 MG PO CAPS
4.0000 mg | ORAL_CAPSULE | Freq: Once | ORAL | Status: AC
  Administered 2014-12-16: 4 mg via ORAL
  Filled 2014-12-16: qty 2

## 2014-12-16 MED ORDER — ATENOLOL 100 MG PO TABS
100.0000 mg | ORAL_TABLET | Freq: Every day | ORAL | Status: DC
  Administered 2014-12-17 – 2014-12-19 (×3): 100 mg via ORAL
  Filled 2014-12-16 (×3): qty 1

## 2014-12-16 MED ORDER — FENTANYL 50 MCG/HR TD PT72
100.0000 ug | MEDICATED_PATCH | TRANSDERMAL | Status: DC
  Administered 2014-12-16: 17:00:00 100 ug via TRANSDERMAL
  Filled 2014-12-16: qty 2

## 2014-12-16 MED ORDER — CITALOPRAM HYDROBROMIDE 20 MG PO TABS
40.0000 mg | ORAL_TABLET | Freq: Every day | ORAL | Status: DC
  Administered 2014-12-17 – 2014-12-19 (×3): 40 mg via ORAL
  Filled 2014-12-16 (×3): qty 2

## 2014-12-16 MED ORDER — SODIUM CHLORIDE 0.9 % IV SOLN
Freq: Once | INTRAVENOUS | Status: AC
  Administered 2014-12-16: 11:00:00 via INTRAVENOUS
  Filled 2014-12-16: qty 1000

## 2014-12-16 MED ORDER — WARFARIN SODIUM 5 MG PO TABS: 7.5000 mg | ORAL_TABLET | Freq: Every day | ORAL | Status: DC

## 2014-12-16 MED ORDER — LOPERAMIDE HCL 2 MG PO CAPS: 2.0000 mg | ORAL_CAPSULE | Freq: Four times a day (QID) | ORAL | Status: DC | PRN

## 2014-12-16 MED ORDER — ACETAMINOPHEN 325 MG PO TABS: 650.0000 mg | ORAL_TABLET | ORAL | Status: DC | PRN

## 2014-12-16 MED ORDER — MIRTAZAPINE 15 MG PO TABS
15.0000 mg | ORAL_TABLET | Freq: Every day | ORAL | Status: DC
  Administered 2014-12-16 – 2014-12-18 (×3): 15 mg via ORAL
  Filled 2014-12-16 (×3): qty 1

## 2014-12-16 MED ORDER — ONDANSETRON HCL 4 MG/2ML IJ SOLN
4.0000 mg | INTRAMUSCULAR | Status: DC | PRN
  Administered 2014-12-16 – 2014-12-17 (×2): 4 mg via INTRAVENOUS
  Filled 2014-12-16 (×2): qty 2

## 2014-12-16 NOTE — Progress Notes (Signed)
[place] @ [facility] [phone]  Fax:([phone] [place]: 1955-11-01  MR#: [id number]  INO#:[id number]  Patient Care Team: Ashok Norris, MD as PCP - General (Family Medicine)  CHIEF COMPLAINT:  Chief Complaint  Patient presents with  . 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 abdominal paracentesis x1 negative for malignant cells(22nd September)  2.PET scan shows extensive disease with lymph node metastases and bone metastases T1 N0 M1 disease biopsy is consistent with adenocarcinoma GYN or lesion.  Most likely from the endometrial cancer.  Estrogen receptor positive.  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 abdominal paracentesis x1 negative for malignant cells(22nd September)  2.PET scan shows extensive disease with lymph node metastases and bone metastases T1 N0 M1 disease biopsy is consistent with adenocarcinoma GYN or lesion.  Most likely from the endometrial cancer.  Estrogen receptor positive.  HER-2/neu receptor pending(October, 2015)  3.patient started on chemotherapy with carboplatinum and Taxolfrom November 06, 2013 4.acute pulmonary embolism February of 2016 on xeralto. 5.  Because of insurance not paying for [place] patient has been switched over to Coumadin (May, 2016) 6.  Now on maintenance  a Avastin (April, 2016)   6.  Progressing disease by CT scan and MRI scan (October, 2016) 7.  His and has finished radiation therapy to the spine    Oncology Flowsheet 11/25/2014 11/26/2014 11/27/2014 11/28/2014 11/29/2014 12/08/2014 12/15/2014  Day, Cycle - - - - - Day 1, Cycle 2 -  bevacizumab (AVASTIN) IV - - - - - - -  CISplatin (PLATINOL) IV - - - - - 50 mg/m2 -  denosumab (XGEVA) [place] - - - - - - -  dexamethasone (DECADRON) IV - - - - - [ 12 mg ] -  diphenhydrAMINE (BENADRYL) PO - - - - - - -  DOCEtaxel (TAXOTERE) IV - - - - - 60 mg/m2 -  fosaprepitant (EMEND) IV - - - - - [ 150 mg ] -  ondansetron (ZOFRAN) IV - - - - - - 8 mg  palonosetron (ALOXI) IV - - - - - 0.25 mg -  pegfilgrastim (NEULASTA ONPRO KIT) [place] - - - - - 6 mg -  Tbo-Filgrastim (GRANIX) [place] 480 mcg 480 mcg 480 mcg 480 mcg 480 mcg - -    INTERVAL HISTORY:  59 year old lady with stage IV endometrial cancer recently admitted in the hospital with neutropenia.  According to patient after radiation therapy patient's pain has not improved.  Constant dull aching pain on the left side in the femur and pelvis.  In has improved after patient was asked to increase fentanyl patch.   Patient is feeling weak and tired Patient was sent to emergency room  and I had a contact with emergency room physician last night.  IV fluid brought the blood pressure up and patient was discharged.  To be followed here today.  Patient continues to diarrhea to loose stool this morning.  Patient has stopped taking any stool softener and laxative did not take any Imodium.  Feeling extremely weak and tired. No chills or fever. REVIEW OF SYSTEMS:   Gen. Status: Patient was feeling extremely weak and tired.  No chills fever cold and clammy.  No fever Feeling still very weak but blood pressure is improved. GI: Diarrhea as described above.  No nausea or vomiting. Diarrhea continues patient has stopped taking any laxative but did not take any Imodium No rectal  bleeding.  No abdominal pain.  GU no dysuria hematuria. Neurological system no headache no dizziness. GU: No dysuria hematuria musculoskeletal system no bony pain all other  12  systems have been reviewed  PAST MEDICAL HISTORY: Past Medical History  Diagnosis Date  . Cancer ([place])   . Primary cancer of endometrium ([place]) 05/24/2014  . Hypertension   . Knee pain, bilateral   . Arthritis   . Renal insufficiency     Significant History/PMH:   Stage 4 peritoneal Cancer:    Cirrhosis:    GERD:    endometerial cancer:    Arthritis:    Hypertension:    Migraines:    Hysterectomy:    Cholecystectomy:    Dilation and Curretage: with hysteroscopy, Aug 2012 FAMILY HISTORY Family History  Problem Relation Age of Onset  . Breast cancer Sister 45  . Hypertension Sister   . Diabetes Mother   . Hypertension Mother       ADVANCED DIRECTIVES:does have advanced healthcare directive   HEALTH MAINTENANCE: Social History  Substance Use Topics  . Smoking status: Never Smoker   . Smokeless tobacco: None  . Alcohol Use: No    :  Allergies  Allergen Reactions  . Sulfa Antibiotics Anaphylaxis    Current Outpatient Prescriptions  Medication Sig Dispense Refill  . atenolol (TENORMIN) 100 MG tablet Take 1 tablet (100 mg total) by mouth daily. 30 tablet 5  . citalopram (CELEXA) 40 MG tablet Take 1 tablet (40 mg total) by mouth daily. 30 tablet 2  . cyclobenzaprine (FLEXERIL) 5 MG tablet Take 5 mg by mouth 3 (three) times daily as needed for muscle spasms.    Marland Kitchen dexamethasone (DECADRON) 4 MG tablet Take 8 mg by mouth 2 (two) times daily. Pt is to take the day before chemo(Taxotere).    . famotidine (PEPCID) 20 MG tablet Take 1 tablet (20 mg total) by mouth 2 (two) times daily. 60 tablet 5  . fentaNYL (DURAGESIC - DOSED MCG/HR) 25 MCG/HR patch Place 25 mcg onto the skin every 3 (three) days. Pt uses with a 8mg patch.    . fentaNYL (DURAGESIC - DOSED MCG/HR) 75 MCG/HR Place 75 mcg  onto the skin every 3 (three) days. Pt uses with a 26m patch.    . gabapentin (NEURONTIN) 300 MG capsule Take 1 capsule (300 mg total) by mouth 2 (two) times daily. 60 capsule 3  . levofloxacin (LEVAQUIN) 500 MG tablet Take 1 tablet (500 mg total) by mouth daily. 5 tablet 0  . lidocaine-prilocaine (EMLA) cream Apply 1 application topically as needed. (Patient taking differently: Apply 1 application topically as needed (prior to accessing port). ) 30 g 3  . loratadine (CLARITIN) 10 MG tablet Take 1 tablet (10 mg total) by mouth daily. 30 tablet  5  . mirtazapine (REMERON) 15 MG tablet Take 15 mg by mouth at bedtime.    . Multiple Vitamin (MULTIVITAMIN WITH MINERALS) TABS tablet Take 1 tablet by mouth daily.    . ondansetron (ZOFRAN) 4 MG tablet Take 1 tablet (4 mg total) by mouth every 6 (six) hours as needed. (Patient taking differently: Take 4 mg by mouth every 6 (six) hours as needed for nausea or vomiting. ) 30 tablet 3  . oxyCODONE (ROXICODONE) 15 MG immediate release tablet Take 2 tablets (30 mg total) by mouth every 2 (two) hours as needed for pain. 90 tablet 0  . psyllium (METAMUCIL) 58.6 % powder Take 1 packet by mouth 3 (three) times daily as needed (for constipation).     Marland Kitchen senna-docusate (SENOKOT-S) 8.6-50 MG tablet Take 1 tablet by mouth 2 (two) times daily as needed for mild constipation.    . sucralfate (CARAFATE) 1 G tablet Take 1 tablet (1 g total) by mouth 4 (four) times daily -  with meals and at bedtime. 90 tablet 3  . warfarin (COUMADIN) 5 MG tablet Take 1.5 tablets (7.5 mg total) by mouth daily at 6 PM. 30 tablet 1   No current facility-administered medications for this visit.   Facility-Administered Medications Ordered in Other Visits  Medication Dose Route Frequency Provider Last Rate Last Dose  . 0.9 %  sodium chloride infusion   Intravenous Once Forest Gleason, MD      . heparin lock flush 100 unit/mL  500 Units Intravenous Once Forest Gleason, MD      . heparin lock flush  100 unit/mL  500 Units Intravenous Once Forest Gleason, MD      . sodium chloride 0.9 % injection 10 mL  10 mL Intracatheter PRN Forest Gleason, MD   10 mL at 11/10/14 0837  . sodium chloride 0.9 % injection 10 mL  10 mL Intravenous PRN Forest Gleason, MD   10 mL at 12/08/14 0834  . sodium chloride 0.9 % injection 10 mL  10 mL Intravenous PRN Forest Gleason, MD   10 mL at 12/15/14 1417    OBJECTIVE:  Filed Vitals:   12/16/14 1035  BP: 119/99  Pulse: 121  Temp: 97.6 F (36.4 C)  Resp: 20     There is no weight on file to calculate BMI.    ECOG FS:1 - Symptomatic but completely ambulatory  PHYSICAL EXAM:  general status: Patient is feeling weak and tired.  No change in a performance status.  No chills.  No fever. HEENT: Alopecia.  No evidence of stomatitis Lungs: No cough or shortness of breath Cardiac: No chest pain or paroxysmal nocturnal dyspnea GI: No nausea no vomiting no diarrhea no abdominal pain Skin: No rash For skin turgor clinical signs of dehydration Lower extremity no swelling Neurological system: No tingling.  No numbness.  No other focal signs Musculoskeletal system : Increasing pain not able to bear weight on the l left lower extremity. Difficulty in ambulation Recent hospitalization with neutropenia and fever  NEUROLOGICAL: As per history of present illness weakness in the left lower extremity PSYCH:  Less depressed LAB RESULTS:  Appointment on 12/16/2014  Component Date Value Ref Range Status  . WBC 12/16/2014 10.0  3.6 - 11.0 K/uL Final  . RBC 12/16/2014 3.44* 3.80 - 5.20 MIL/uL Final  . Hemoglobin 12/16/2014 10.1* 12.0 - 16.0 g/dL Final  . HCT 12/16/2014 29.9* 35.0 - 47.0 % Final  . MCV 12/16/2014 86.9  80.0 - 100.0 fL Final  .  MCH 12/16/2014 29.3  26.0 - 34.0 pg Final  . MCHC 12/16/2014 33.7  32.0 - 36.0 g/dL Final  . RDW 12/16/2014 20.1* 11.5 - 14.5 % Final  . Platelets 12/16/2014 120* 150 - 440 K/uL Final  . Neutrophils Relative % 12/16/2014 72   Final  .  Neutro Abs 12/16/2014 7.2* 1.4 - 6.5 K/uL Final  . Lymphocytes Relative 12/16/2014 16   Final  . Lymphs Abs 12/16/2014 1.6  1.0 - 3.6 K/uL Final  . Monocytes Relative 12/16/2014 11   Final  . Monocytes Absolute 12/16/2014 1.0* 0.2 - 0.9 K/uL Final  . Eosinophils Relative 12/16/2014 0   Final  . Eosinophils Absolute 12/16/2014 0.0  0 - 0.7 K/uL Final  . Basophils Relative 12/16/2014 1   Final  . Basophils Absolute 12/16/2014 0.1  0 - 0.1 K/uL Final  Infusion on 12/15/2014  Component Date Value Ref Range Status  . Sodium 12/15/2014 130* 135 - 145 mmol/L Final  . Potassium 12/15/2014 4.3  3.5 - 5.1 mmol/L Final  . Chloride 12/15/2014 100* 101 - 111 mmol/L Final  . CO2 12/15/2014 17* 22 - 32 mmol/L Final  . Glucose, Bld 12/15/2014 113* 65 - 99 mg/dL Final  . BUN 12/15/2014 28* 6 - 20 mg/dL Final  . Creatinine, Ser 12/15/2014 1.25* 0.44 - 1.00 mg/dL Final  . Calcium 12/15/2014 7.6* 8.9 - 10.3 mg/dL Final  . Total Protein 12/15/2014 6.6  6.5 - 8.1 g/dL Final  . Albumin 12/15/2014 3.5  3.5 - 5.0 g/dL Final  . AST 12/15/2014 27  15 - 41 U/L Final  . ALT 12/15/2014 18  14 - 54 U/L Final  . Alkaline Phosphatase 12/15/2014 125  38 - 126 U/L Final  . Total Bilirubin 12/15/2014 0.8  0.3 - 1.2 mg/dL Final  . GFR calc non Af Amer 12/15/2014 45* >60 mL/min Final  . GFR calc Af Amer 12/15/2014 53* >60 mL/min Final   Comment: (NOTE) The eGFR has been calculated using the CKD EPI equation. This calculation has not been validated in all clinical situations. eGFR's persistently <60 mL/min signify possible Chronic Kidney Disease.   . Anion gap 12/15/2014 13  5 - 15 Final  . Magnesium 12/15/2014 1.2* 1.7 - 2.4 mg/dL Final  . WBC 12/15/2014 7.4  3.6 - 11.0 K/uL Final  . RBC 12/15/2014 3.75* 3.80 - 5.20 MIL/uL Final  . Hemoglobin 12/15/2014 10.8* 12.0 - 16.0 g/dL Final  . HCT 12/15/2014 32.8* 35.0 - 47.0 % Final  . MCV 12/15/2014 87.4  80.0 - 100.0 fL Final  . MCH 12/15/2014 28.9  26.0 - 34.0 pg Final   . MCHC 12/15/2014 33.0  32.0 - 36.0 g/dL Final  . RDW 12/15/2014 20.2* 11.5 - 14.5 % Final  . Platelets 12/15/2014 119* 150 - 440 K/uL Final  . Neutrophils Relative % 12/15/2014 59   Final  . Neutro Abs 12/15/2014 4.5  1.4 - 6.5 K/uL Final  . Lymphocytes Relative 12/15/2014 27   Final  . Lymphs Abs 12/15/2014 2.0  1.0 - 3.6 K/uL Final  . Monocytes Relative 12/15/2014 13   Final  . Monocytes Absolute 12/15/2014 0.9  0.2 - 0.9 K/uL Final  . Eosinophils Relative 12/15/2014 0   Final  . Eosinophils Absolute 12/15/2014 0.0  0 - 0.7 K/uL Final  . Basophils Relative 12/15/2014 1   Final  . 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 She also understands that She can call clinic at any time with any questions, concerns, or complaints.    Primary cancer of endometrium   Staging form: Corpus Uteri - Carcinoma, AJCC 7th Edition     Clinical: Stage IVB (T1a, N0, M1) - Signed by Forest Gleason, MD on 05/24/2014     Pathologic: No stage assigned - Marni Griffon, MD   12/16/2014 11:03 AM

## 2014-12-16 NOTE — H&P (Signed)
3:58 PM       Related encounter: Office Visit from 12/15/2014 in [place] @ [facility] [phone]  [phone]     Emily Zamora OB: 1955-11-10  MR#: [id number]  MIW#:[id number]  Patient Care Team: Ashok Norris, MD as PCP - General (Family Medicine)  CHIEF COMPLAINT:  No chief complaint on file.   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 abdominal paracentesis x1 negative for malignant cells(22nd September)  2.PET scan shows extensive disease with lymph node metastases and bone metastases T1 N0 M1 disease biopsy is consistent with adenocarcinoma GYN or lesion.  Most likely from the endometrial cancer.  Estrogen receptor positive.  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 abdominal paracentesis x1 negative for malignant cells(22nd September)  2.PET scan shows extensive disease with lymph node metastases and bone metastases T1 N0 M1 disease biopsy is consistent with adenocarcinoma GYN or lesion.  Most likely from the endometrial cancer.  Estrogen receptor positive.  HER-2/neu receptor pending(October, 2015)  3.patient started on chemotherapy with carboplatinum and Taxolfrom November 06, 2013 4.acute pulmonary embolism February of 2016 on xeralto. 5.  Because of insurance not  paying for [place] patient has been switched over to Coumadin (May, 2016) 6.  Now on maintenance a Avastin (April, 2016)   6.  Progressing disease by CT scan and MRI scan (October, 2016) 7.  His and has finished radiation therapy to the spine    Oncology Flowsheet 11/25/2014 11/26/2014 11/27/2014 11/28/2014 11/29/2014 12/08/2014 12/15/2014  Day, Cycle - - - - - Day 1, Cycle 2 -  bevacizumab (AVASTIN) IV - - - - - - -  CISplatin (PLATINOL) IV - - - - - 50 mg/m2 -  denosumab (XGEVA) [place] - - - - - - -  dexamethasone (DECADRON) IV - - - - - [ 12 mg ] -  diphenhydrAMINE (BENADRYL) PO - - - - - - -  DOCEtaxel (TAXOTERE) IV - - - - - 60 mg/m2 -  fosaprepitant (EMEND) IV - - - - - [ 150 mg ] -  ondansetron (ZOFRAN) IV - - - - - - 8 mg  palonosetron (ALOXI) IV - - - - - 0.25 mg -  pegfilgrastim (NEULASTA ONPRO KIT) [place] - - - - - 6 mg -  Tbo-Filgrastim (GRANIX) Spray 480 mcg 480 mcg 480 mcg 480 mcg 480 mcg - -    INTERVAL HISTORY:  59 year old lady with stage IV endometrial cancer presented yesterday to the clinic with hypotension cold and clammy.  Patient was transferred to emergency room and patient received IV fluid blood pressure improved patient started feeling better and refused admission and was discharged.  This morning  back in lower clinic feeling week tired.  Patient lives by herself.  Diarrhea continued had 2 loose stools this morning.  Several stool last night. Patient was found to be dehydrated.  Tachycardic. Extremely weak.  Now being admitted in the hospital for IV fluid for further evaluation to rule out any systemic infection. Review of failure of outpatient therapy to control diarrhea i,nability to eat anything by mouth Ace and was given 1.5 L of fluid is outpatient in the clinic today without significant improvement   REVIEW OF SYSTEMS:    general status: Patient is feeling weak and tired.  DECLINING PERFORMANCE STATUS.  lOW-GRADE FEVER. HEENT: Alopecia.  No evidence of  stomatitis Lungs ;  Increasing shortness of breath on exertion. Cardiac: No chest pain or paroxysmal nocturnal dyspnea GI: all people loose stool.  Abdominal pain. Skin: No rash Lower extremity no swelling Neurological system: No tingling.  No numbness.  No other focal signs Musculoskeletal system no bony pains All other systems have been reviewed independently  PAST MEDICAL HISTORY: Past Medical History  Diagnosis Date  . Cancer ([place])   . Primary cancer of endometrium ([place]) 05/24/2014  . Hypertension   . Knee pain, bilateral   . Arthritis   . Renal insufficiency     Significant History/PMH:   Stage 4 peritoneal Cancer:    Cirrhosis:    GERD:    endometerial cancer:    Arthritis:    Hypertension:    Migraines:    Hysterectomy:    Cholecystectomy:    Dilation and Curretage: with hysteroscopy, Aug 2012 FAMILY HISTORY Family History  Problem Relation Age of Onset  . Breast cancer Sister 67  . Hypertension Sister   . Diabetes Mother   . Hypertension Mother       ADVANCED DIRECTIVES:does have advanced healthcare directive   HEALTH MAINTENANCE: Social History  Substance Use Topics  . Smoking status: Never Smoker   . Smokeless tobacco: Not on file  . Alcohol Use: No    :  Allergies  Allergen Reactions  . Sulfa Antibiotics Anaphylaxis    No current facility-administered medications for this encounter.   Facility-Administered Medications Ordered in Other Encounters  Medication Dose Route Frequency Provider Last Rate Last Dose  . 0.9 %  sodium chloride infusion   Intravenous Once Forest Gleason, MD      . heparin lock flush 100 unit/mL  500 Units Intravenous Once Forest Gleason, MD      . heparin lock flush 100 unit/mL  500 Units Intravenous Once Forest Gleason, MD      . sodium chloride 0.9 % injection 10 mL  10 mL Intracatheter PRN Forest Gleason, MD   10 mL at 11/10/14 0837  . sodium chloride 0.9 % injection 10 mL  10 mL Intravenous PRN Forest Gleason, MD    10 mL at 12/08/14 0834  . sodium chloride 0.9 % injection 10 mL  10 mL Intravenous PRN Forest Gleason, MD   10 mL at 12/15/14 1417    OBJECTIVE:  Filed Vitals:   12/16/14 1605  BP: 126/71  Pulse: 108  Temp: 97.9 F (36.6 C)  Resp: 19     There is no weight on file to calculate BMI.    ECOG FS:1 - Symptomatic but completely ambulatory  PHYSICAL EXAM:  general status: Patient is feeling weak and tired.  No change in a performance status.  No chills.  No fever. HEENT: Alopecia.  No evidence of stomatitis Lungs: No cough or shortness of  breath Cardiac: No chest pain or paroxysmal nocturnal dyspnea GI: No nausea no vomiting no diarrhea no abdominal pain Skin: No rash Lower extremity no swelling Neurological system: No tingling.  No numbness.  No other focal signs Musculoskeletal system : Increasing pain not able to bear weight on the l left lower extremity. Difficulty in ambulation Recent hospitalization with neutropenia and fever  NEUROLOGICAL: As per history of present illness weakness in the left lower extremity PSYCH:  Less depressed LAB RESULTS:  Appointment on 12/16/2014  Component Date Value Ref Range Status  . WBC 12/16/2014 10.0  3.6 - 11.0 K/uL Final  . RBC 12/16/2014 3.44* 3.80 - 5.20 MIL/uL Final  . Hemoglobin 12/16/2014 10.1* 12.0 - 16.0 g/dL Final  . HCT 12/16/2014 29.9* 35.0 - 47.0 % Final  . MCV 12/16/2014 86.9  80.0 - 100.0 fL Final  . MCH 12/16/2014 29.3  26.0 - 34.0 pg Final  . MCHC 12/16/2014 33.7  32.0 - 36.0 g/dL Final  . RDW 12/16/2014 20.1* 11.5 - 14.5 % Final  . Platelets 12/16/2014 120* 150 - 440 K/uL Final  . Neutrophils Relative % 12/16/2014 72   Final  . Neutro Abs 12/16/2014 7.2* 1.4 - 6.5 K/uL Final  . Lymphocytes Relative 12/16/2014 16   Final  . Lymphs Abs 12/16/2014 1.6  1.0 - 3.6 K/uL Final  . Monocytes Relative 12/16/2014 11   Final  . Monocytes Absolute 12/16/2014 1.0* 0.2 - 0.9 K/uL Final  . Eosinophils Relative 12/16/2014 0   Final  .  Eosinophils Absolute 12/16/2014 0.0  0 - 0.7 K/uL Final  . Basophils Relative 12/16/2014 1   Final  . Basophils Absolute 12/16/2014 0.1  0 - 0.1 K/uL Final  . Sodium 12/16/2014 132* 135 - 145 mmol/L Final  . Potassium 12/16/2014 3.8  3.5 - 5.1 mmol/L Final  . Chloride 12/16/2014 103  101 - 111 mmol/L Final  . CO2 12/16/2014 17* 22 - 32 mmol/L Final  . Glucose, Bld 12/16/2014 103* 65 - 99 mg/dL Final  . BUN 12/16/2014 28* 6 - 20 mg/dL Final  . Creatinine, Ser 12/16/2014 1.14* 0.44 - 1.00 mg/dL Final  . Calcium 12/16/2014 7.3* 8.9 - 10.3 mg/dL Final  . Total Protein 12/16/2014 6.3* 6.5 - 8.1 g/dL Final  . Albumin 12/16/2014 3.2* 3.5 - 5.0 g/dL Final  . AST 12/16/2014 22  15 - 41 U/L Final  . ALT 12/16/2014 15  14 - 54 U/L Final  . Alkaline Phosphatase 12/16/2014 119  38 - 126 U/L Final  . Total Bilirubin 12/16/2014 0.5  0.3 - 1.2 mg/dL Final  . GFR calc non Af Amer 12/16/2014 51* >60 mL/min Final  . GFR calc Af Amer 12/16/2014 59* >60 mL/min Final   Comment: (NOTE) The eGFR has been calculated using the CKD EPI equation. This calculation has not been validated in all clinical situations. eGFR's persistently <60 mL/min signify possible Chronic Kidney Disease.   . Anion gap 12/16/2014 12  5 - 15 Final  . Magnesium 12/16/2014 1.1* 1.7 - 2.4 mg/dL Final  . Blood Bank Specimen 12/16/2014 SAMPLE AVAILABLE FOR TESTING   Final  . Sample Expiration 12/16/2014 12/19/2014   Final  Infusion on 12/15/2014  Component Date Value Ref Range Status  . Sodium 12/15/2014 130* 135 - 145 mmol/L Final  . Potassium 12/15/2014 4.3  3.5 - 5.1 mmol/L Final  . Chloride 12/15/2014 100* 101 - 111 mmol/L Final  . CO2 12/15/2014 17* 22 - 32 mmol/L Final  . Glucose, Bld  12/15/2014 113* 65 - 99 mg/dL Final  . BUN 12/15/2014 28* 6 - 20 mg/dL Final  . Creatinine, Ser 12/15/2014 1.25* 0.44 - 1.00 mg/dL Final  . Calcium 12/15/2014 7.6* 8.9 - 10.3 mg/dL Final  . Total Protein 12/15/2014 6.6  6.5 - 8.1 g/dL Final  .  Albumin 12/15/2014 3.5  3.5 - 5.0 g/dL Final  . AST 12/15/2014 27  15 - 41 U/L Final  . ALT 12/15/2014 18  14 - 54 U/L Final  . Alkaline Phosphatase 12/15/2014 125  38 - 126 U/L Final  . Total Bilirubin 12/15/2014 0.8  0.3 - 1.2 mg/dL Final  . GFR calc non Af Amer 12/15/2014 45* >60 mL/min Final  . GFR calc Af Amer 12/15/2014 53* >60 mL/min Final   Comment: (NOTE) The eGFR has been calculated using the CKD EPI equation. This calculation has not been validated in all clinical situations. eGFR's persistently <60 mL/min signify possible Chronic Kidney Disease.   . Anion gap 12/15/2014 13  5 - 15 Final  . Magnesium 12/15/2014 1.2* 1.7 - 2.4 mg/dL Final  . WBC 12/15/2014 7.4  3.6 - 11.0 K/uL Final  . RBC 12/15/2014 3.75* 3.80 - 5.20 MIL/uL Final  . Hemoglobin 12/15/2014 10.8* 12.0 - 16.0 g/dL Final  . HCT 12/15/2014 32.8* 35.0 - 47.0 % Final  . MCV 12/15/2014 87.4  80.0 - 100.0 fL Final  . MCH 12/15/2014 28.9  26.0 - 34.0 pg Final  . MCHC 12/15/2014 33.0  32.0 - 36.0 g/dL Final  . RDW 12/15/2014 20.2* 11.5 - 14.5 % Final  . Platelets 12/15/2014 119* 150 - 440 K/uL Final  . Neutrophils Relative % 12/15/2014 59   Final  . Neutro Abs 12/15/2014 4.5  1.4 - 6.5 K/uL Final  . Lymphocytes Relative 12/15/2014 27   Final  . Lymphs Abs 12/15/2014 2.0  1.0 - 3.6 K/uL Final  . Monocytes Relative 12/15/2014 13   Final  . Monocytes Absolute 12/15/2014 0.9  0.2 - 0.9 K/uL Final  . Eosinophils Relative 12/15/2014 0   Final  . Eosinophils Absolute 12/15/2014 0.0  0 - 0.7 K/uL Final  . Basophils Relative 12/15/2014 1   Final  . 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 IV fluid will be continued.  All lab data has been reviewed.  Patient is mildly acidotic stool for C. Difficile will be ordered.  Cipro and Flagyl would be started. DVT prophylaxis     Patient expressed understanding and was in agreement with this plan. She also understands that She can call clinic at any time with any questions, concerns, or complaints.    Primary cancer of endometrium   Staging form: Corpus Uteri - Carcinoma, AJCC 7th Edition     Clinical: Stage IVB (T1a, N0, M1) - Signed by Forest Gleason, MD on 05/24/2014     Pathologic: No stage assigned - Marni Griffon, MD   12/16/2014 4:14 PM

## 2014-12-16 NOTE — Progress Notes (Signed)
Patient acute add on for diarrhea and weakness.

## 2014-12-16 NOTE — Telephone Encounter (Signed)
Called report to [place] on 1-C.  Patient being admitted for dehydration.  MD will work her up for stool/blood cultures to r/o sepsis.  Patient being admitted to Room #118.  Orderly called for transport.

## 2014-12-16 NOTE — Plan of Care (Signed)
Problem: Education: Goal: Knowledge of [facility] Education information/materials will improve Outcome: Progressing Pt given information guide. Pt educated on using the call bell and phone. Pt verbalized understanding  Problem: Safety: Goal: Ability to remain free from injury will improve Outcome: Progressing Pt is high falls, bed alarm in use, call bell and phone within reach, hourly safety rounds  Problem: Pain Managment: Goal: General experience of comfort will improve Outcome: Progressing No c/o pain, pt resting comfortably in bed

## 2014-12-17 ENCOUNTER — Ambulatory Visit: Payer: No Typology Code available for payment source

## 2014-12-17 DIAGNOSIS — R110 Nausea: Secondary | ICD-10-CM

## 2014-12-17 DIAGNOSIS — R509 Fever, unspecified: Secondary | ICD-10-CM

## 2014-12-17 DIAGNOSIS — M79652 Pain in left thigh: Secondary | ICD-10-CM

## 2014-12-17 DIAGNOSIS — D72829 Elevated white blood cell count, unspecified: Secondary | ICD-10-CM

## 2014-12-17 DIAGNOSIS — E43 Unspecified severe protein-calorie malnutrition: Secondary | ICD-10-CM | POA: Insufficient documentation

## 2014-12-17 LAB — CBC WITH DIFFERENTIAL/PLATELET
BAND NEUTROPHILS: 15 %
BASOS PCT: 0 %
BASOS PCT: 0 %
Basophils Absolute: 0 10*3/uL (ref 0–0.1)
Basophils Absolute: 0 10*3/uL (ref 0–0.1)
Blasts: 0 %
EOS ABS: 0 10*3/uL (ref 0–0.7)
EOS PCT: 0 %
Eosinophils Absolute: 0 10*3/uL (ref 0–0.7)
Eosinophils Relative: 0 %
HCT: 24.5 % — ABNORMAL LOW (ref 35.0–47.0)
HCT: 23.7 % — ABNORMAL LOW (ref 35.0–47.0)
HEMOGLOBIN: 7.7 g/dL — AB (ref 12.0–16.0)
Hemoglobin: 8.1 g/dL — ABNORMAL LOW (ref 12.0–16.0)
LYMPHS ABS: 1.1 10*3/uL (ref 1.0–3.6)
LYMPHS PCT: 9 %
Lymphocytes Relative: 12 %
Lymphs Abs: 1 10*3/uL (ref 1.0–3.6)
MCH: 29.5 pg (ref 26.0–34.0)
MCH: 29.1 pg (ref 26.0–34.0)
MCHC: 32.9 g/dL (ref 32.0–36.0)
MCHC: 32.7 g/dL (ref 32.0–36.0)
MCV: 89.7 fL (ref 80.0–100.0)
MCV: 89.1 fL (ref 80.0–100.0)
MONO ABS: 0.6 10*3/uL (ref 0.2–0.9)
MONOS PCT: 7 %
MYELOCYTES: 0 %
Metamyelocytes Relative: 3 %
Monocytes Absolute: 0.8 10*3/uL (ref 0.2–0.9)
Monocytes Relative: 7 %
NEUTROS ABS: 9.2 10*3/uL — AB (ref 1.4–6.5)
NEUTROS PCT: 63 %
NEUTROS PCT: 84 %
NRBC: 0 /100{WBCs}
Neutro Abs: 7.2 10*3/uL — ABNORMAL HIGH (ref 1.4–6.5)
Other: 0 %
PLATELETS: 86 10*3/uL — AB (ref 150–440)
PLATELETS: 88 10*3/uL — AB (ref 150–440)
PROMYELOCYTES ABS: 0 %
RBC: 2.73 MIL/uL — ABNORMAL LOW (ref 3.80–5.20)
RBC: 2.66 MIL/uL — ABNORMAL LOW (ref 3.80–5.20)
RDW: 20.7 % — AB (ref 11.5–14.5)
RDW: 20.8 % — ABNORMAL HIGH (ref 11.5–14.5)
WBC: 8.9 10*3/uL (ref 3.6–11.0)
WBC: 11 10*3/uL (ref 3.6–11.0)

## 2014-12-17 LAB — COMPREHENSIVE METABOLIC PANEL
ALT: 13 U/L — ABNORMAL LOW (ref 14–54)
ANION GAP: 7 (ref 5–15)
AST: 18 U/L (ref 15–41)
Albumin: 2.5 g/dL — ABNORMAL LOW (ref 3.5–5.0)
Alkaline Phosphatase: 91 U/L (ref 38–126)
BUN: 13 mg/dL (ref 6–20)
CHLORIDE: 110 mmol/L (ref 101–111)
CO2: 20 mmol/L — AB (ref 22–32)
Calcium: 6.3 mg/dL — CL (ref 8.9–10.3)
Creatinine, Ser: 0.86 mg/dL (ref 0.44–1.00)
GFR calc Af Amer: >60 mL/min (ref >60)
GFR calc non Af Amer: >60 mL/min (ref >60)
GLUCOSE: 79 mg/dL (ref 65–99)
POTASSIUM: 3.2 mmol/L — AB (ref 3.5–5.1)
SODIUM: 137 mmol/L (ref 135–145)
Total Bilirubin: 0.5 mg/dL (ref 0.3–1.2)
Total Protein: 5 g/dL — ABNORMAL LOW (ref 6.5–8.1)

## 2014-12-17 LAB — APTT: aPTT: 44 seconds — ABNORMAL HIGH (ref 24–36)

## 2014-12-17 LAB — C DIFFICILE QUICK SCREEN W PCR REFLEX
C DIFFICILE (CDIFF) INTERP: NEGATIVE
C DIFFICILE (CDIFF) TOXIN: NEGATIVE
C DIFFICLE (CDIFF) ANTIGEN: NEGATIVE

## 2014-12-17 MED ORDER — ENSURE ENLIVE PO LIQD
237.0000 mL | Freq: Three times a day (TID) | ORAL | Status: DC
  Administered 2014-12-17 – 2014-12-18 (×3): 237 mL via ORAL

## 2014-12-17 MED ORDER — CALCIUM CARBONATE-VITAMIN D 500-200 MG-UNIT PO TABS
1.0000 | ORAL_TABLET | Freq: Two times a day (BID) | ORAL | Status: DC
  Administered 2014-12-17 – 2014-12-19 (×5): 1 via ORAL
  Filled 2014-12-17 (×5): qty 1

## 2014-12-17 MED ORDER — CIPROFLOXACIN 500 MG/5ML (10%) PO SUSR
500.0000 mg | Freq: Two times a day (BID) | ORAL | Status: DC
  Administered 2014-12-17 – 2014-12-18 (×2): 500 mg via ORAL
  Filled 2014-12-17 (×4): qty 5

## 2014-12-17 NOTE — Plan of Care (Signed)
Problem: Bowel/Gastric: Goal: Will not experience complications related to bowel motility Outcome: Progressing Patient is alert and oriented. VSS. IV fluids infusing. No complaints of pain. Patient does not have much appetite, Ensure started. Stool sent to lab to rule out CDiff. 1 assist to [facility]. Family at bedside.

## 2014-12-17 NOTE — Progress Notes (Signed)
Initial Nutrition Assessment  DOCUMENTATION CODES:   Severe malnutrition in context of acute illness/injury in the setting of cancer and cancer related treatment  INTERVENTION:   Meals and Snacks: Cater to patient preferences; await diet progression as pt reports not ready to try solids at this times Medical Food Supplement Therapy: will recommend Ensure Enlive po BID, each supplement provides 350 kcal and 20 grams of protein and Magic Cup BID for added nutrition   NUTRITION DIAGNOSIS:   Malnutrition related to acute illness, cancer and cancer related treatments as evidenced by percent weight loss, energy intake < or equal to 50% for > or equal to 5 days.  GOAL:   Patient will meet greater than or equal to 90% of their needs  MONITOR:    (Energy Intake, Anthropometrics, Electrolyte and Rneal Profie, Digestive System)  REASON FOR ASSESSMENT:   Malnutrition Screening Tool    ASSESSMENT:   Pt admitted with diarrhea and dehydration. Pt with h/o endometrial cancer with mets to lymph nodes and bone. Pt started chemotherapy 11/4 per MD note.   Past Medical History  Diagnosis Date  . Cancer ([place])   . Primary cancer of endometrium ([place]) 05/24/2014  . Hypertension   . Knee pain, bilateral   . Arthritis   . Renal insufficiency     Diet Order:  Diet full liquid Room service appropriate?: Yes; Fluid consistency:: Thin    Current Nutrition: Pt reports eating 5 teaspoons of cream of wheat this am for breakfast.  Food/Nutrition-Related History: Pt reports eating cream of wheat this am was the 'most' she has been able to eat in 9 days PTA; which was only bites. Pt reports very poor po intake secondary to nausea and diarrhea. Pt reports having Boost supplements at home but not drinking much over the past week. Pt reports not tolerating carbonated drinks but tolerates water and milk well.    Scheduled Medications:  . atenolol  100 mg Oral Daily  . calcium-vitamin D  1 tablet Oral BID   . ciprofloxacin  400 mg Intravenous Q12H  . citalopram  40 mg Oral Daily  . enoxaparin (LOVENOX) injection  40 mg Subcutaneous Q24H  . famotidine  20 mg Oral BID  . feeding supplement (ENSURE ENLIVE)  237 mL Oral TID WC  . fentaNYL  100 mcg Transdermal Q72H  . metronidazole  250 mg Intravenous Q8H  . mirtazapine  15 mg Oral QHS    Continuous Medications:  . sodium chloride 125 mL/hr at 12/17/14 0939     Electrolyte/Renal Profile and Glucose Profile:   Recent Labs Lab 12/15/14 1351 12/16/14 1036 12/17/14 0517  NA 130* 132* 137  K 4.3 3.8 3.2*  CL 100* 103 110  CO2 17* 17* 20*  BUN 28* 28* 13  CREATININE 1.25* 1.14* 0.86  CALCIUM 7.6* 7.3* 6.3*  MG 1.2* 1.1*  --   GLUCOSE 113* 103* 79   Protein Profile:   Recent Labs Lab 12/15/14 1351 12/16/14 1036 12/17/14 0517  ALBUMIN 3.5 3.2* 2.5*    Gastrointestinal Profile: Last BM: 12/16/2014   Nutrition-Focused Physical Exam Findings: Nutrition-Focused physical exam completed. Findings are WDL for fat depletion, muscle depletion, and edema.    Weight Change: Pt reports weight loss of 10lbs in the past month. Per [facility] weight of 175lbs 2 months ago trending down; 10% weight loss in 2 months   Height:   Ht Readings from Last 1 Encounters:  12/16/14 5\' 4"  (1.626 m)    Weight:   Wt Readings  from Last 1 Encounters:  12/16/14 157 lb 9.6 oz (71.487 kg)    Wt Readings from Last 10 Encounters:  12/16/14 157 lb 9.6 oz (71.487 kg)  12/15/14 158 lb (71.668 kg)  12/08/14 165 lb 6 oz (75.014 kg)  12/02/14 168 lb 7 oz (76.403 kg)  11/21/14 158 lb 11.2 oz (71.986 kg)  11/12/14 166 lb 5.4 oz (75.45 kg)  11/10/14 165 lb (74.844 kg)  10/20/14 175 lb 14.8 oz (79.8 kg)  10/15/14 175 lb 11.3 oz (79.7 kg)  10/12/14 176 lb (79.833 kg)     BMI:  Body mass index is 27.04 kg/(m^2).  Estimated Nutritional Needs:   Kcal:  BEE: 1260kcals, TEE: (IF 1.1-1.3)(AF 1.2) 1663-1965kcals  Protein:  71-85g protein (1.0-1.2g/kg)    Fluid:  1775-2142mL of fluid (25-79mL/kg)  EDUCATION NEEDS:   No education needs identified at this time   [place], RD, LDN Pager [phone]

## 2014-12-17 NOTE — Progress Notes (Signed)
[place] @ [facility] Telephone:([phone]) [phone]  Fax:([phone]) [place]: Jun 14, 1955  MR#: [id number]  EXB#:[id number]  Patient Care Team: Ashok Norris, MD as PCP - General (Family Medicine)  CHIEF COMPLAINT:  No chief complaint on file.   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 abdominal paracentesis x1 negative for malignant cells(22nd September)  2.PET scan shows extensive disease with lymph node metastases and bone metastases T1 N0 M1 disease biopsy is consistent with adenocarcinoma GYN or lesion.  Most likely from the endometrial cancer.  Estrogen receptor positive.  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 abdominal paracentesis x1 negative for malignant cells(22nd September)  2.PET scan shows extensive disease with lymph node metastases and bone metastases T1 N0 M1 disease biopsy is consistent with adenocarcinoma GYN or lesion.  Most likely from the endometrial cancer.  Estrogen receptor positive.  HER-2/neu receptor pending(October, 2015)  3.patient started on chemotherapy with carboplatinum and Taxolfrom November 06, 2013 4.acute pulmonary embolism February of 2016 on xeralto. 5.  Because of insurance not paying for [place] patient has been switched over to Coumadin (May, 2016) 6.  Now on maintenance a Avastin (April, 2016)   6.  Progressing disease by CT scan and MRI scan (October, 2016) 7.  His and has finished radiation therapy to the spine    Oncology Flowsheet 11/27/2014 11/28/2014 11/29/2014 12/08/2014 12/15/2014 12/16/2014 12/17/2014  Day, Cycle - - - Day 1, Cycle 2 - - -  bevacizumab (AVASTIN) IV - - - - - - -  CISplatin (PLATINOL) IV - - - 50 mg/m2 - - -  denosumab (XGEVA) [place] - - - - - - -  dexamethasone (DECADRON) IV - - - [ 12 mg ] - - -  diphenhydrAMINE (BENADRYL) PO - - - - - - -  DOCEtaxel (TAXOTERE) IV - - - 60 mg/m2 - - -  enoxaparin (LOVENOX) [place] - - - - - 40 mg -  fosaprepitant (EMEND) IV - - - [ 150 mg ] - - -  ondansetron (ZOFRAN) IV - - - - 8 mg 4 mg 4 mg  palonosetron (ALOXI) IV - - - 0.25 mg - - -  pegfilgrastim (NEULASTA ONPRO KIT) [place] - - - 6 mg - - -  Tbo-Filgrastim (GRANIX) [place] 480 mcg 480 mcg 480 mcg - - - -    INTERVAL HISTORY:  59 year old lady with stage IV endometrial cancer recently admitted in the hospital with neutropenia.  According to patient after radiation therapy patient's pain has not improved.  Constant dull aching pain on the left side in the femur and pelvis.  In has improved after patient was asked to increase fentanyl patch.   Patient was admitted in the hospital  with hypotension, dehydration and low-grade fever and diarrhea.  Diarrhea is improved.  Appetite is getting better.  No nausea.  Patient had significant nausea yesterday but controlled with Zofran feeling somewhat stronger today REVIEW OF SYSTEMS:   Gen. Status: Patient was feeling extremely weak and tired.  No chills fever cold and clammy.  No fever Feeling still very weak but blood pressure is improved. GI: Diarrhea as described above.  No nausea or vomiting. Diarrhea continues patient has stopped taking any laxative but did not take any Imodium No rectal bleeding.  No abdominal pain.  GU no dysuria hematuria. Neurological system no headache no dizziness. GU: No dysuria hematuria musculoskeletal system  no bony pain all other  12  systems have been reviewed  PAST MEDICAL HISTORY: Past Medical History  Diagnosis Date  . Cancer ([place])   . Primary cancer of endometrium ([place]) 05/24/2014  . Hypertension   . Knee pain, bilateral   . Arthritis   . Renal insufficiency     Significant History/PMH:   Stage 4 peritoneal Cancer:    Cirrhosis:    GERD:    endometerial cancer:    Arthritis:    Hypertension:    Migraines:    Hysterectomy:    Cholecystectomy:    Dilation and Curretage: with hysteroscopy, Aug 2012 FAMILY HISTORY Family History  Problem Relation Age of Onset  . Breast cancer Sister 10  . Hypertension Sister   . Diabetes Mother   . Hypertension Mother       ADVANCED DIRECTIVES:does have advanced healthcare directive   HEALTH MAINTENANCE: Social History  Substance Use Topics  . Smoking status: Never Smoker   . Smokeless tobacco: None  . Alcohol Use: No    :  Allergies  Allergen Reactions  . Sulfa Antibiotics Anaphylaxis    Current Facility-Administered Medications  Medication Dose Route Frequency Provider Last Rate Last Dose  . 0.9 %  sodium chloride infusion   Intravenous Continuous Evlyn Kanner, NP 125 mL/hr at 12/17/14 5643    . acetaminophen (TYLENOL) tablet 650 mg  650 mg Oral Q4H PRN Evlyn Kanner, NP      . atenolol (TENORMIN) tablet 100 mg  100 mg Oral Daily Evlyn Kanner, NP   100 mg at 12/17/14 0934  . calcium-vitamin D (OSCAL WITH D) 500-200 MG-UNIT per tablet 1 tablet  1 tablet Oral BID Forest Gleason, MD   1 tablet at 12/17/14 0934  . ciprofloxacin (CIPRO) IVPB 400 mg  400 mg Intravenous Q12H Evlyn Kanner, NP   400 mg at 12/17/14 3295  . citalopram (CELEXA) tablet 40 mg  40 mg Oral Daily Evlyn Kanner, NP   40 mg at 12/17/14 0934  . enoxaparin (LOVENOX) injection 40 mg  40 mg Subcutaneous Q24H Evlyn Kanner, NP   40 mg at 12/16/14 1715  . famotidine (PEPCID) tablet 20 mg  20 mg Oral BID Evlyn Kanner, NP   20 mg at  12/17/14 0934  . feeding supplement (ENSURE ENLIVE) (ENSURE ENLIVE) liquid 237 mL  237 mL Oral TID WC Forest Gleason, MD   237 mL at 12/17/14 1301  . fentaNYL (DURAGESIC - dosed mcg/hr) 100 mcg  100 mcg Transdermal Q72H Evlyn Kanner, NP   100 mcg at 12/16/14 1714  . loperamide (IMODIUM) capsule 2 mg  2 mg Oral QID PRN Evlyn Kanner, NP      . metroNIDAZOLE (FLAGYL) IVPB 250 mg  250 mg Intravenous Q8H Forest Gleason, MD  250 mg at 12/17/14 0934  . mirtazapine (REMERON) tablet 15 mg  15 mg Oral QHS Evlyn Kanner, NP   15 mg at 12/16/14 2225  . ondansetron (ZOFRAN) injection 4 mg  4 mg Intravenous Q4H PRN Forest Gleason, MD   4 mg at 12/17/14 0659  . oxyCODONE (Oxy IR/ROXICODONE) immediate release tablet 5 mg  5 mg Oral Q6H PRN Evlyn Kanner, NP       Facility-Administered Medications Ordered in Other Encounters  Medication Dose Route Frequency Provider Last Rate Last Dose  . 0.9 %  sodium chloride infusion   Intravenous Once Forest Gleason, MD      . heparin lock flush 100 unit/mL  500 Units Intravenous Once Forest Gleason, MD      . heparin lock flush 100 unit/mL  500 Units Intravenous Once Forest Gleason, MD      . sodium chloride 0.9 % injection 10 mL  10 mL Intracatheter PRN Forest Gleason, MD   10 mL at 11/10/14 0837  . sodium chloride 0.9 % injection 10 mL  10 mL Intravenous PRN Forest Gleason, MD   10 mL at 12/08/14 0834  . sodium chloride 0.9 % injection 10 mL  10 mL Intravenous PRN Forest Gleason, MD   10 mL at 12/15/14 1417    OBJECTIVE:  Filed Vitals:   12/17/14 0500 12/17/14 1307  BP: 105/69 93/61  Pulse: 101 87  Temp: 98.2 F (36.8 C) 99.1 F (37.3 C)  Resp: 17 20     Body mass index is 27.04 kg/(m^2).    ECOG FS:1 - Symptomatic but completely ambulatory  PHYSICAL EXAM:  general status: Patient is feeling weak and tired.  No change in a performance status.  No chills.  No fever. HEENT: Alopecia.  No evidence of stomatitis Lungs: No cough or shortness of breath Cardiac: No  chest pain or paroxysmal nocturnal dyspnea GI: No nausea no vomiting no diarrhea no abdominal pain Skin: No rash For skin turgor clinical signs of dehydration Lower extremity no swelling Neurological system: No tingling.  No numbness.  No other focal signs Musculoskeletal system : Increasing pain not able to bear weight on the l left lower extremity. Difficulty in ambulation Recent hospitalization with neutropenia and fever  NEUROLOGICAL: As per history of present illness weakness in the left lower extremity PSYCH:  Less depressed LAB RESULTS:  Admission on 12/16/2014  Component Date Value Ref Range Status  . C Diff antigen 12/17/2014 NEGATIVE  NEGATIVE Final  . C Diff toxin 12/17/2014 NEGATIVE  NEGATIVE Final  . C Diff interpretation 12/17/2014 Negative for C. difficile   Final  . Specimen Description 12/16/2014 URINE, RANDOM   Final  . Special Requests 12/16/2014 Immunocompromised   Final  . Culture 12/16/2014 NO GROWTH < 12 HOURS   Final  . Report Status 12/16/2014 PENDING   Incomplete  . Color, Urine 12/16/2014 YELLOW* YELLOW Final  . APPearance 12/16/2014 CLEAR* CLEAR Final  . Glucose, UA 12/16/2014 NEGATIVE  NEGATIVE mg/dL Final  . Bilirubin Urine 12/16/2014 NEGATIVE  NEGATIVE Final  . Ketones, ur 12/16/2014 1+* NEGATIVE mg/dL Final  . Specific Gravity, Urine 12/16/2014 1.013  1.005 - 1.030 Final  . Hgb urine dipstick 12/16/2014 1+* NEGATIVE Final  . pH 12/16/2014 5.0  5.0 - 8.0 Final  . Protein, ur 12/16/2014 NEGATIVE  NEGATIVE mg/dL Final  . Nitrite 12/16/2014 NEGATIVE  NEGATIVE Final  . Leukocytes, UA 12/16/2014 NEGATIVE  NEGATIVE Final  . RBC / HPF 12/16/2014 0-5  0 -  5 RBC/hpf Final  . WBC, UA 12/16/2014 0-5  0 - 5 WBC/hpf Final  . Bacteria, UA 12/16/2014 NONE SEEN  NONE SEEN Final  . Squamous Epithelial / LPF 12/16/2014 0-5* NONE SEEN Final  . Mucous 12/16/2014 PRESENT   Final  . WBC 12/17/2014 8.9  3.6 - 11.0 K/uL Final  . RBC 12/17/2014 2.73* 3.80 - 5.20 MIL/uL  Final  . Hemoglobin 12/17/2014 8.1* 12.0 - 16.0 g/dL Final   RESULT REPEATED AND VERIFIED  . HCT 12/17/2014 24.5* 35.0 - 47.0 % Final  . MCV 12/17/2014 89.7  80.0 - 100.0 fL Final  . MCH 12/17/2014 29.5  26.0 - 34.0 pg Final  . MCHC 12/17/2014 32.9  32.0 - 36.0 g/dL Final  . RDW 12/17/2014 20.7* 11.5 - 14.5 % Final  . Platelets 12/17/2014 86* 150 - 440 K/uL Final  . Neutrophils Relative % 12/17/2014 63   Final  . Lymphocytes Relative 12/17/2014 12   Final  . Monocytes Relative 12/17/2014 7   Final  . Eosinophils Relative 12/17/2014 0   Final  . Basophils Relative 12/17/2014 0   Final  . Band Neutrophils 12/17/2014 15   Final  . Metamyelocytes Relative 12/17/2014 3   Final  . Myelocytes 12/17/2014 0   Final  . Promyelocytes Absolute 12/17/2014 0   Final  . Blasts 12/17/2014 0   Final  . nRBC 12/17/2014 0  0 /100 WBC Final  . Other 12/17/2014 0   Final  . Neutro Abs 12/17/2014 7.2* 1.4 - 6.5 K/uL Final  . Lymphs Abs 12/17/2014 1.1  1.0 - 3.6 K/uL Final  . Monocytes Absolute 12/17/2014 0.6  0.2 - 0.9 K/uL Final  . Eosinophils Absolute 12/17/2014 0.0  0 - 0.7 K/uL Final  . Basophils Absolute 12/17/2014 0.0  0 - 0.1 K/uL Final  . RBC Morphology 12/17/2014 MIXED RBC POPULATION   Final   POLYCHROMASIA PRESENT  . WBC Morphology 12/17/2014 TOXIC GRANULATION   Final  . Sodium 12/17/2014 137  135 - 145 mmol/L Final  . Potassium 12/17/2014 3.2* 3.5 - 5.1 mmol/L Final  . Chloride 12/17/2014 110  101 - 111 mmol/L Final  . CO2 12/17/2014 20* 22 - 32 mmol/L Final  . Glucose, Bld 12/17/2014 79  65 - 99 mg/dL Final  . BUN 12/17/2014 13  6 - 20 mg/dL Final  . Creatinine, Ser 12/17/2014 0.86  0.44 - 1.00 mg/dL Final  . Calcium 12/17/2014 6.3* 8.9 - 10.3 mg/dL Final   Comment: CRITICAL RESULT CALLED TO, READ BACK BY AND VERIFIED WITH DIEDRE MALCOLM AT 4270 ON 12/17/14.Marland KitchenMarland KitchenMonroe   . Total Protein 12/17/2014 5.0* 6.5 - 8.1 g/dL Final  . Albumin 12/17/2014 2.5* 3.5 - 5.0 g/dL Final  . AST 12/17/2014 18   15 - 41 U/L Final  . ALT 12/17/2014 13* 14 - 54 U/L Final  . Alkaline Phosphatase 12/17/2014 91  38 - 126 U/L Final  . Total Bilirubin 12/17/2014 0.5  0.3 - 1.2 mg/dL Final  . GFR calc non Af Amer 12/17/2014 >60  >60 mL/min Final  . GFR calc Af Amer 12/17/2014 >60  >60 mL/min Final   Comment: (NOTE) The eGFR has been calculated using the CKD EPI equation. This calculation has not been validated in all clinical situations. eGFR's persistently <60 mL/min signify possible Chronic Kidney Disease.   . Anion gap 12/17/2014 7  5 - 15 Final  . aPTT 12/17/2014 44* 24 - 36 seconds Final   Comment:  IF BASELINE aPTT IS ELEVATED, SUGGEST PATIENT RISK ASSESSMENT BE USED TO DETERMINE APPROPRIATE ANTICOAGULANT THERAPY.   Appointment on 12/16/2014  Component Date Value Ref Range Status  . WBC 12/16/2014 10.0  3.6 - 11.0 K/uL Final  . RBC 12/16/2014 3.44* 3.80 - 5.20 MIL/uL Final  . Hemoglobin 12/16/2014 10.1* 12.0 - 16.0 g/dL Final  . HCT 12/16/2014 29.9* 35.0 - 47.0 % Final  . MCV 12/16/2014 86.9  80.0 - 100.0 fL Final  . MCH 12/16/2014 29.3  26.0 - 34.0 pg Final  . MCHC 12/16/2014 33.7  32.0 - 36.0 g/dL Final  . RDW 12/16/2014 20.1* 11.5 - 14.5 % Final  . Platelets 12/16/2014 120* 150 - 440 K/uL Final  . Neutrophils Relative % 12/16/2014 72   Final  . Neutro Abs 12/16/2014 7.2* 1.4 - 6.5 K/uL Final  . Lymphocytes Relative 12/16/2014 16   Final  . Lymphs Abs 12/16/2014 1.6  1.0 - 3.6 K/uL Final  . Monocytes Relative 12/16/2014 11   Final  . Monocytes Absolute 12/16/2014 1.0* 0.2 - 0.9 K/uL Final  . Eosinophils Relative 12/16/2014 0   Final  . Eosinophils Absolute 12/16/2014 0.0  0 - 0.7 K/uL Final  . Basophils Relative 12/16/2014 1   Final  . Basophils Absolute 12/16/2014 0.1  0 - 0.1 K/uL Final  . Sodium 12/16/2014 132* 135 - 145 mmol/L Final  . Potassium 12/16/2014 3.8  3.5 - 5.1 mmol/L Final  . Chloride 12/16/2014 103  101 - 111 mmol/L Final  . CO2 12/16/2014 17* 22 - 32 mmol/L  Final  . Glucose, Bld 12/16/2014 103* 65 - 99 mg/dL Final  . BUN 12/16/2014 28* 6 - 20 mg/dL Final  . Creatinine, Ser 12/16/2014 1.14* 0.44 - 1.00 mg/dL Final  . Calcium 12/16/2014 7.3* 8.9 - 10.3 mg/dL Final  . Total Protein 12/16/2014 6.3* 6.5 - 8.1 g/dL Final  . Albumin 12/16/2014 3.2* 3.5 - 5.0 g/dL Final  . AST 12/16/2014 22  15 - 41 U/L Final  . ALT 12/16/2014 15  14 - 54 U/L Final  . Alkaline Phosphatase 12/16/2014 119  38 - 126 U/L Final  . Total Bilirubin 12/16/2014 0.5  0.3 - 1.2 mg/dL Final  . GFR calc non Af Amer 12/16/2014 51* >60 mL/min Final  . GFR calc Af Amer 12/16/2014 59* >60 mL/min Final   Comment: (NOTE) The eGFR has been calculated using the CKD EPI equation. This calculation has not been validated in all clinical situations. eGFR's persistently <60 mL/min signify possible Chronic Kidney Disease.   . Anion gap 12/16/2014 12  5 - 15 Final  . Magnesium 12/16/2014 1.1* 1.7 - 2.4 mg/dL Final  . Blood Bank Specimen 12/16/2014 SAMPLE AVAILABLE FOR TESTING   Final  . Sample Expiration 12/16/2014 12/19/2014   Final  Infusion on 12/15/2014  Component Date Value Ref Range Status  . Sodium 12/15/2014 130* 135 - 145 mmol/L Final  . Potassium 12/15/2014 4.3  3.5 - 5.1 mmol/L Final  . Chloride 12/15/2014 100* 101 - 111 mmol/L Final  . CO2 12/15/2014 17* 22 - 32 mmol/L Final  . Glucose, Bld 12/15/2014 113* 65 - 99 mg/dL Final  . BUN 12/15/2014 28* 6 - 20 mg/dL Final  . Creatinine, Ser 12/15/2014 1.25* 0.44 - 1.00 mg/dL Final  . Calcium 12/15/2014 7.6* 8.9 - 10.3 mg/dL Final  . Total Protein 12/15/2014 6.6  6.5 - 8.1 g/dL Final  . Albumin 12/15/2014 3.5  3.5 - 5.0 g/dL Final  . AST 12/15/2014 27  15 - 41 U/L Final  . ALT 12/15/2014 18  14 - 54 U/L Final  . Alkaline Phosphatase 12/15/2014 125  38 - 126 U/L Final  . Total Bilirubin 12/15/2014 0.8  0.3 - 1.2 mg/dL Final  . GFR calc non Af Amer 12/15/2014 45* >60 mL/min Final  . GFR calc Af Amer 12/15/2014 53* >60 mL/min  Final   Comment: (NOTE) The eGFR has been calculated using the CKD EPI equation. This calculation has not been validated in all clinical situations. eGFR's persistently <60 mL/min signify possible Chronic Kidney Disease.   . Anion gap 12/15/2014 13  5 - 15 Final  . Magnesium 12/15/2014 1.2* 1.7 - 2.4 mg/dL Final  . WBC 12/15/2014 7.4  3.6 - 11.0 K/uL Final  . RBC 12/15/2014 3.75* 3.80 - 5.20 MIL/uL Final  . Hemoglobin 12/15/2014 10.8* 12.0 - 16.0 g/dL Final  . HCT 12/15/2014 32.8* 35.0 - 47.0 % Final  . MCV 12/15/2014 87.4  80.0 - 100.0 fL Final  . MCH 12/15/2014 28.9  26.0 - 34.0 pg Final  . MCHC 12/15/2014 33.0  32.0 - 36.0 g/dL Final  . RDW 12/15/2014 20.2* 11.5 - 14.5 % Final  . Platelets 12/15/2014 119* 150 - 440 K/uL Final  . Neutrophils Relative % 12/15/2014 59   Final  . Neutro Abs 12/15/2014 4.5  1.4 - 6.5 K/uL Final  . Lymphocytes Relative 12/15/2014 27   Final  . Lymphs Abs 12/15/2014 2.0  1.0 - 3.6 K/uL Final  . Monocytes Relative 12/15/2014 13   Final  . Monocytes Absolute 12/15/2014 0.9  0.2 - 0.9 K/uL Final  . Eosinophils Relative 12/15/2014 0   Final  . Eosinophils Absolute 12/15/2014 0.0  0 - 0.7 K/uL Final  . Basophils Relative 12/15/2014 1   Final  . 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 Physiotherapy  medication to oral medication. Stool for Clostridium difficile toxin is negative. Discontinue Flagyl  Proceed with physiotherapy and occupational therapy Proceed with a Coumadin 5 mg from tomorrow morning if  pro time is in reasonable range Patient expressed understanding and was in agreement with this plan. She also understands that She can call clinic at any time with any questions, concerns, or complaints.    Primary cancer of endometrium   Staging form: Corpus Uteri - Carcinoma, AJCC 7th Edition     Clinical: Stage IVB (T1a, N0, M1) - Signed by Forest Gleason, MD on 05/24/2014     Pathologic: No stage assigned - Marni Griffon, MD   12/17/2014 2:49 PM

## 2014-12-17 NOTE — Plan of Care (Signed)
Problem: Bowel/Gastric: Goal: Will not experience complications related to bowel motility Outcome: Progressing Pt alert and oriented. No c/o pain nor c/o nausea/vominting. Up to the [facility] with one assist. Continues on iv cipro and flagyl. On isolation to r/o C-Diff. No BM's this shift. Continue to monitor.

## 2014-12-17 NOTE — Care Management (Signed)
Admitted to [facility] with the diagnosis of endometrial cancer/nausea/vomitting. Sister is Gildardo Pounds [phone] or [phone]). Lives in an RV on sister's property. Last seen Dr. Rutherford Nail in August. See Dr. Jeb Levering at the [facility]. Last chemotherapy treatment was Monday a week ago. Chemotherapy is on Hold for now. No Life Alert. Followed by [place] for physical therapy. Seen November 10th and 29th per Floydene Flock, [facility] representative. No Life Alert.  Uses a straight and rolling walker to aid in ambulation. Takes care of all basic activities of daily living herself. Sister will transport. Shelbie Ammons RN MSN CCM

## 2014-12-17 NOTE — Evaluation (Signed)
Physical Therapy Evaluation Patient Details Name: Emily Zamora MRN: [id number] DOB: 01-16-1955 Today's Date: 12/17/2014   History of Present Illness  Patient is a 59 y/o female that presents with hypotension and generalized weakness. 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 She has recently had palliative radiation to L hip for pain control to increase mobility.   Clinical Impression  Patient has been limited in mobility for some time due to L hip pain, likely of metastatic origin. She limits herself to TTWB on LLE, and has done so for 6 months reporting 1 fall in that time. Compared to her baseline, today she presents with mild deconditioning/fatigue. She has a great family set up with 24/7 assistance from family that live nearby and has been seeing a PT at home. Patient appears safe with ambulation in this session, she is able to turn with no loss of balance though she fatigues quickly. This appears near her baseline, though given her high risk of injury from fall she would continue to benefit from PT services.     Follow Up Recommendations Home health PT    Equipment Recommendations       Recommendations for Other Services       Precautions / Restrictions Precautions Precautions: Fall Restrictions Weight Bearing Restrictions: No Other Position/Activity Restrictions: No formal WBing orders on L hip, patient self limits and essentially goes TTWB on LLE.       Mobility  Bed Mobility Overal bed mobility: Needs Assistance Bed Mobility: Supine to Sit;Sit to Supine     Supine to sit: Independent Sit to supine: Min assist   General bed mobility comments: Patient requires minimal assistance to bring LLE back on to bed from dangling. She is able to transition supine to sit with no deficits.   Transfers Overall transfer level: Needs assistance Equipment used: Rolling walker (2 wheeled) Transfers: Sit to/from Stand Sit to Stand:  Supervision         General transfer comment: Appropriate speed, hand placement. She provides minimal WBing through LLE.   Ambulation/Gait Ambulation/Gait assistance: Supervision Ambulation Distance (Feet): 30 Feet Assistive device: Rolling walker (2 wheeled)     Gait velocity interpretation: Below normal speed for age/gender General Gait Details: Patient ambulates with step through pattern with TTWBing through LLE secondary to pain. No formal WBing orders, though patient is aware there is believed to be "microfractures" in her pelvis and possibly hip. She has been ambulating like this due to pain for at least 6 months.   Stairs            Wheelchair Mobility    Modified Rankin (Stroke Patients Only)       Balance Overall balance assessment: No apparent balance deficits (not formally assessed) (She is just limited primarily by fatigue for balance purposes.)                                           Pertinent Vitals/Pain Pain Assessment: Faces Faces Pain Scale: Hurts even more Pain Location: L hip with WBing  Pain Descriptors / Indicators: Aching;Sharp Pain Intervention(s): Limited activity within patient's tolerance;Monitored during session;Repositioned    Home Living Family/patient expects to be discharged to:: Private residence Living Arrangements: Other (Comment);Alone (Lives on sister's property. Someone is available or with her 24/7) Available Help at Discharge: Family;Available 24 hours/day Type of Home:  (  RV) Home Access: Stairs to enter   Entrance Stairs-Number of Steps: 2 (Has assistance and uses standard walker to ascend) Home Layout: One level (2 steps inside) Home Equipment: Walker - 2 wheels;Walker - standard;Walker - 4 wheels      Prior Function Level of Independence: Independent with assistive device(s)         Comments: Patient has been ambulating with RW and TTWB with 1 fall in the last 6 months.      Hand Dominance         Extremity/Trunk Assessment   Upper Extremity Assessment: Overall WFL for tasks assessed           Lower Extremity Assessment: LLE deficits/detail         Communication   Communication: No difficulties  Cognition Arousal/Alertness: Awake/alert Behavior During Therapy: WFL for tasks assessed/performed Overall Cognitive Status: Within Functional Limits for tasks assessed                      General Comments      Exercises        Assessment/Plan    PT Assessment Patient needs continued PT services  PT Diagnosis Difficulty walking;Generalized weakness   PT Problem List Decreased strength;Decreased mobility;Decreased safety awareness;Pain;Decreased balance;Decreased activity tolerance  PT Treatment Interventions DME instruction;Therapeutic activities;Therapeutic exercise;Gait training;Balance training;Patient/family education;Wheelchair mobility training   PT Goals (Current goals can be found in the Care Plan section) Acute Rehab PT Goals Patient Stated Goal: To return home PT Goal Formulation: With patient/family Time For Goal Achievement: 12/31/14 Potential to Achieve Goals: Fair    Frequency Min 2X/week   Barriers to discharge Decreased caregiver support Patient will need to have assistance for mobility.     Co-evaluation               End of Session Equipment Utilized During Treatment: Gait belt Activity Tolerance: Patient limited by pain Patient left: in bed;with bed alarm set;with call bell/phone within reach Nurse Communication: Mobility status;Weight bearing status         Time: 1555-1610 PT Time Calculation (min) (ACUTE ONLY): 15 min   Charges:   PT Evaluation $Initial PT Evaluation Tier I: 1 Procedure     PT G Codes:       Kerman Passey, PT, DPT    12/17/2014, 5:05 PM

## 2014-12-17 NOTE — Progress Notes (Signed)
Reported to Dr Oliva Bustard calcium level 6.3

## 2014-12-17 NOTE — Progress Notes (Signed)
Regular diet per Dr Oliva Bustard

## 2014-12-18 ENCOUNTER — Ambulatory Visit: Payer: No Typology Code available for payment source

## 2014-12-18 DIAGNOSIS — D709 Neutropenia, unspecified: Secondary | ICD-10-CM

## 2014-12-18 LAB — URINE CULTURE: Culture: 50000

## 2014-12-18 LAB — BASIC METABOLIC PANEL
ANION GAP: 4 — AB (ref 5–15)
BUN: 8 mg/dL (ref 6–20)
CALCIUM: 6.5 mg/dL — AB (ref 8.9–10.3)
CHLORIDE: 109 mmol/L (ref 101–111)
CO2: 22 mmol/L (ref 22–32)
Creatinine, Ser: 0.76 mg/dL (ref 0.44–1.00)
GFR calc Af Amer: >60 mL/min (ref >60)
GFR calc non Af Amer: >60 mL/min (ref >60)
GLUCOSE: 81 mg/dL (ref 65–99)
POTASSIUM: 3.1 mmol/L — AB (ref 3.5–5.1)
Sodium: 135 mmol/L (ref 135–145)

## 2014-12-18 LAB — PROTIME-INR
INR: 2.09
PROTHROMBIN TIME: 23.3 s — AB (ref 11.4–15.0)

## 2014-12-18 MED ORDER — WARFARIN SODIUM 5 MG PO TABS: 5.0000 mg | ORAL_TABLET | Freq: Every day | ORAL | Status: DC

## 2014-12-18 MED ORDER — BOOST / RESOURCE BREEZE PO LIQD
1.0000 | Freq: Three times a day (TID) | ORAL | Status: DC
  Administered 2014-12-18 – 2014-12-19 (×3): 1 via ORAL

## 2014-12-18 MED ORDER — WARFARIN - PHYSICIAN DOSING INPATIENT
Freq: Every day | Status: DC
Start: 2014-12-18 — End: 2014-12-19
  Administered 2014-12-18: 18:00:00

## 2014-12-18 NOTE — Progress Notes (Signed)
[place] @ [facility] [phone]  Fax:([phone]) [place]: 1955-08-28  MR#: [id number]  EQA#:[id number]  Patient Care Team: Ashok Norris, MD as PCP - General (Family Medicine)  CHIEF COMPLAINT:  No chief complaint on file.   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 abdominal paracentesis x1 negative for malignant cells(22nd September)  2.PET scan shows extensive disease with lymph node metastases and bone metastases T1 N0 M1 disease biopsy is consistent with adenocarcinoma GYN or lesion.  Most likely from the endometrial cancer.  Estrogen receptor positive.  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 abdominal paracentesis x1 negative for malignant cells(22nd September)  2.PET scan shows extensive disease with lymph node metastases and bone metastases T1 N0 M1 disease biopsy is consistent with adenocarcinoma GYN or lesion.  Most likely from the endometrial cancer.  Estrogen receptor positive.  HER-2/neu receptor pending(October, 2015)  3.patient started on chemotherapy with carboplatinum and Taxolfrom November 06, 2013 4.acute pulmonary embolism February of 2016 on xeralto. 5.  Because of insurance not paying for [place] patient has been switched over to Coumadin (May, 2016) 6.  Now on maintenance a Avastin (April, 2016)   6.  Progressing disease by CT scan and MRI scan (October, 2016) 7.  His and has finished radiation therapy to the spine    Oncology Flowsheet 11/27/2014 11/28/2014 11/29/2014 12/08/2014 12/15/2014 12/16/2014 12/17/2014  Day, Cycle - - - Day 1, Cycle 2 - - -  bevacizumab (AVASTIN) IV - - - - - - -  CISplatin (PLATINOL) IV - - - 50 mg/m2 - - -  denosumab (XGEVA) [place] - - - - - - -  dexamethasone (DECADRON) IV - - - [ 12 mg ] - - -  diphenhydrAMINE (BENADRYL) PO - - - - - - -  DOCEtaxel (TAXOTERE) IV - - - 60 mg/m2 - - -  enoxaparin (LOVENOX) [place] - - - - - 40 mg 40 mg  fosaprepitant (EMEND) IV - - - [ 150 mg ] - - -  ondansetron (ZOFRAN) IV - - - - 8 mg 4 mg 4 mg  palonosetron (ALOXI) IV - - - 0.25 mg - - -  pegfilgrastim (NEULASTA ONPRO KIT) [place] - - - 6 mg - - -  Tbo-Filgrastim (GRANIX)  480 mcg 480 mcg 480 mcg - - - -    INTERVAL HISTORY:  59 year old lady with stage IV endometrial cancer recently admitted in the hospital with neutropenia.  According to patient after radiation therapy patient's pain has not improved.  Constant dull aching pain on the left side in the femur and pelvis.  In has improved after patient was asked to increase fentanyl patch.   Patient was admitted in the  hospital with hypotension, dehydration and low-grade fever and diarrhea.  Diarrhea is improved.  Appetite is getting better.  No nausea.  Patient had significant nausea yesterday but controlled with Zofran .  feeling somewhat stronger today.  December 17, 2014  Diarrhea has improved Patient is on now regular diet. Making some progress walking with the help of walker Hemoglobin has dropped to 7.7  Total time is with INR of 2.09 REVIEW OF SYSTEMS:   Gen. Status: Patient was feeling extremely weak and tired.  No chills fever cold and clammy.  No fever Feeling still very weak but blood pressure is improved. GI: Diarrhea as described above.  No nausea or vomiting. Diarrhea continues patient has stopped  taking any laxative but did not take any Imodium No rectal bleeding.  No abdominal pain.  GU no dysuria hematuria. Neurological system no headache no dizziness. GU: No dysuria hematuria musculoskeletal system no bony pain all other  12  systems have been reviewed  PAST MEDICAL HISTORY: Past Medical History  Diagnosis Date  . Cancer ([place])   . Primary cancer of endometrium ([place]) 05/24/2014  . Hypertension   . Knee pain, bilateral   . Arthritis   . Renal insufficiency     Significant History/PMH:   Stage 4 peritoneal Cancer:    Cirrhosis:    GERD:    endometerial cancer:    Arthritis:    Hypertension:    Migraines:    Hysterectomy:    Cholecystectomy:    Dilation and Curretage: with hysteroscopy, Aug 2012 FAMILY HISTORY Family History  Problem Relation Age of Onset  . Breast cancer Sister 13  . Hypertension Sister   . Diabetes Mother   . Hypertension Mother       ADVANCED DIRECTIVES:does have advanced healthcare directive   HEALTH MAINTENANCE: Social History  Substance Use Topics  . Smoking status: Never Smoker   . Smokeless tobacco: None  . Alcohol Use: No    :  Allergies  Allergen Reactions  . Sulfa Antibiotics Anaphylaxis    Current Facility-Administered Medications  Medication Dose Route Frequency Provider Last Rate Last Dose  . 0.9 %  sodium chloride infusion   Intravenous Continuous Evlyn Kanner, NP 125 mL/hr at 12/18/14 1509    . acetaminophen (TYLENOL) tablet 650 mg  650 mg Oral Q4H PRN Evlyn Kanner, NP      . atenolol (TENORMIN) tablet 100 mg  100 mg Oral Daily Evlyn Kanner, NP   100 mg at 12/18/14 0931  . calcium-vitamin D (OSCAL WITH D) 500-200 MG-UNIT per tablet 1 tablet  1 tablet Oral BID Forest Gleason, MD   1 tablet at 12/18/14 0931  . citalopram (CELEXA) tablet 40 mg  40 mg Oral Daily Evlyn Kanner, NP   40 mg at 12/18/14 0931  . famotidine (PEPCID) tablet 20 mg  20 mg Oral BID Evlyn Kanner, NP   20 mg at 12/18/14  0931  . feeding supplement (BOOST / RESOURCE BREEZE) liquid 1 Container  1 Container Oral TID WC Forest Gleason, MD   1 Container at 12/18/14 1200  . fentaNYL (DURAGESIC - dosed mcg/hr) 100 mcg  100 mcg Transdermal Q72H Evlyn Kanner, NP   100 mcg at 12/16/14 1714  . loperamide (IMODIUM) capsule 2 mg  2 mg Oral QID PRN Evlyn Kanner, NP      . mirtazapine (REMERON) tablet 15 mg  15 mg Oral QHS Evlyn Kanner, NP   15 mg at 12/17/14 2238  .  ondansetron (ZOFRAN) injection 4 mg  4 mg Intravenous Q4H PRN Forest Gleason, MD   4 mg at 12/17/14 0659  . oxyCODONE (Oxy IR/ROXICODONE) immediate release tablet 5 mg  5 mg Oral Q6H PRN Evlyn Kanner, NP      . Derrill Memo ON 12/19/2014] warfarin (COUMADIN) tablet 5 mg  5 mg Oral q1800 Forest Gleason, MD      . Warfarin - Physician Dosing Inpatient   Does not apply [id number] Forest Gleason, MD       Facility-Administered Medications Ordered in Other Encounters  Medication Dose Route Frequency Provider Last Rate Last Dose  . 0.9 %  sodium chloride infusion   Intravenous Once Forest Gleason, MD      . heparin lock flush 100 unit/mL  500 Units Intravenous Once Forest Gleason, MD      . heparin lock flush 100 unit/mL  500 Units Intravenous Once Forest Gleason, MD      . sodium chloride 0.9 % injection 10 mL  10 mL Intracatheter PRN Forest Gleason, MD   10 mL at 11/10/14 0837  . sodium chloride 0.9 % injection 10 mL  10 mL Intravenous PRN Forest Gleason, MD   10 mL at 12/08/14 0834  . sodium chloride 0.9 % injection 10 mL  10 mL Intravenous PRN Forest Gleason, MD   10 mL at 12/15/14 1417    OBJECTIVE:  Filed Vitals:   12/18/14 0935 12/18/14 1229  BP: 104/74 98/66  Pulse: 99 92  Temp:  98.6 F (37 C)  Resp:  20     Body mass index is 27.04 kg/(m^2).    ECOG FS:1 - Symptomatic but completely ambulatory  PHYSICAL EXAM:  general status: Patient is feeling weak and tired.  No change in a performance status.  No chills.  No fever. HEENT: Alopecia.  No evidence of  stomatitis Lungs: No cough or shortness of breath Cardiac: No chest pain or paroxysmal nocturnal dyspnea GI: No nausea no vomiting no diarrhea no abdominal pain Skin: No rash For skin turgor clinical signs of dehydration Lower extremity no swelling Neurological system: No tingling.  No numbness.  No other focal signs Musculoskeletal system : Increasing pain not able to bear weight on the l left lower extremity. Difficulty in ambulation Recent hospitalization with neutropenia and fever  NEUROLOGICAL: As per history of present illness weakness in the left lower extremity PSYCH:  Less depressed LAB RESULTS:  Admission on 12/16/2014  Component Date Value Ref Range Status  . C Diff antigen 12/17/2014 NEGATIVE  NEGATIVE Final  . C Diff toxin 12/17/2014 NEGATIVE  NEGATIVE Final  . C Diff interpretation 12/17/2014 Negative for C. difficile   Final  . Specimen Description 12/16/2014 URINE, RANDOM   Final  . Special Requests 12/16/2014 Immunocompromised   Final  . Culture 12/16/2014    Final                   Value:50,000 COLONIES/mL GRAM POSITIVE RODS NO FURTHER ID   . Report Status 12/16/2014 12/18/2014 FINAL   Final  . Color, Urine 12/16/2014 YELLOW* YELLOW Final  . APPearance 12/16/2014 CLEAR* CLEAR Final  . Glucose, UA 12/16/2014 NEGATIVE  NEGATIVE mg/dL Final  . Bilirubin Urine 12/16/2014 NEGATIVE  NEGATIVE Final  . Ketones, ur 12/16/2014 1+* NEGATIVE mg/dL Final  . Specific Gravity, Urine 12/16/2014 1.013  1.005 - 1.030 Final  . Hgb urine dipstick 12/16/2014 1+* NEGATIVE Final  . pH 12/16/2014 5.0  5.0 - 8.0 Final  . Protein,  ur 12/16/2014 NEGATIVE  NEGATIVE mg/dL Final  . Nitrite 12/16/2014 NEGATIVE  NEGATIVE Final  . Leukocytes, UA 12/16/2014 NEGATIVE  NEGATIVE Final  . RBC / HPF 12/16/2014 0-5  0 - 5 RBC/hpf Final  . WBC, UA 12/16/2014 0-5  0 - 5 WBC/hpf Final  . Bacteria, UA 12/16/2014 NONE SEEN  NONE SEEN Final  . Squamous Epithelial / LPF 12/16/2014 0-5* NONE SEEN Final   . Mucous 12/16/2014 PRESENT   Final  . Specimen Description 12/16/2014 BLOOD LEFT ASSIST CONTROL   Final  . Special Requests 12/16/2014 BOTTLES DRAWN AEROBIC AND ANAEROBIC  8CC AERO, [place] ANAERO   Final  . Culture 12/16/2014 NO GROWTH 2 DAYS   Final  . Report Status 12/16/2014 PENDING   Incomplete  . Specimen Description 12/16/2014 BLOOD RIGHT ASSIST CONTROL   Final  . Special Requests 12/16/2014 BOTTLES DRAWN AEROBIC AND ANAEROBIC  8CC AERO, 8CC ANAERO   Final  . Culture 12/16/2014 NO GROWTH 2 DAYS   Final  . Report Status 12/16/2014 PENDING   Incomplete  . Specimen Description 12/17/2014 STOOL   Final  . Special Requests 12/17/2014 Immunocompromised   Final  . Culture 12/17/2014    Final                   Value:NO SALMONELLA OR SHIGELLA ISOLATED No Pathogenic E. coli detected NO CAMPYLOBACTER DETECTED   . Report Status 12/17/2014 PENDING   Incomplete  . WBC 12/17/2014 8.9  3.6 - 11.0 K/uL Final  . RBC 12/17/2014 2.73* 3.80 - 5.20 MIL/uL Final  . Hemoglobin 12/17/2014 8.1* 12.0 - 16.0 g/dL Final   RESULT REPEATED AND VERIFIED  . HCT 12/17/2014 24.5* 35.0 - 47.0 % Final  . MCV 12/17/2014 89.7  80.0 - 100.0 fL Final  . MCH 12/17/2014 29.5  26.0 - 34.0 pg Final  . MCHC 12/17/2014 32.9  32.0 - 36.0 g/dL Final  . RDW 12/17/2014 20.7* 11.5 - 14.5 % Final  . Platelets 12/17/2014 86* 150 - 440 K/uL Final  . Neutrophils Relative % 12/17/2014 63   Final  . Lymphocytes Relative 12/17/2014 12   Final  . Monocytes Relative 12/17/2014 7   Final  . Eosinophils Relative 12/17/2014 0   Final  . Basophils Relative 12/17/2014 0   Final  . Band Neutrophils 12/17/2014 15   Final  . Metamyelocytes Relative 12/17/2014 3   Final  . Myelocytes 12/17/2014 0   Final  . Promyelocytes Absolute 12/17/2014 0   Final  . Blasts 12/17/2014 0   Final  . nRBC 12/17/2014 0  0 /100 WBC Final  . Other 12/17/2014 0   Final  . Neutro Abs 12/17/2014 7.2* 1.4 - 6.5 K/uL Final  . Lymphs Abs 12/17/2014 1.1  1.0 - 3.6  K/uL Final  . Monocytes Absolute 12/17/2014 0.6  0.2 - 0.9 K/uL Final  . Eosinophils Absolute 12/17/2014 0.0  0 - 0.7 K/uL Final  . Basophils Absolute 12/17/2014 0.0  0 - 0.1 K/uL Final  . RBC Morphology 12/17/2014 MIXED RBC POPULATION   Final   POLYCHROMASIA PRESENT  . WBC Morphology 12/17/2014 TOXIC GRANULATION   Final  . Sodium 12/17/2014 137  135 - 145 mmol/L Final  . Potassium 12/17/2014 3.2* 3.5 - 5.1 mmol/L Final  . Chloride 12/17/2014 110  101 - 111 mmol/L Final  . CO2 12/17/2014 20* 22 - 32 mmol/L Final  . Glucose, Bld 12/17/2014 79  65 - 99 mg/dL Final  . BUN 12/17/2014 13  6 -  20 mg/dL Final  . Creatinine, Ser 12/17/2014 0.86  0.44 - 1.00 mg/dL Final  . Calcium 12/17/2014 6.3* 8.9 - 10.3 mg/dL Final   Comment: CRITICAL RESULT CALLED TO, READ BACK BY AND VERIFIED WITH DIEDRE MALCOLM AT 3151 ON 12/17/14.Marland KitchenMarland KitchenMayfair   . Total Protein 12/17/2014 5.0* 6.5 - 8.1 g/dL Final  . Albumin 12/17/2014 2.5* 3.5 - 5.0 g/dL Final  . AST 12/17/2014 18  15 - 41 U/L Final  . ALT 12/17/2014 13* 14 - 54 U/L Final  . Alkaline Phosphatase 12/17/2014 91  38 - 126 U/L Final  . Total Bilirubin 12/17/2014 0.5  0.3 - 1.2 mg/dL Final  . GFR calc non Af Amer 12/17/2014 >60  >60 mL/min Final  . GFR calc Af Amer 12/17/2014 >60  >60 mL/min Final   Comment: (NOTE) The eGFR has been calculated using the CKD EPI equation. This calculation has not been validated in all clinical situations. eGFR's persistently <60 mL/min signify possible Chronic Kidney Disease.   . Anion gap 12/17/2014 7  5 - 15 Final  . aPTT 12/17/2014 44* 24 - 36 seconds Final   Comment:        IF BASELINE aPTT IS ELEVATED, SUGGEST PATIENT RISK ASSESSMENT BE USED TO DETERMINE APPROPRIATE ANTICOAGULANT THERAPY.   . WBC 12/17/2014 11.0  3.6 - 11.0 K/uL Final  . RBC 12/17/2014 2.66* 3.80 - 5.20 MIL/uL Final  . Hemoglobin 12/17/2014 7.7* 12.0 - 16.0 g/dL Final  . HCT 12/17/2014 23.7* 35.0 - 47.0 % Final  . MCV 12/17/2014 89.1  80.0 - 100.0  fL Final  . MCH 12/17/2014 29.1  26.0 - 34.0 pg Final  . MCHC 12/17/2014 32.7  32.0 - 36.0 g/dL Final  . RDW 12/17/2014 20.8* 11.5 - 14.5 % Final  . Platelets 12/17/2014 88* 150 - 440 K/uL Final  . Neutrophils Relative % 12/17/2014 84   Final  . Lymphocytes Relative 12/17/2014 9   Final  . Monocytes Relative 12/17/2014 7   Final  . Eosinophils Relative 12/17/2014 0   Final  . Basophils Relative 12/17/2014 0   Final  . Neutro Abs 12/17/2014 9.2* 1.4 - 6.5 K/uL Final  . Lymphs Abs 12/17/2014 1.0  1.0 - 3.6 K/uL Final  . Monocytes Absolute 12/17/2014 0.8  0.2 - 0.9 K/uL Final  . Eosinophils Absolute 12/17/2014 0.0  0 - 0.7 K/uL Final  . Basophils Absolute 12/17/2014 0.0  0 - 0.1 K/uL Final  . RBC Morphology 12/17/2014 POLYCHROMASIA PRESENT   Final   HYPOCHROMIC  . WBC Morphology 12/17/2014 TOXIC GRANULATION   Final  . Prothrombin Time 12/18/2014 23.3* 11.4 - 15.0 seconds Final  . INR 12/18/2014 2.09   Final  . Sodium 12/18/2014 135  135 - 145 mmol/L Final  . Potassium 12/18/2014 3.1* 3.5 - 5.1 mmol/L Final  . Chloride 12/18/2014 109  101 - 111 mmol/L Final  . CO2 12/18/2014 22  22 - 32 mmol/L Final  . Glucose, Bld 12/18/2014 81  65 - 99 mg/dL Final  . BUN 12/18/2014 8  6 - 20 mg/dL Final  . Creatinine, Ser 12/18/2014 0.76  0.44 - 1.00 mg/dL Final  . Calcium 12/18/2014 6.5* 8.9 - 10.3 mg/dL Final  . GFR calc non Af Amer 12/18/2014 >60  >60 mL/min Final  . GFR calc Af Amer 12/18/2014 >60  >60 mL/min Final   Comment: (NOTE) The eGFR has been calculated using the CKD EPI equation. This calculation has not been validated in all clinical situations. eGFR's persistently <60  mL/min signify possible Chronic Kidney Disease.   . Anion gap 12/18/2014 4* 5 - 15 Final  Appointment on 12/16/2014  Component Date Value Ref Range Status  . WBC 12/16/2014 10.0  3.6 - 11.0 K/uL Final  . RBC 12/16/2014 3.44* 3.80 - 5.20 MIL/uL Final  . Hemoglobin 12/16/2014 10.1* 12.0 - 16.0 g/dL Final  . HCT  12/16/2014 29.9* 35.0 - 47.0 % Final  . MCV 12/16/2014 86.9  80.0 - 100.0 fL Final  . MCH 12/16/2014 29.3  26.0 - 34.0 pg Final  . MCHC 12/16/2014 33.7  32.0 - 36.0 g/dL Final  . RDW 12/16/2014 20.1* 11.5 - 14.5 % Final  . Platelets 12/16/2014 120* 150 - 440 K/uL Final  . Neutrophils Relative % 12/16/2014 72   Final  . Neutro Abs 12/16/2014 7.2* 1.4 - 6.5 K/uL Final  . Lymphocytes Relative 12/16/2014 16   Final  . Lymphs Abs 12/16/2014 1.6  1.0 - 3.6 K/uL Final  . Monocytes Relative 12/16/2014 11   Final  . Monocytes Absolute 12/16/2014 1.0* 0.2 - 0.9 K/uL Final  . Eosinophils Relative 12/16/2014 0   Final  . Eosinophils Absolute 12/16/2014 0.0  0 - 0.7 K/uL Final  . Basophils Relative 12/16/2014 1   Final  . Basophils Absolute 12/16/2014 0.1  0 - 0.1 K/uL Final  . Sodium 12/16/2014 132* 135 - 145 mmol/L Final  . Potassium 12/16/2014 3.8  3.5 - 5.1 mmol/L Final  . Chloride 12/16/2014 103  101 - 111 mmol/L Final  . CO2 12/16/2014 17* 22 - 32 mmol/L Final  . Glucose, Bld 12/16/2014 103* 65 - 99 mg/dL Final  . BUN 12/16/2014 28* 6 - 20 mg/dL Final  . Creatinine, Ser 12/16/2014 1.14* 0.44 - 1.00 mg/dL Final  . Calcium 12/16/2014 7.3* 8.9 - 10.3 mg/dL Final  . Total Protein 12/16/2014 6.3* 6.5 - 8.1 g/dL Final  . Albumin 12/16/2014 3.2* 3.5 - 5.0 g/dL Final  . AST 12/16/2014 22  15 - 41 U/L Final  . ALT 12/16/2014 15  14 - 54 U/L Final  . Alkaline Phosphatase 12/16/2014 119  38 - 126 U/L Final  . Total Bilirubin 12/16/2014 0.5  0.3 - 1.2 mg/dL Final  . GFR calc non Af Amer 12/16/2014 51* >60 mL/min Final  . GFR calc Af Amer 12/16/2014 59* >60 mL/min Final   Comment: (NOTE) The eGFR has been calculated using the CKD EPI equation. This calculation has not been validated in all clinical situations. eGFR's persistently <60 mL/min signify possible Chronic Kidney Disease.   . Anion gap 12/16/2014 12  5 - 15 Final  . Magnesium 12/16/2014 1.1* 1.7 - 2.4 mg/dL Final  . Blood Bank Specimen  12/16/2014 SAMPLE AVAILABLE FOR TESTING   Final  . 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  Hemoglobin is 7.7 we will recheck CBC tomorrow if hemoglobin drops to 7 g or below and patient is symptomatic we will transfuse 2 units of packed cells  Otherwise patient would be discharged tomorrow if there is no other new complaint by tomorrow morning.  INR is 2.09 so Coumadin 5 mg would be restarted  I will reevaluate patient next week with CBC, comprehensive metabolic panel, and pro time and decide on further line of treatment  He is better controlled with fentanyl patch and oxycodone for breakthrough pain   Primary cancer of endometrium   Staging form: Corpus Uteri - Carcinoma, AJCC 7th Edition     Clinical: Stage IVB (T1a, N0, M1) - Signed by Forest Gleason, MD on 05/24/2014     Pathologic: No stage assigned - Marni Griffon, MD   12/18/2014 4:30 PM

## 2014-12-18 NOTE — Plan of Care (Signed)
Problem: Bowel/Gastric: Goal: Will not experience complications related to bowel motility Outcome: Progressing No c/o pain nor nausea/vomiting. Continues on iv fluids. Up to the [facility] with one assist. Possible d/c home today. Continue to monitor.

## 2014-12-18 NOTE — Plan of Care (Signed)
Pt w/endometrial ca w/ mets to lymph nodes and pelvis and spine - No Nausea or vomiting today.  Pt didn't require any pain medication today but does have L leg pain when she puts weight on it. Has fentanyl patchand oxycodone for break through pain. Pt's Hgb dropped to 7.7 from 8.1.  If Hgb drops to 7 tomorrow, and pt is symptomatic, will transfuse 2u PRBC.   Pt remained safe throughout shift.  Calls for assistance to [facility]; and family is at bedside.

## 2014-12-19 ENCOUNTER — Ambulatory Visit: Payer: No Typology Code available for payment source

## 2014-12-19 LAB — CBC WITH DIFFERENTIAL/PLATELET
BASOS PCT: 0 %
Basophils Absolute: 0 10*3/uL (ref 0–0.1)
EOS PCT: 0 %
Eosinophils Absolute: 0 10*3/uL (ref 0–0.7)
HEMATOCRIT: 23.1 % — AB (ref 35.0–47.0)
Hemoglobin: 7.4 g/dL — ABNORMAL LOW (ref 12.0–16.0)
LYMPHS ABS: 1.2 10*3/uL (ref 1.0–3.6)
Lymphocytes Relative: 17 %
MCH: 29.3 pg (ref 26.0–34.0)
MCHC: 32.2 g/dL (ref 32.0–36.0)
MCV: 91 fL (ref 80.0–100.0)
MONO ABS: 0.4 10*3/uL (ref 0.2–0.9)
Monocytes Relative: 6 %
NEUTROS ABS: 5.5 10*3/uL (ref 1.4–6.5)
Neutrophils Relative %: 77 %
Platelets: 94 10*3/uL — ABNORMAL LOW (ref 150–440)
RBC: 2.53 MIL/uL — ABNORMAL LOW (ref 3.80–5.20)
RDW: 21.4 % — AB (ref 11.5–14.5)
WBC: 7.1 10*3/uL (ref 3.6–11.0)

## 2014-12-19 LAB — STOOL CULTURE

## 2014-12-19 MED ORDER — HEPARIN SOD (PORK) LOCK FLUSH 100 UNIT/ML IV SOLN
500.0000 [IU] | Freq: Once | INTRAVENOUS | Status: AC
  Administered 2014-12-19: 500 [IU] via INTRAVENOUS
  Filled 2014-12-19: qty 5

## 2014-12-19 MED ORDER — WARFARIN SODIUM 5 MG PO TABS: 5.0000 mg | ORAL_TABLET | Freq: Every day | ORAL | Status: DC

## 2014-12-19 NOTE — Progress Notes (Signed)
Patient discharged home per MD orders. All discharge instructions given and all questions answered. 

## 2014-12-19 NOTE — Progress Notes (Signed)
Occupational Therapy Treatment Patient Details Name: Emily Zamora MRN: [id number] DOB: 31-Dec-1955 Today's Date: 12/19/2014    History of present illness Patient is a 59 y/o female that presents with hypotension and generalized weakness. 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 She has recently had palliative radiation to L hip for pain control to increase mobility.    OT comments  Patient returning home today. She lives in a RV. Her parents live in a RV on the same property and sister lives in a house on same property. Technically she lives alone but family right next door.  Reviewed energy saving techniques with her and addressed safety issues. No further Occupational Therapy needed at this time.   Follow Up Recommendations  No OT follow up    Equipment Recommendations       Recommendations for Other Services      Precautions / Restrictions Precautions Precautions: Fall Restrictions Weight Bearing Restrictions: No       Mobility Bed Mobility                  Transfers                      Balance                                   ADL                                         General ADL Comments: Patient had been independent with basic ADL.      Vision                     Perception     Praxis      Cognition   Behavior During Therapy: WFL for tasks assessed/performed Overall Cognitive Status: Within Functional Limits for tasks assessed                       Extremity/Trunk Assessment               Exercises     Shoulder Instructions       General Comments      Pertinent Vitals/ Pain          Home Living                                          Prior Functioning/Environment              Frequency       Progress Toward Goals  OT Goals(current goals can now be found in the care plan section)     Acute  Rehab OT Goals Patient Stated Goal: To return home  Plan      Co-evaluation                 End of Session     Activity Tolerance     Patient Left in bed;with call bell/phone within reach;with bed alarm set   Nurse Communication          Time: [phone] OT Time Calculation (min): 12 min  Charges: OT General Charges $  OT Visit: 1 Procedure OT Treatments $Self Care/Home Management : 8-22 mins  Myrene Galas, MS/OTR/L  12/19/2014, 9:41 AM

## 2014-12-19 NOTE — Progress Notes (Signed)
[facility] Hematology/Oncology Progress Note  Date of admission: 12/16/2014  Hospital day:  12/19/2014  Chief Complaint: Emily Zamora is a 59 y.o. female with endometrial cancer who was admitted with hypotension, dehydration, low grade fever, and diarrhea.  Subjective:  Feeling "a lot better".  No new complaints.  Social History: The patient is accompanied by her mother today.  Allergies:  Allergies  Allergen Reactions  . Sulfa Antibiotics Anaphylaxis    Scheduled Medications: . atenolol  100 mg Oral Daily  . calcium-vitamin D  1 tablet Oral BID  . citalopram  40 mg Oral Daily  . famotidine  20 mg Oral BID  . feeding supplement  1 Container Oral TID WC  . fentaNYL  100 mcg Transdermal Q72H  . mirtazapine  15 mg Oral QHS  . warfarin  5 mg Oral q1800  . Warfarin - Physician Dosing Inpatient   Does not apply q1800    Review of Systems: GENERAL:  Feels "a lot better".  Energy level improved.  No sweats or weight loss. PERFORMANCE STATUS (ECOG):  2 HEENT:  No visual changes, runny nose, sore throat, mouth sores or tenderness. Lungs: No shortness of breath or cough.  No hemoptysis. Cardiac:  No chest pain, palpitations, orthopnea, or PND. GI:  No nausea, vomiting, diarrhea, constipation, melena or hematochezia. GU:  No urgency, frequency, dysuria, or hematuria. Musculoskeletal:  Back pain secondary to cancer.  No muscle tenderness. Extremities:  Left leg issues secondary to malignancy.  No lower extremity swelling. Skin:  No rashes or skin changes. Neuro:  No headache, numbness or weakness, balance or coordination issues. Endocrine:  No diabetes, thyroid issues, hot flashes or night sweats. Psych:  No mood changes, depression or anxiety. Pain:  No focal pain. Review of systems:  All other systems reviewed and found to be negative.  Physical Exam: Blood pressure 112/63, pulse 91, temperature 98.1 F (36.7 C), temperature source Oral, resp. rate 24,  height 5' 4"  (1.626 m), weight 157 lb 9.6 oz (71.487 kg), SpO2 98 %.  GENERAL:  Well developed, well nourished, sitting comfortably on the medical unit in no acute distress. MENTAL STATUS:  Alert and oriented to person, place and time. HEAD:  Wearing a cap.  Normocephalic, atraumatic, face symmetric, no Cushingoid features. EYES:  Glasses.  Blue eyes.  Pupils equal round and reactive to light and accomodation.  No conjunctivitis or scleral icterus. ENT:  Oropharynx clear without lesion.  Tongue normal. Mucous membranes moist.  RESPIRATORY:  Clear to auscultation without rales, wheezes or rhonchi. CARDIOVASCULAR:  Regular rate and rhythm without murmur, rub or gallop. ABDOMEN:  Soft, non-tender, with active bowel sounds, and no hepatosplenomegaly.  No masses. SKIN:  No rashes, ulcers or lesions. EXTREMITIES: No edema, no skin discoloration or tenderness.  No palpable cords. LYMPH NODES: No palpable cervical, supraclavicular, axillary or inguinal adenopathy  NEUROLOGICAL: Unremarkable. PSYCH:  Appropriate.  Results for orders placed or performed during the hospital encounter of 12/16/14 (from the past 48 hour(s))  C difficile quick scan w PCR reflex     Status: None   Collection Time: 12/17/14 11:15 AM  Result Value Ref Range   C Diff antigen NEGATIVE NEGATIVE   C Diff toxin NEGATIVE NEGATIVE   C Diff interpretation Negative for C. difficile   Stool culture     Status: None (Preliminary result)   Collection Time: 12/17/14 11:15 AM  Result Value Ref Range   Specimen Description STOOL    Special Requests Immunocompromised  Culture      NO SALMONELLA OR SHIGELLA ISOLATED No Pathogenic E. coli detected NO CAMPYLOBACTER DETECTED    Report Status PENDING   CBC with Differential     Status: Abnormal   Collection Time: 12/17/14  4:43 PM  Result Value Ref Range   WBC 11.0 3.6 - 11.0 K/uL   RBC 2.66 (L) 3.80 - 5.20 MIL/uL   Hemoglobin 7.7 (L) 12.0 - 16.0 g/dL   HCT 23.7 (L) 35.0 - 47.0 %    MCV 89.1 80.0 - 100.0 fL   MCH 29.1 26.0 - 34.0 pg   MCHC 32.7 32.0 - 36.0 g/dL   RDW 20.8 (H) 11.5 - 14.5 %   Platelets 88 (L) 150 - 440 K/uL   Neutrophils Relative % 84 %   Lymphocytes Relative 9 %   Monocytes Relative 7 %   Eosinophils Relative 0 %   Basophils Relative 0 %   Neutro Abs 9.2 (H) 1.4 - 6.5 K/uL   Lymphs Abs 1.0 1.0 - 3.6 K/uL   Monocytes Absolute 0.8 0.2 - 0.9 K/uL   Eosinophils Absolute 0.0 0 - 0.7 K/uL   Basophils Absolute 0.0 0 - 0.1 K/uL   RBC Morphology POLYCHROMASIA PRESENT     Comment: HYPOCHROMIC   WBC Morphology TOXIC GRANULATION   Protime-INR     Status: Abnormal   Collection Time: 12/18/14  4:35 AM  Result Value Ref Range   Prothrombin Time 23.3 (H) 11.4 - 15.0 seconds   INR 0.03   Basic metabolic panel     Status: Abnormal   Collection Time: 12/18/14  4:35 AM  Result Value Ref Range   Sodium 135 135 - 145 mmol/L   Potassium 3.1 (L) 3.5 - 5.1 mmol/L   Chloride 109 101 - 111 mmol/L   CO2 22 22 - 32 mmol/L   Glucose, Bld 81 65 - 99 mg/dL   BUN 8 6 - 20 mg/dL   Creatinine, Ser 0.76 0.44 - 1.00 mg/dL   Calcium 6.5 (L) 8.9 - 10.3 mg/dL   GFR calc non Af Amer >60 >60 mL/min   GFR calc Af Amer >60 >60 mL/min    Comment: (NOTE) The eGFR has been calculated using the CKD EPI equation. This calculation has not been validated in all clinical situations. eGFR's persistently <60 mL/min signify possible Chronic Kidney Disease.    Anion gap 4 (L) 5 - 15  CBC with Differential     Status: Abnormal   Collection Time: 12/19/14  6:14 AM  Result Value Ref Range   WBC 7.1 3.6 - 11.0 K/uL   RBC 2.53 (L) 3.80 - 5.20 MIL/uL   Hemoglobin 7.4 (L) 12.0 - 16.0 g/dL   HCT 23.1 (L) 35.0 - 47.0 %   MCV 91.0 80.0 - 100.0 fL   MCH 29.3 26.0 - 34.0 pg   MCHC 32.2 32.0 - 36.0 g/dL   RDW 21.4 (H) 11.5 - 14.5 %   Platelets 94 (L) 150 - 440 K/uL   Neutrophils Relative % 77 %   Lymphocytes Relative 17 %   Monocytes Relative 6 %   Eosinophils Relative 0 %   Basophils  Relative 0 %   Neutro Abs 5.5 1.4 - 6.5 K/uL   Lymphs Abs 1.2 1.0 - 3.6 K/uL   Monocytes Absolute 0.4 0.2 - 0.9 K/uL   Eosinophils Absolute 0.0 0 - 0.7 K/uL   Basophils Absolute 0.0 0 - 0.1 K/uL   RBC Morphology MIXED RBC POPULATION  WBC Morphology TOXIC GRANULATION    No results found.  Assessment:  WANNETTA LANGLAND is a 59 y.o. 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  On Coumadin for history of pulmonary emboli in 02/2014.  INR therapeutic (2.09).  Mild thrombocytopenia (94,000).  Anemia (hemoglobin 7.4).  Pain better controlled with Fentanyl patch and oxycodone prn.  Plan: 1. Hematology/Oncology:  Discussed hemoglobin with patient.  Currently asymptomatic and hemoglobin > 7.0.  No transfusion, but follow-up in clinic next week.  Add type and screen to outpatient labs.  No bleeding.        History of pulmonary embolism.  Discussed with patient, Coumadin dosing at 5 mg a day.  (Patient previuousy on 7.5 mg a day).    2. Disposition:  Discharge home today with follow-up with Dr. Oliva Bustard on 12/23/2014.   Lequita Asal, MD  12/19/2014, 9:39 AM

## 2014-12-19 NOTE — Care Management (Signed)
Discharge to home today per Dr. Mike Gip. Resumption of services thru [place] requested.  Floydene Flock, [facility] representative updated. Friend/family will transport.  Shelbie Ammons RN MSN CCM Care Management [phone]

## 2014-12-19 NOTE — Progress Notes (Signed)
Physician Discharge Summary  Patient ID: Emily Zamora MRN: [id number] DOB/AGE: November 02, 1955 59 y.o.  Admit date: 12/16/2014 Discharge date: 12/19/2014  Admission Diagnoses: Dehydration Diarrhea secondary to chemotherapy Stage IV carcinoma of endometrium  Discharge Diagnoses:  Active Problems:   Endometrial cancer, FIGO stage IVA (HCC)   Protein-calorie malnutrition, severe  dehydration secondary to diarrhea Nausea and vomiting Anemia multifactorial Discharged Condition: [place] Hospital Course: During hospital stay patient was started on IV antibiotic with Cipro and Flagyl.  Started on IV fluid and Imodium.  Stool for C. difficile was negative blood cultures were negative.  Gradually patient's condition improved.  Physiotherapy and occupational therapy was started patient started walking with the help of walker. Gradually diet was advanced.  Consults: physiotherapyt   and occupational therapy  Significant Diagnostic Studies: {Lab data  Treatments: IV fluid and IV antibiotics  Discharge Exam: Blood pressure 112/63, pulse 91, temperature 98.1 F (36.7 C), temperature source Oral, resp. rate 24, height 5\' 4"  (1.626 m), weight 157 lb 9.6 oz (71.487 kg), SpO2 98 %. Slightly pale looking Patient not in any acute distress.  Abdomen was soft.  Lower extremity trace edema.  Disposition:  discharged to home    Medication List    ASK your doctor about these medications        atenolol 100 MG tablet  Commonly known as:  TENORMIN  Take 1 tablet (100 mg total) by mouth daily.     citalopram 40 MG tablet  Commonly known as:  CELEXA  Take 1 tablet (40 mg total) by mouth daily.     cyclobenzaprine 5 MG tablet  Commonly known as:  FLEXERIL  Take 5 mg by mouth 3 (three) times daily as needed for muscle spasms.     dexamethasone 4 MG tablet  Commonly known as:  DECADRON  Take 8 mg by mouth 2 (two) times daily. Pt is to take the day before chemo(Taxotere).     famotidine 20 MG tablet   Commonly known as:  PEPCID  Take 1 tablet (20 mg total) by mouth 2 (two) times daily.     fentaNYL 25 MCG/HR patch  Commonly known as:  DURAGESIC - dosed mcg/hr  Place 25 mcg onto the skin every 3 (three) days. Pt uses with a 33mcg patch.     gabapentin 300 MG capsule  Commonly known as:  NEURONTIN  Take 1 capsule (300 mg total) by mouth 2 (two) times daily.     lidocaine-prilocaine cream  Commonly known as:  EMLA  Apply 1 application topically as needed.     loratadine 10 MG tablet  Commonly known as:  CLARITIN  Take 1 tablet (10 mg total) by mouth daily.     mirtazapine 15 MG tablet  Commonly known as:  REMERON  Take 15 mg by mouth at bedtime.     multivitamin with minerals Tabs tablet  Take 1 tablet by mouth daily.     ondansetron 4 MG tablet  Commonly known as:  ZOFRAN  Take 1 tablet (4 mg total) by mouth every 6 (six) hours as needed.     oxyCODONE 15 MG immediate release tablet  Commonly known as:  ROXICODONE  Take 2 tablets (30 mg total) by mouth every 2 (two) hours as needed for pain.     psyllium 58.6 % powder  Commonly known as:  METAMUCIL  Take 1 packet by mouth 3 (three) times daily as needed (for constipation).     sucralfate 1 G tablet  Commonly  known as:  CARAFATE  Take 1 tablet (1 g total) by mouth 4 (four) times daily -  with meals and at bedtime.     warfarin 5 MG tablet  Commonly known as:  COUMADIN  Take 1.5 tablets (7.5 mg total) by mouth daily at 6 PM.         Signed: Forest Gleason 12/19/2014, 8:14 AM

## 2014-12-19 NOTE — Plan of Care (Signed)
Problem: Bowel/Gastric: Goal: Will not experience complications related to bowel motility Outcome: Progressing Up to the [facility] with one assist. Oxycodone for pain with relief. No distress noted. Continues on IV fluids. Continue to monitor.

## 2014-12-21 ENCOUNTER — Other Ambulatory Visit: Payer: Self-pay | Admitting: Oncology

## 2014-12-21 LAB — CULTURE, BLOOD (ROUTINE X 2)
Culture: NO GROWTH
Culture: NO GROWTH

## 2014-12-22 ENCOUNTER — Other Ambulatory Visit: Payer: Self-pay | Admitting: Family Medicine

## 2014-12-22 ENCOUNTER — Ambulatory Visit: Payer: No Typology Code available for payment source

## 2014-12-22 ENCOUNTER — Other Ambulatory Visit: Payer: No Typology Code available for payment source

## 2014-12-23 ENCOUNTER — Ambulatory Visit: Payer: No Typology Code available for payment source

## 2014-12-23 ENCOUNTER — Inpatient Hospital Stay: Payer: No Typology Code available for payment source

## 2014-12-23 ENCOUNTER — Inpatient Hospital Stay (HOSPITAL_BASED_OUTPATIENT_CLINIC_OR_DEPARTMENT_OTHER): Payer: No Typology Code available for payment source | Admitting: Oncology

## 2014-12-23 ENCOUNTER — Encounter: Payer: Self-pay | Admitting: Oncology

## 2014-12-23 VITALS — BP 114/80 | HR 88 | Temp 98.3°F | Resp 18 | Wt 161.0 lb

## 2014-12-23 DIAGNOSIS — C541 Malignant neoplasm of endometrium: Secondary | ICD-10-CM

## 2014-12-23 DIAGNOSIS — Z170 Estrogen receptor positive status [ER+]: Secondary | ICD-10-CM

## 2014-12-23 DIAGNOSIS — Z5111 Encounter for antineoplastic chemotherapy: Secondary | ICD-10-CM | POA: Diagnosis present

## 2014-12-23 DIAGNOSIS — M545 Low back pain: Secondary | ICD-10-CM

## 2014-12-23 DIAGNOSIS — E860 Dehydration: Secondary | ICD-10-CM | POA: Diagnosis not present

## 2014-12-23 DIAGNOSIS — Z923 Personal history of irradiation: Secondary | ICD-10-CM | POA: Diagnosis not present

## 2014-12-23 DIAGNOSIS — K7460 Unspecified cirrhosis of liver: Secondary | ICD-10-CM

## 2014-12-23 DIAGNOSIS — I959 Hypotension, unspecified: Secondary | ICD-10-CM | POA: Diagnosis not present

## 2014-12-23 DIAGNOSIS — C7951 Secondary malignant neoplasm of bone: Secondary | ICD-10-CM

## 2014-12-23 DIAGNOSIS — Z86711 Personal history of pulmonary embolism: Secondary | ICD-10-CM | POA: Diagnosis not present

## 2014-12-23 DIAGNOSIS — M25561 Pain in right knee: Secondary | ICD-10-CM

## 2014-12-23 DIAGNOSIS — R5383 Other fatigue: Secondary | ICD-10-CM

## 2014-12-23 DIAGNOSIS — D709 Neutropenia, unspecified: Secondary | ICD-10-CM | POA: Diagnosis not present

## 2014-12-23 DIAGNOSIS — Z7689 Persons encountering health services in other specified circumstances: Secondary | ICD-10-CM | POA: Diagnosis not present

## 2014-12-23 DIAGNOSIS — R000 Tachycardia, unspecified: Secondary | ICD-10-CM | POA: Diagnosis not present

## 2014-12-23 DIAGNOSIS — Z7901 Long term (current) use of anticoagulants: Secondary | ICD-10-CM

## 2014-12-23 DIAGNOSIS — N289 Disorder of kidney and ureter, unspecified: Secondary | ICD-10-CM | POA: Diagnosis not present

## 2014-12-23 DIAGNOSIS — R531 Weakness: Secondary | ICD-10-CM | POA: Diagnosis not present

## 2014-12-23 DIAGNOSIS — R188 Other ascites: Secondary | ICD-10-CM | POA: Diagnosis not present

## 2014-12-23 DIAGNOSIS — R197 Diarrhea, unspecified: Secondary | ICD-10-CM | POA: Diagnosis not present

## 2014-12-23 DIAGNOSIS — Z79899 Other long term (current) drug therapy: Secondary | ICD-10-CM | POA: Diagnosis not present

## 2014-12-23 DIAGNOSIS — C779 Secondary and unspecified malignant neoplasm of lymph node, unspecified: Secondary | ICD-10-CM

## 2014-12-23 DIAGNOSIS — M25562 Pain in left knee: Secondary | ICD-10-CM

## 2014-12-23 DIAGNOSIS — M129 Arthropathy, unspecified: Secondary | ICD-10-CM | POA: Diagnosis not present

## 2014-12-23 DIAGNOSIS — I10 Essential (primary) hypertension: Secondary | ICD-10-CM

## 2014-12-23 DIAGNOSIS — C786 Secondary malignant neoplasm of retroperitoneum and peritoneum: Secondary | ICD-10-CM

## 2014-12-23 DIAGNOSIS — M25552 Pain in left hip: Secondary | ICD-10-CM

## 2014-12-23 DIAGNOSIS — Z8669 Personal history of other diseases of the nervous system and sense organs: Secondary | ICD-10-CM

## 2014-12-23 DIAGNOSIS — K219 Gastro-esophageal reflux disease without esophagitis: Secondary | ICD-10-CM

## 2014-12-23 LAB — PROTIME-INR
INR: 1.17
PROTHROMBIN TIME: 15.1 s — AB (ref 11.4–15.0)

## 2014-12-23 LAB — COMPREHENSIVE METABOLIC PANEL
ALK PHOS: 96 U/L (ref 38–126)
ALT: 11 U/L — AB (ref 14–54)
AST: 23 U/L (ref 15–41)
Albumin: 3.2 g/dL — ABNORMAL LOW (ref 3.5–5.0)
Anion gap: 8 (ref 5–15)
BUN: 14 mg/dL (ref 6–20)
CALCIUM: 7.9 mg/dL — AB (ref 8.9–10.3)
CO2: 27 mmol/L (ref 22–32)
CREATININE: 0.7 mg/dL (ref 0.44–1.00)
Chloride: 103 mmol/L (ref 101–111)
Glucose, Bld: 125 mg/dL — ABNORMAL HIGH (ref 65–99)
Potassium: 3.3 mmol/L — ABNORMAL LOW (ref 3.5–5.1)
Sodium: 138 mmol/L (ref 135–145)
Total Bilirubin: 0.3 mg/dL (ref 0.3–1.2)
Total Protein: 6 g/dL — ABNORMAL LOW (ref 6.5–8.1)

## 2014-12-23 LAB — CBC WITH DIFFERENTIAL/PLATELET
BASOS ABS: 0.1 10*3/uL (ref 0–0.1)
BASOS PCT: 1 %
EOS ABS: 0 10*3/uL (ref 0–0.7)
Eosinophils Relative: 0 %
HEMATOCRIT: 28.4 % — AB (ref 35.0–47.0)
HEMOGLOBIN: 9.4 g/dL — AB (ref 12.0–16.0)
Lymphocytes Relative: 12 %
Lymphs Abs: 0.7 10*3/uL — ABNORMAL LOW (ref 1.0–3.6)
MCH: 30.2 pg (ref 26.0–34.0)
MCHC: 33 g/dL (ref 32.0–36.0)
MCV: 91.4 fL (ref 80.0–100.0)
MONO ABS: 0.3 10*3/uL (ref 0.2–0.9)
MONOS PCT: 5 %
NEUTROS ABS: 4.7 10*3/uL (ref 1.4–6.5)
NEUTROS PCT: 82 %
Platelets: 237 10*3/uL (ref 150–440)
RBC: 3.11 MIL/uL — ABNORMAL LOW (ref 3.80–5.20)
RDW: 21.9 % — ABNORMAL HIGH (ref 11.5–14.5)
WBC: 5.7 10*3/uL (ref 3.6–11.0)

## 2014-12-23 LAB — SAMPLE TO BLOOD BANK

## 2014-12-23 LAB — MAGNESIUM: Magnesium: 1.3 mg/dL — ABNORMAL LOW (ref 1.7–2.4)

## 2014-12-23 MED ORDER — MAGNESIUM SULFATE 2 GM/50ML IV SOLN
2.0000 g | Freq: Once | INTRAVENOUS | Status: AC
  Administered 2014-12-23: 2 g via INTRAVENOUS
  Filled 2014-12-23: qty 50

## 2014-12-23 MED ORDER — FENTANYL 100 MCG/HR TD PT72: 100.0000 ug | MEDICATED_PATCH | TRANSDERMAL | Status: DC

## 2014-12-23 MED ORDER — SODIUM CHLORIDE 0.9 % IV SOLN
INTRAVENOUS | Status: DC
  Administered 2014-12-23: 15:00:00 via INTRAVENOUS
  Filled 2014-12-23: qty 1000

## 2014-12-23 MED ORDER — SODIUM CHLORIDE 0.9 % IJ SOLN
10.0000 mL | Freq: Once | INTRAMUSCULAR | Status: AC
  Administered 2014-12-23: 10 mL via INTRAVENOUS
  Filled 2014-12-23: qty 10

## 2014-12-23 MED ORDER — WARFARIN SODIUM 5 MG PO TABS: ORAL_TABLET | ORAL | Status: DC

## 2014-12-23 MED ORDER — HEPARIN SOD (PORK) LOCK FLUSH 100 UNIT/ML IV SOLN
500.0000 [IU] | Freq: Once | INTRAVENOUS | Status: AC
  Administered 2014-12-23: 500 [IU] via INTRAVENOUS
  Filled 2014-12-23: qty 5

## 2014-12-23 NOTE — Progress Notes (Signed)
[place] @ [facility] [phone]  Fax:([phone]) [place]: 05-Dec-1955  MR#: [id number]  XLK#:[id number]  Patient Care Team: Ashok Norris, MD as PCP - General (Family Medicine)  CHIEF COMPLAINT:  No chief complaint on file.   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 abdominal paracentesis x1 negative for malignant cells(22nd September)  2.PET scan shows extensive disease with lymph node metastases and bone metastases T1 N0 M1 disease biopsy is consistent with adenocarcinoma GYN or lesion.  Most likely from the endometrial cancer.  Estrogen receptor positive.  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 abdominal paracentesis x1 negative for malignant cells(22nd September)  2.PET scan shows extensive disease with lymph node metastases and bone metastases T1 N0 M1 disease biopsy is consistent with adenocarcinoma GYN or lesion.  Most likely from the endometrial cancer.  Estrogen receptor positive.  HER-2/neu receptor pending(October, 2015)  3.patient started on chemotherapy with carboplatinum and Taxolfrom November 06, 2013 4.acute pulmonary embolism February of 2016 on xeralto. 5.  Because of insurance not paying for [place] patient has been switched over to Coumadin (May, 2016) 6.  Now on maintenance a Avastin (April, 2016)   6.  Progressing disease by CT scan and MRI scan (October, 2016) 7.  His and has finished radiation therapy to the spine    Oncology Flowsheet 11/27/2014 11/28/2014 11/29/2014 12/08/2014 12/15/2014 12/16/2014 12/17/2014  Day, Cycle - - - Day 1, Cycle 2 - - -  bevacizumab (AVASTIN) IV - - - - - - -  CISplatin (PLATINOL) IV - - - 50 mg/m2 - - -  denosumab (XGEVA) [place] - - - - - - -  dexamethasone (DECADRON) IV - - - [ 12 mg ] - - -  diphenhydrAMINE (BENADRYL) PO - - - - - - -  DOCEtaxel (TAXOTERE) IV - - - 60 mg/m2 - - -  enoxaparin (LOVENOX) [place] - - - - - 40 mg 40 mg  fosaprepitant (EMEND) IV - - - [ 150 mg ] - - -  ondansetron (ZOFRAN) IV - - - - 8 mg 4 mg 4 mg  palonosetron (ALOXI) IV - - - 0.25 mg - - -  pegfilgrastim (NEULASTA ONPRO KIT) [place] - - - 6 mg - - -  Tbo-Filgrastim (GRANIX) [place] 480 mcg 480 mcg 480 mcg - - - -    INTERVAL HISTORY:  59 year old lady with stage IV endometrial cancer recently admitted in the hospital with neutropenia.  According to patient after radiation therapy patient's pain has not improved.  Constant dull aching pain on the left side in the femur and pelvis.  In has improved after patient was asked to increase fentanyl patch.   Patient is feeling weak and  tired Patient was sent to emergency room and I had a contact with emergency room physician last night.  IV fluid brought the blood pressure up and patient was discharged.  To be followed here today.  Patient continues to diarrhea to loose stool this morning.  Patient has stopped taking any stool softener and laxative did not take any Imodium.  Feeling extremely weak and tired. No chills or fever.  She  was recently hospitalized diarrhea, neutropenia and anemia. She is here for further follow-up and treatment consideration. Overall feeling much stronger.  Appetite is improving.  Pain is under control with pain medication with fentanyl patch and breakthrough pain medication.  Only times he has pain in the hip area  is when she tries to walk.  At present time she is walking with the help of cane REVIEW OF SYSTEMS:   Gen. Status: Patient was feeling extremely weak and tired.  No chills fever cold and clammy.  No fever Feeling still very weak but blood pressure is improved. GI: Diarrhea as described above.  No nausea or vomiting. Diarrhea continues patient has stopped taking any laxative but did not take any Imodium No rectal bleeding.  No abdominal pain.  GU no dysuria hematuria. Neurological system no headache no dizziness. GU: No dysuria hematuria musculoskeletal system no bony pain all other  12  systems have been reviewed  PAST MEDICAL HISTORY: Past Medical History  Diagnosis Date  . Cancer ([place])   . Primary cancer of endometrium ([place]) 05/24/2014  . Hypertension   . Knee pain, bilateral   . Arthritis   . Renal insufficiency     Significant History/PMH:   Stage 4 peritoneal Cancer:    Cirrhosis:    GERD:    endometerial cancer:    Arthritis:    Hypertension:    Migraines:    Hysterectomy:    Cholecystectomy:    Dilation and Curretage: with hysteroscopy, Aug 2012 FAMILY HISTORY Family History  Problem Relation Age of Onset  . Breast cancer Sister 29  . Hypertension Sister   . Diabetes Mother   . Hypertension Mother       ADVANCED DIRECTIVES:does have advanced healthcare directive   HEALTH MAINTENANCE: Social History  Substance Use Topics  . Smoking status: Never Smoker   . Smokeless tobacco: None  . Alcohol Use: No    :  Allergies  Allergen Reactions  . Sulfa Antibiotics Anaphylaxis    CBC Latest Ref Rng 12/23/2014 12/19/2014 12/17/2014  WBC 3.6 - 11.0 K/uL 5.7 7.1 11.0  Hemoglobin 12.0 - 16.0 g/dL 9.4(L) 7.4(L) 7.7(L)  Hematocrit 35.0 - 47.0 % 28.4(L) 23.1(L) 23.7(L)  Platelets 150 - 440 K/uL 237 94(L) 88(L)     OBJECTIVE:   ECOG FS:1 - Symptomatic but completely ambulatory  PHYSICAL EXAM:  general status: Patient is feeling weak and tired.  No  change in a performance status.  No chills.  No fever. HEENT: Alopecia.  No evidence of stomatitis Lungs: No cough or shortness of breath Cardiac: No chest pain or paroxysmal nocturnal dyspnea GI: No nausea no vomiting no diarrhea no abdominal pain Skin: No rash For skin turgor clinical signs of dehydration Lower extremity no swelling Neurological system: No tingling.  No numbness.  No other focal signs Musculoskeletal system : Increasing pain not able to bear weight on the l left lower extremity. Difficulty in ambulation Recent hospitalization with neutropenia and fever  NEUROLOGICAL: As per history of present illness weakness in the left lower extremity PSYCH:  Less  depressed LAB RESULTS:     Lab Results  Component Value Date   CA125 65.9* 12/08/2014   Lab Results  Component Value Date   CEA 129.5* 12/08/2014    Lab Results  Component Value Date   CA125 65.9* 12/08/2014    ASSESSMENT: Carcinoma of endometrium recurrent disease with pericorneal implants and ascites 2.all hospital records have been reviewed 3.  Diarrhea has improved 4.  Hemoglobin has improved to 9.4 without any transfusion 5.  Pro-time is subtherapeutic because the dose of Coumadin was decreased.  She does of Coumadin will be increased to 7.5 mg Monday Wednesday Friday remaining days 5 mg and recheck pro time in 2 weeks 6.  Pain is well controlled with fentanyl patch 100 g and breakthrough pain medication 7.  Regarding stage IV endometrial cancer no further therapy is being planned at present time.  Consider anti-hormonal therapy like Megace versus possibility of immunotherapy Reevaluation of patient in 2 weeks Hip pain in the left hip gets worse and radiation therapy would be recommended      Patient expressed understanding and was in agreement with this plan. She also understands that She can call clinic at any time with any questions, concerns, or complaints.    Primary cancer of endometrium    Staging form: Corpus Uteri - Carcinoma, AJCC 7th Edition     Clinical: Stage IVB (T1a, N0, M1) - Signed by Forest Gleason, MD on 05/24/2014     Pathologic: No stage assigned - Marni Griffon, MD   12/23/2014 1:43 PM

## 2014-12-23 NOTE — Progress Notes (Signed)
Patient states she is feeling much better.

## 2014-12-24 ENCOUNTER — Ambulatory Visit: Payer: No Typology Code available for payment source

## 2014-12-25 ENCOUNTER — Ambulatory Visit: Payer: No Typology Code available for payment source

## 2014-12-26 ENCOUNTER — Ambulatory Visit: Payer: No Typology Code available for payment source

## 2014-12-30 ENCOUNTER — Other Ambulatory Visit: Payer: No Typology Code available for payment source

## 2014-12-30 ENCOUNTER — Ambulatory Visit: Payer: No Typology Code available for payment source

## 2014-12-30 NOTE — Discharge Summary (Signed)
Forest Gleason, MD Physician Signed Oncology Progress Notes 12/19/2014 8:13 AM    Expand All Collapse All   Physician Discharge Summary  Patient ID: Emily Zamora MRN: [id number] DOB/AGE: 61/08/1955 59 y.o.  Admit date: 12/16/2014 Discharge date: 12/19/2014  Admission Diagnoses: Dehydration Diarrhea secondary to chemotherapy Stage IV carcinoma of endometrium  Discharge Diagnoses:  Active Problems:  Endometrial cancer, FIGO stage IVA (HCC)  Protein-calorie malnutrition, severe  dehydration secondary to diarrhea Nausea and vomiting Anemia multifactorial Discharged Condition: [place] Hospital Course: During hospital stay patient was started on IV antibiotic with Cipro and Flagyl. Started on IV fluid and Imodium. Stool for C. difficile was negative blood cultures were negative. Gradually patient's condition improved. Physiotherapy and occupational therapy was started patient started walking with the help of walker. Gradually diet was advanced.  Consults: physiotherapyt and occupational therapy  Significant Diagnostic Studies: {Lab data  Treatments: IV fluid and IV antibiotics  Discharge Exam: Blood pressure 112/63, pulse 91, temperature 98.1 F (36.7 C), temperature source Oral, resp. rate 24, height 5\' 4"  (1.626 m), weight 157 lb 9.6 oz (71.487 kg), SpO2 98 %. Slightly pale looking Patient not in any acute distress. Abdomen was soft. Lower extremity trace edema.  Disposition: discharged to home    Medication List    ASK your doctor about these medications       atenolol 100 MG tablet  Commonly known as: TENORMIN  Take 1 tablet (100 mg total) by mouth daily.     citalopram 40 MG tablet  Commonly known as: CELEXA  Take 1 tablet (40 mg total) by mouth daily.     cyclobenzaprine 5 MG tablet  Commonly known as: FLEXERIL  Take 5 mg by mouth 3 (three) times daily as needed for muscle spasms.     dexamethasone 4 MG tablet  Commonly  known as: DECADRON  Take 8 mg by mouth 2 (two) times daily. Pt is to take the day before chemo(Taxotere).     famotidine 20 MG tablet  Commonly known as: PEPCID  Take 1 tablet (20 mg total) by mouth 2 (two) times daily.     fentaNYL 25 MCG/HR patch  Commonly known as: DURAGESIC - dosed mcg/hr  Place 25 mcg onto the skin every 3 (three) days. Pt uses with a 71mcg patch.     gabapentin 300 MG capsule  Commonly known as: NEURONTIN  Take 1 capsule (300 mg total) by mouth 2 (two) times daily.     lidocaine-prilocaine cream  Commonly known as: EMLA  Apply 1 application topically as needed.     loratadine 10 MG tablet  Commonly known as: CLARITIN  Take 1 tablet (10 mg total) by mouth daily.     mirtazapine 15 MG tablet  Commonly known as: REMERON  Take 15 mg by mouth at bedtime.     multivitamin with minerals Tabs tablet  Take 1 tablet by mouth daily.     ondansetron 4 MG tablet  Commonly known as: ZOFRAN  Take 1 tablet (4 mg total) by mouth every 6 (six) hours as needed.     oxyCODONE 15 MG immediate release tablet  Commonly known as: ROXICODONE  Take 2 tablets (30 mg total) by mouth every 2 (two) hours as needed for pain.     psyllium 58.6 % powder  Commonly known as: METAMUCIL  Take 1 packet by mouth 3 (three) times daily as needed (for constipation).     sucralfate 1 G tablet  Commonly known as: CARAFATE  Take  1 tablet (1 g total) by mouth 4 (four) times daily - with meals and at bedtime.     warfarin 5 MG tablet  Commonly known as: COUMADIN  Take 1.5 tablets (7.5 mg total) by mouth daily at 6 PM.         Signed: Forest Gleason 12/19/2014, 8:14 AM

## 2014-12-31 ENCOUNTER — Ambulatory Visit: Payer: No Typology Code available for payment source

## 2015-01-06 ENCOUNTER — Inpatient Hospital Stay (HOSPITAL_BASED_OUTPATIENT_CLINIC_OR_DEPARTMENT_OTHER): Payer: BLUE CROSS/BLUE SHIELD | Admitting: Oncology

## 2015-01-06 ENCOUNTER — Ambulatory Visit
Admission: RE | Admit: 2015-01-06 | Discharge: 2015-01-06 | Disposition: A | Discharge status: Home / Self Care | Payer: BLUE CROSS/BLUE SHIELD | Source: Ambulatory Visit | Attending: Oncology | Admitting: Oncology

## 2015-01-06 ENCOUNTER — Ambulatory Visit: Payer: No Typology Code available for payment source

## 2015-01-06 ENCOUNTER — Ambulatory Visit: Payer: No Typology Code available for payment source | Admitting: Oncology

## 2015-01-06 ENCOUNTER — Encounter: Payer: Self-pay | Admitting: Oncology

## 2015-01-06 ENCOUNTER — Other Ambulatory Visit: Payer: No Typology Code available for payment source

## 2015-01-06 ENCOUNTER — Inpatient Hospital Stay: Payer: BLUE CROSS/BLUE SHIELD | Attending: Oncology

## 2015-01-06 VITALS — BP 94/70 | HR 93 | Temp 97.7°F | Wt 159.4 lb

## 2015-01-06 DIAGNOSIS — M25561 Pain in right knee: Secondary | ICD-10-CM | POA: Diagnosis not present

## 2015-01-06 DIAGNOSIS — C541 Malignant neoplasm of endometrium: Secondary | ICD-10-CM

## 2015-01-06 DIAGNOSIS — C786 Secondary malignant neoplasm of retroperitoneum and peritoneum: Secondary | ICD-10-CM | POA: Diagnosis not present

## 2015-01-06 DIAGNOSIS — M1990 Unspecified osteoarthritis, unspecified site: Secondary | ICD-10-CM | POA: Diagnosis not present

## 2015-01-06 DIAGNOSIS — M4854XS Collapsed vertebra, not elsewhere classified, thoracic region, sequela of fracture: Secondary | ICD-10-CM | POA: Diagnosis not present

## 2015-01-06 DIAGNOSIS — M4854XA Collapsed vertebra, not elsewhere classified, thoracic region, initial encounter for fracture: Secondary | ICD-10-CM | POA: Diagnosis not present

## 2015-01-06 DIAGNOSIS — R197 Diarrhea, unspecified: Secondary | ICD-10-CM | POA: Insufficient documentation

## 2015-01-06 DIAGNOSIS — Z5112 Encounter for antineoplastic immunotherapy: Secondary | ICD-10-CM | POA: Diagnosis not present

## 2015-01-06 DIAGNOSIS — C7951 Secondary malignant neoplasm of bone: Secondary | ICD-10-CM

## 2015-01-06 DIAGNOSIS — N289 Disorder of kidney and ureter, unspecified: Secondary | ICD-10-CM

## 2015-01-06 DIAGNOSIS — Z170 Estrogen receptor positive status [ER+]: Secondary | ICD-10-CM | POA: Insufficient documentation

## 2015-01-06 DIAGNOSIS — M25562 Pain in left knee: Secondary | ICD-10-CM | POA: Insufficient documentation

## 2015-01-06 DIAGNOSIS — M545 Low back pain: Secondary | ICD-10-CM

## 2015-01-06 DIAGNOSIS — M47814 Spondylosis without myelopathy or radiculopathy, thoracic region: Secondary | ICD-10-CM | POA: Insufficient documentation

## 2015-01-06 DIAGNOSIS — I10 Essential (primary) hypertension: Secondary | ICD-10-CM | POA: Diagnosis not present

## 2015-01-06 DIAGNOSIS — R300 Dysuria: Secondary | ICD-10-CM | POA: Diagnosis not present

## 2015-01-06 DIAGNOSIS — Z9221 Personal history of antineoplastic chemotherapy: Secondary | ICD-10-CM

## 2015-01-06 DIAGNOSIS — Z86711 Personal history of pulmonary embolism: Secondary | ICD-10-CM | POA: Diagnosis not present

## 2015-01-06 DIAGNOSIS — Z90722 Acquired absence of ovaries, bilateral: Secondary | ICD-10-CM | POA: Insufficient documentation

## 2015-01-06 DIAGNOSIS — Z9049 Acquired absence of other specified parts of digestive tract: Secondary | ICD-10-CM | POA: Diagnosis not present

## 2015-01-06 DIAGNOSIS — M549 Dorsalgia, unspecified: Secondary | ICD-10-CM | POA: Diagnosis not present

## 2015-01-06 DIAGNOSIS — K7460 Unspecified cirrhosis of liver: Secondary | ICD-10-CM

## 2015-01-06 DIAGNOSIS — R5383 Other fatigue: Secondary | ICD-10-CM | POA: Insufficient documentation

## 2015-01-06 DIAGNOSIS — M25552 Pain in left hip: Secondary | ICD-10-CM

## 2015-01-06 DIAGNOSIS — C779 Secondary and unspecified malignant neoplasm of lymph node, unspecified: Secondary | ICD-10-CM

## 2015-01-06 DIAGNOSIS — K219 Gastro-esophageal reflux disease without esophagitis: Secondary | ICD-10-CM | POA: Insufficient documentation

## 2015-01-06 DIAGNOSIS — Z8669 Personal history of other diseases of the nervous system and sense organs: Secondary | ICD-10-CM

## 2015-01-06 DIAGNOSIS — R531 Weakness: Secondary | ICD-10-CM | POA: Diagnosis not present

## 2015-01-06 DIAGNOSIS — Z90710 Acquired absence of both cervix and uterus: Secondary | ICD-10-CM | POA: Insufficient documentation

## 2015-01-06 DIAGNOSIS — Z803 Family history of malignant neoplasm of breast: Secondary | ICD-10-CM | POA: Insufficient documentation

## 2015-01-06 DIAGNOSIS — Z923 Personal history of irradiation: Secondary | ICD-10-CM | POA: Insufficient documentation

## 2015-01-06 LAB — CBC WITH DIFFERENTIAL/PLATELET
Basophils Absolute: 0.1 10*3/uL (ref 0–0.1)
Basophils Relative: 1 %
EOS PCT: 4 %
Eosinophils Absolute: 0.3 10*3/uL (ref 0–0.7)
HEMATOCRIT: 33.9 % — AB (ref 35.0–47.0)
Hemoglobin: 11 g/dL — ABNORMAL LOW (ref 12.0–16.0)
LYMPHS ABS: 1 10*3/uL (ref 1.0–3.6)
LYMPHS PCT: 15 %
MCH: 29.6 pg (ref 26.0–34.0)
MCHC: 32.4 g/dL (ref 32.0–36.0)
MCV: 91.2 fL (ref 80.0–100.0)
MONO ABS: 0.5 10*3/uL (ref 0.2–0.9)
MONOS PCT: 8 %
NEUTROS ABS: 4.8 10*3/uL (ref 1.4–6.5)
Neutrophils Relative %: 72 %
PLATELETS: 252 10*3/uL (ref 150–440)
RBC: 3.72 MIL/uL — ABNORMAL LOW (ref 3.80–5.20)
RDW: 20.9 % — AB (ref 11.5–14.5)
WBC: 6.7 10*3/uL (ref 3.6–11.0)

## 2015-01-06 LAB — COMPREHENSIVE METABOLIC PANEL
ALT: 19 U/L (ref 14–54)
ANION GAP: 5 (ref 5–15)
AST: 32 U/L (ref 15–41)
Albumin: 3.6 g/dL (ref 3.5–5.0)
Alkaline Phosphatase: 158 U/L — ABNORMAL HIGH (ref 38–126)
BILIRUBIN TOTAL: 0.5 mg/dL (ref 0.3–1.2)
BUN: 19 mg/dL (ref 6–20)
CHLORIDE: 102 mmol/L (ref 101–111)
CO2: 25 mmol/L (ref 22–32)
Calcium: 8.3 mg/dL — ABNORMAL LOW (ref 8.9–10.3)
Creatinine, Ser: 0.95 mg/dL (ref 0.44–1.00)
Glucose, Bld: 137 mg/dL — ABNORMAL HIGH (ref 65–99)
POTASSIUM: 4.1 mmol/L (ref 3.5–5.1)
Sodium: 132 mmol/L — ABNORMAL LOW (ref 135–145)
TOTAL PROTEIN: 6.9 g/dL (ref 6.5–8.1)

## 2015-01-06 LAB — PROTIME-INR
INR: 1.99
PROTHROMBIN TIME: 22.5 s — AB (ref 11.4–15.0)

## 2015-01-06 LAB — MAGNESIUM: MAGNESIUM: 1.7 mg/dL (ref 1.7–2.4)

## 2015-01-06 MED ORDER — OXYCODONE HCL 15 MG PO TABS: 30.0000 mg | ORAL_TABLET | ORAL | Status: AC | PRN

## 2015-01-06 NOTE — Progress Notes (Signed)
About a week ago patient was in the shower and her back popped.  Since that time she has had pain in her left back that radiates into her pelvis.

## 2015-01-06 NOTE — Progress Notes (Signed)
[place] @ [facility] Telephone:(336) [phone]  Fax:(336) [place]: Mar 13, 1955  MR#: [id number]  RFF#:[id number]  Patient Care Team: Ashok Norris, MD as PCP - General (Family Medicine)  CHIEF COMPLAINT:  Chief Complaint  Patient presents with  . 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 abdominal paracentesis x1 negative for malignant cells(22nd September)  2.PET scan shows extensive disease with lymph node metastases and bone metastases T1 N0 M1 disease biopsy is consistent with adenocarcinoma GYN or lesion.  Most likely from the endometrial cancer.  Estrogen receptor positive.  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 abdominal paracentesis x1 negative for malignant cells(22nd September)  2.PET scan shows extensive disease with lymph node metastases and bone metastases T1 N0 M1 disease biopsy is consistent with adenocarcinoma GYN or lesion.  Most likely from the endometrial cancer.  Estrogen receptor positive.  HER-2/neu receptor pending(October, 2015)  3.patient started on chemotherapy with carboplatinum and Taxolfrom November 06, 2013 4.acute pulmonary embolism February of 2016 on xeralto. 5.  Because of insurance not paying for [place] patient has been switched over to Coumadin (May, 2016) 6.  Now on  maintenance a Avastin (April, 2016)   6.  Progressing disease by CT scan and MRI scan (October, 2016) 7.  His and has finished radiation therapy to the spine    Oncology Flowsheet 11/27/2014 11/28/2014 11/29/2014 12/08/2014 12/15/2014 12/16/2014 12/17/2014  Day, Cycle - - - Day 1, Cycle 2 - - -  bevacizumab (AVASTIN) IV - - - - - - -  CISplatin (PLATINOL) IV - - - 50 mg/m2 - - -  denosumab (XGEVA) [place] - - - - - - -  dexamethasone (DECADRON) IV - - - [ 12 mg ] - - -  diphenhydrAMINE (BENADRYL) PO - - - - - - -  DOCEtaxel (TAXOTERE) IV - - - 60 mg/m2 - - -  enoxaparin (LOVENOX) [place] - - - - - 40 mg 40 mg  fosaprepitant (EMEND) IV - - - [ 150 mg ] - - -  ondansetron (ZOFRAN) IV - - - - 8 mg 4 mg 4 mg  palonosetron (ALOXI) IV - - - 0.25 mg - - -  pegfilgrastim (NEULASTA ONPRO KIT) [place] - - - 6 mg - - -  Tbo-Filgrastim (GRANIX) [place] 480 mcg 480 mcg 480 mcg - - - -    INTERVAL HISTORY:  60 year old lady with stage IV endometrial cancer recently admitted in the hospital with neutropenia.  According to patient after radiation therapy patient's pain has not improved.  Constant dull aching pain on the left side in the femur and pelvis.  In has improved after patient was asked to increase fentanyl patch.  Patient is feeling weak and tired Patient was sent to emergency room and I had a contact with emergency room physician last night.  IV fluid brought the blood pressure up and patient was discharged.  To be followed here today.  Patient continues to diarrhea to loose stool this morning.  Patient has stopped taking any stool softener and laxative did not take any Imodium.  Feeling extremely weak and tired. No chills or fever. New problem is back pain.  Patient heard something popping in mid back area and started hurting. Pain in the left hip area continues. Appetite remains stable patient is feeling stronger drinking enough fluid Patient has an approval for compassionate use of NIVOLULAMAB  REVIEW OF  SYSTEMS:   Gen. Status: Patient was feeling extremely weak and tired.  No chills fever cold and clammy.  No fever Feeling still very weak but blood pressure is improved. GI: Diarrhea as described above.  No nausea or vomiting. Diarrhea continues patient has stopped taking any laxative but did not take any Imodium No rectal bleeding.  No abdominal pain.  GU no dysuria hematuria. Neurological system no headache no dizziness. GU: No dysuria hematuria musculoskeletal system no bony pain all other  12  systems have been reviewed Increasing back pain as described above. PAST MEDICAL HISTORY: Past Medical History  Diagnosis Date  . Cancer ([place])   . Primary cancer of endometrium ([place]) 05/24/2014  . Hypertension   . Knee pain, bilateral   . Arthritis   . Renal insufficiency     Significant History/PMH:   Stage 4 peritoneal Cancer:    Cirrhosis:    GERD:    endometerial cancer:    Arthritis:    Hypertension:    Migraines:    Hysterectomy:    Cholecystectomy:    Dilation and Curretage: with hysteroscopy, Aug 2012 FAMILY HISTORY Family History  Problem Relation Age of Onset  . Breast cancer Sister 73  . Hypertension Sister   . Diabetes Mother   . Hypertension Mother       ADVANCED DIRECTIVES:does have advanced healthcare directive   HEALTH MAINTENANCE: Social History  Substance Use Topics  . Smoking status: Never Smoker   . Smokeless tobacco: None  . Alcohol Use: No    :  Allergies  Allergen Reactions  . Sulfa Antibiotics Anaphylaxis    CBC Latest Ref Rng 01/06/2015 12/23/2014 12/19/2014  WBC 3.6 - 11.0 K/uL 6.7 5.7 7.1  Hemoglobin 12.0 - 16.0 g/dL 11.0(L) 9.4(L) 7.4(L)  Hematocrit 35.0 - 47.0 % 33.9(L) 28.4(L) 23.1(L)  Platelets 150 - 440 K/uL 252 237 94(L)     OBJECTIVE:   ECOG FS:1 - Symptomatic but completely ambulatory  PHYSICAL EXAM:  general status: Patient is feeling weak and tired.  No change in a performance status.  No chills.  No  fever. HEENT: Alopecia.  No evidence of stomatitis Lungs: No cough or shortness of breath Cardiac: No chest pain or paroxysmal nocturnal dyspnea GI: No nausea no vomiting no diarrhea no abdominal pain Skin: No rash For skin turgor clinical signs of dehydration Lower extremity no swelling Neurological system: No tingling.  No numbness.  No other focal signs Musculoskeletal system : Increasing pain not able to bear weight on the l left lower extremity. Difficulty in ambulation Recent hospitalization with neutropenia and fever  NEUROLOGICAL: As per history of present illness weakness in the left lower extremity PSYCH:  Less depressed LAB RESULTS:  All lab data has been reviewed today.   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  New onset of back pain most likely related to the compression fracture of the spine will get thoracic spine x-ray today and reviewed with 3.  Will proceed with XGEVA and her NIVOLULAMAB.  Bradycardia a side effect of NIVOLULAMAB S been explained to the patient and informed consent to be obtained. Total duration of visit was 45 minutes.  50% or more time was spent in counseling patient and family regarding prognosis     Patient expressed understanding and was in agreement with this plan. She also understands that She can call clinic at any time with any questions, concerns, or complaints.    Primary cancer of endometrium   Staging form: Corpus Uteri - Carcinoma, AJCC 7th Edition     Clinical: Stage IVB (T1a, N0, M1) - Signed by Forest Gleason, MD on 05/24/2014     Pathologic: No stage assigned - Marni Griffon, MD   01/06/2015 9:29 AM

## 2015-01-07 ENCOUNTER — Inpatient Hospital Stay: Payer: BLUE CROSS/BLUE SHIELD | Admitting: Oncology

## 2015-01-07 ENCOUNTER — Inpatient Hospital Stay: Payer: BLUE CROSS/BLUE SHIELD

## 2015-01-09 ENCOUNTER — Inpatient Hospital Stay: Payer: BLUE CROSS/BLUE SHIELD

## 2015-01-13 ENCOUNTER — Inpatient Hospital Stay: Payer: BLUE CROSS/BLUE SHIELD

## 2015-01-13 VITALS — BP 102/64 | HR 93 | Temp 98.0°F | Resp 18

## 2015-01-13 DIAGNOSIS — C541 Malignant neoplasm of endometrium: Secondary | ICD-10-CM

## 2015-01-13 MED ORDER — SODIUM CHLORIDE 0.9 % IV SOLN
Freq: Once | INTRAVENOUS | Status: AC
  Administered 2015-01-13: 10:00:00 via INTRAVENOUS
  Filled 2015-01-13: qty 1000

## 2015-01-13 MED ORDER — SODIUM CHLORIDE 0.9 % IV SOLN
240.0000 mg | Freq: Once | INTRAVENOUS | Status: AC
  Administered 2015-01-13: 240 mg via INTRAVENOUS
  Filled 2015-01-13: qty 24

## 2015-01-13 MED ORDER — HEPARIN SOD (PORK) LOCK FLUSH 100 UNIT/ML IV SOLN
500.0000 [IU] | Freq: Once | INTRAVENOUS | Status: AC | PRN
  Administered 2015-01-13: 500 [IU]
  Filled 2015-01-13: qty 5

## 2015-01-13 MED ORDER — SODIUM CHLORIDE 0.9 % IJ SOLN
10.0000 mL | INTRAMUSCULAR | Status: DC | PRN
  Administered 2015-01-13: 10 mL
  Filled 2015-01-13: qty 10

## 2015-01-26 ENCOUNTER — Inpatient Hospital Stay: Payer: BLUE CROSS/BLUE SHIELD

## 2015-01-26 ENCOUNTER — Inpatient Hospital Stay (HOSPITAL_BASED_OUTPATIENT_CLINIC_OR_DEPARTMENT_OTHER): Payer: BLUE CROSS/BLUE SHIELD | Admitting: Oncology

## 2015-01-26 VITALS — BP 102/71 | HR 92 | Temp 99.0°F | Resp 18 | Wt 156.5 lb

## 2015-01-26 DIAGNOSIS — Z8669 Personal history of other diseases of the nervous system and sense organs: Secondary | ICD-10-CM

## 2015-01-26 DIAGNOSIS — R531 Weakness: Secondary | ICD-10-CM

## 2015-01-26 DIAGNOSIS — K7460 Unspecified cirrhosis of liver: Secondary | ICD-10-CM

## 2015-01-26 DIAGNOSIS — R300 Dysuria: Secondary | ICD-10-CM

## 2015-01-26 DIAGNOSIS — C541 Malignant neoplasm of endometrium: Secondary | ICD-10-CM | POA: Diagnosis not present

## 2015-01-26 DIAGNOSIS — R197 Diarrhea, unspecified: Secondary | ICD-10-CM

## 2015-01-26 DIAGNOSIS — Z90722 Acquired absence of ovaries, bilateral: Secondary | ICD-10-CM

## 2015-01-26 DIAGNOSIS — C7951 Secondary malignant neoplasm of bone: Secondary | ICD-10-CM | POA: Diagnosis not present

## 2015-01-26 DIAGNOSIS — K219 Gastro-esophageal reflux disease without esophagitis: Secondary | ICD-10-CM

## 2015-01-26 DIAGNOSIS — C786 Secondary malignant neoplasm of retroperitoneum and peritoneum: Secondary | ICD-10-CM

## 2015-01-26 DIAGNOSIS — C779 Secondary and unspecified malignant neoplasm of lymph node, unspecified: Secondary | ICD-10-CM

## 2015-01-26 DIAGNOSIS — Z923 Personal history of irradiation: Secondary | ICD-10-CM

## 2015-01-26 DIAGNOSIS — M549 Dorsalgia, unspecified: Secondary | ICD-10-CM

## 2015-01-26 DIAGNOSIS — Z803 Family history of malignant neoplasm of breast: Secondary | ICD-10-CM

## 2015-01-26 DIAGNOSIS — R5383 Other fatigue: Secondary | ICD-10-CM

## 2015-01-26 DIAGNOSIS — M4854XS Collapsed vertebra, not elsewhere classified, thoracic region, sequela of fracture: Secondary | ICD-10-CM

## 2015-01-26 DIAGNOSIS — M25561 Pain in right knee: Secondary | ICD-10-CM

## 2015-01-26 DIAGNOSIS — Z170 Estrogen receptor positive status [ER+]: Secondary | ICD-10-CM

## 2015-01-26 DIAGNOSIS — Z9049 Acquired absence of other specified parts of digestive tract: Secondary | ICD-10-CM

## 2015-01-26 DIAGNOSIS — Z9221 Personal history of antineoplastic chemotherapy: Secondary | ICD-10-CM

## 2015-01-26 DIAGNOSIS — M1990 Unspecified osteoarthritis, unspecified site: Secondary | ICD-10-CM

## 2015-01-26 DIAGNOSIS — N289 Disorder of kidney and ureter, unspecified: Secondary | ICD-10-CM

## 2015-01-26 DIAGNOSIS — I10 Essential (primary) hypertension: Secondary | ICD-10-CM

## 2015-01-26 DIAGNOSIS — Z86711 Personal history of pulmonary embolism: Secondary | ICD-10-CM

## 2015-01-26 DIAGNOSIS — M545 Low back pain: Secondary | ICD-10-CM

## 2015-01-26 DIAGNOSIS — Z90710 Acquired absence of both cervix and uterus: Secondary | ICD-10-CM

## 2015-01-26 DIAGNOSIS — M25552 Pain in left hip: Secondary | ICD-10-CM

## 2015-01-26 DIAGNOSIS — M25562 Pain in left knee: Secondary | ICD-10-CM

## 2015-01-26 LAB — CBC WITH DIFFERENTIAL/PLATELET
Basophils Absolute: 0 10*3/uL (ref 0–0.1)
Basophils Relative: 1 %
EOS ABS: 0.3 10*3/uL (ref 0–0.7)
Eosinophils Relative: 4 %
HCT: 31.3 % — ABNORMAL LOW (ref 35.0–47.0)
HEMOGLOBIN: 10.1 g/dL — AB (ref 12.0–16.0)
LYMPHS ABS: 0.9 10*3/uL — AB (ref 1.0–3.6)
LYMPHS PCT: 15 %
MCH: 28 pg (ref 26.0–34.0)
MCHC: 32.4 g/dL (ref 32.0–36.0)
MCV: 86.6 fL (ref 80.0–100.0)
Monocytes Absolute: 0.5 10*3/uL (ref 0.2–0.9)
Monocytes Relative: 8 %
NEUTROS ABS: 4.7 10*3/uL (ref 1.4–6.5)
NEUTROS PCT: 72 %
Platelets: 147 10*3/uL — ABNORMAL LOW (ref 150–440)
RBC: 3.62 MIL/uL — AB (ref 3.80–5.20)
RDW: 18.9 % — ABNORMAL HIGH (ref 11.5–14.5)
WBC: 6.5 10*3/uL (ref 3.6–11.0)

## 2015-01-26 LAB — URINALYSIS COMPLETE WITH MICROSCOPIC (ARMC ONLY)
BACTERIA UA: NONE SEEN
Bilirubin Urine: NEGATIVE
Glucose, UA: NEGATIVE mg/dL
Ketones, ur: NEGATIVE mg/dL
Nitrite: NEGATIVE
PH: 5 (ref 5.0–8.0)
PROTEIN: NEGATIVE mg/dL
Specific Gravity, Urine: 1.008 (ref 1.005–1.030)

## 2015-01-26 LAB — COMPREHENSIVE METABOLIC PANEL
ALK PHOS: 192 U/L — AB (ref 38–126)
ALT: 14 U/L (ref 14–54)
AST: 28 U/L (ref 15–41)
Albumin: 3.3 g/dL — ABNORMAL LOW (ref 3.5–5.0)
Anion gap: 9 (ref 5–15)
BUN: 20 mg/dL (ref 6–20)
CALCIUM: 8.3 mg/dL — AB (ref 8.9–10.3)
CO2: 24 mmol/L (ref 22–32)
CREATININE: 0.87 mg/dL (ref 0.44–1.00)
Chloride: 97 mmol/L — ABNORMAL LOW (ref 101–111)
GFR calc non Af Amer: >60 mL/min (ref >60)
GLUCOSE: 143 mg/dL — AB (ref 65–99)
Potassium: 3.9 mmol/L (ref 3.5–5.1)
SODIUM: 130 mmol/L — AB (ref 135–145)
Total Bilirubin: 0.7 mg/dL (ref 0.3–1.2)
Total Protein: 6.6 g/dL (ref 6.5–8.1)

## 2015-01-26 LAB — MAGNESIUM: Magnesium: 1.7 mg/dL (ref 1.7–2.4)

## 2015-01-26 LAB — PROTIME-INR
INR: 2.73
PROTHROMBIN TIME: 28.5 s — AB (ref 11.4–15.0)

## 2015-01-26 MED ORDER — SODIUM CHLORIDE 0.9 % IJ SOLN
10.0000 mL | Freq: Once | INTRAMUSCULAR | Status: AC
  Administered 2015-01-26: 10 mL via INTRAVENOUS
  Filled 2015-01-26: qty 10

## 2015-01-26 MED ORDER — SODIUM CHLORIDE 0.9 % IV SOLN
Freq: Once | INTRAVENOUS | Status: AC
  Administered 2015-01-26: 10:00:00 via INTRAVENOUS
  Filled 2015-01-26: qty 1000

## 2015-01-26 MED ORDER — SODIUM CHLORIDE 0.9 % IV SOLN
240.0000 mg | Freq: Once | INTRAVENOUS | Status: AC
  Administered 2015-01-26: 240 mg via INTRAVENOUS
  Filled 2015-01-26: qty 24

## 2015-01-26 MED ORDER — HEPARIN SOD (PORK) LOCK FLUSH 100 UNIT/ML IV SOLN
500.0000 [IU] | Freq: Once | INTRAVENOUS | Status: AC
  Administered 2015-01-26: 500 [IU] via INTRAVENOUS
  Filled 2015-01-26: qty 5

## 2015-01-26 MED ORDER — CIPROFLOXACIN HCL 250 MG PO TABS: 250.0000 mg | ORAL_TABLET | Freq: Two times a day (BID) | ORAL | Status: DC

## 2015-01-26 MED ORDER — FENTANYL 100 MCG/HR TD PT72: 100.0000 ug | MEDICATED_PATCH | TRANSDERMAL | Status: DC

## 2015-01-26 MED ORDER — FENTANYL 100 MCG/HR TD PT72: 100.0000 ug | MEDICATED_PATCH | TRANSDERMAL | Status: AC

## 2015-01-26 NOTE — Progress Notes (Signed)
Patient states she has a bladder infection that she felt started yesterday.  States today she is having spasms - pain 4/10.  Temp 99 today, temp usually subnormal for this patient.  Also requesting refill for Fentanyl 100 mcg. Patient would like MD to review Xray of her back that she had  2 weeks ago. Patient also states she has had more nausea since her last treatment.

## 2015-01-26 NOTE — Progress Notes (Signed)
[place] @ [facility] Telephone:(336) [phone]  Fax:(336) [place]: [birth year]  MR#: [id number]  QQI#:[id number]  Patient Care Team: Ashok Norris, MD as PCP - General (Family Medicine)  CHIEF COMPLAINT:  Chief Complaint  Patient presents with  . 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 abdominal paracentesis x1 negative for malignant cells(22nd September)  2.PET scan shows extensive disease with lymph node metastases and bone metastases T1 N0 M1 disease biopsy is consistent with adenocarcinoma GYN or lesion.  Most likely from the endometrial cancer.  Estrogen receptor positive.  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 abdominal paracentesis x1 negative for malignant cells(22nd September)  2.PET scan shows extensive disease with lymph node metastases and bone metastases T1 N0 M1 disease biopsy is consistent with adenocarcinoma GYN or lesion.  Most likely from the endometrial cancer.  Estrogen receptor positive.  HER-2/neu receptor pending(October, 2015)  3.patient started on chemotherapy with carboplatinum and Taxolfrom November 06, 2013 4.acute pulmonary embolism February of 2016 on xeralto. 5.  Because of insurance not paying for [place] patient has been switched over to Coumadin (May, 2016) 6.  Now on  maintenance a Avastin (April, 2016)   6.  Progressing disease by CT scan and MRI scan (October, 2016) 7.  His and has finished radiation therapy to the spine    Oncology Flowsheet 11/29/2014 12/08/2014 12/15/2014 12/16/2014 12/17/2014 01/13/2015 01/26/2015  Day, Cycle - Day 1, Cycle 2 - - - - -  bevacizumab (AVASTIN) IV - - - - - - -  CISplatin (PLATINOL) IV - 50 mg/m2 - - - - -  denosumab (XGEVA) [place] - - - - - - -  dexamethasone (DECADRON) IV - [ 12 mg ] - - - - -  diphenhydrAMINE (BENADRYL) PO - - - - - - -  DOCEtaxel (TAXOTERE) IV - 60 mg/m2 - - - - -  enoxaparin (LOVENOX) [place] - - - 40 mg 40 mg - -  fosaprepitant (EMEND) IV - [ 150 mg ] - - - - -  nivolumab (OPDIVO) IV - - - - - 240 mg 240 mg  ondansetron (ZOFRAN) IV - - 8 mg 4 mg 4 mg - -  palonosetron (ALOXI) IV - 0.25 mg - - - - -  pegfilgrastim (NEULASTA ONPRO KIT) [place] - 6 mg - - - - -  Tbo-Filgrastim (GRANIX) [place] 480 mcg - - - - - -    INTERVAL HISTORY:  60 year old lady with stage IV endometrial cancer recently admitted in the hospital with neutropenia.  According to patient after radiation therapy patient's pain has not improved.  Constant dull aching pain on the left side in the femur and pelvis.  It has improved after patient was asked to increase fentanyl patch.   Patient returns to clinic today for evaluation and consideration of nivolumab. She complains of worsening back pain and fatigue. She also has burning with urination that started yesterday and she thinks she has a UTI. She has no neurologic complaints. Denies fevers or chills. Denies weight loss. She has no chest pain or shortness of breath. Denies nausea, vomiting, constipation, or diarrhea. Patient offers no other specific complaints today.   REVIEW OF SYSTEMS:   Gen. Status: Patient was feeling weak and tired.  No chills or fever.  GI: No nausea, vomiting or diarrhea. No rectal bleeding.  No abdominal pain.   Neurological: Denies headache or dizziness. GU: Dysuria,  no hematuria Musculoskeletal system: Back pain. all other  12  systems have been reviewed  PAST MEDICAL HISTORY: Past Medical History  Diagnosis Date  . Cancer ([place])   . Primary cancer of endometrium ([place]) 05/24/2014  . Hypertension   . Knee pain, bilateral   . Arthritis   . Renal insufficiency     Significant History/PMH:   Stage 4 peritoneal Cancer:    Cirrhosis:    GERD:    endometerial cancer:    Arthritis:    Hypertension:    Migraines:    Hysterectomy:    Cholecystectomy:    Dilation and Curretage: with hysteroscopy, Aug 2012 FAMILY HISTORY Family History  Problem Relation Age of Onset  . Breast cancer Sister 34  . Hypertension Sister   . Diabetes Mother   . Hypertension Mother       ADVANCED DIRECTIVES: does have advanced healthcare directive   HEALTH MAINTENANCE: Social History  Substance Use Topics  . Smoking status: Never Smoker   . Smokeless tobacco: Not on file  . Alcohol Use: No    :  Allergies  Allergen Reactions  . Sulfa Antibiotics Anaphylaxis    CBC Latest Ref Rng 01/26/2015 01/06/2015 12/23/2014  WBC 3.6 - 11.0 K/uL 6.5 6.7 5.7  Hemoglobin 12.0 - 16.0 g/dL 10.1(L) 11.0(L) 9.4(L)  Hematocrit 35.0 - 47.0 % 31.3(L) 33.9(L) 28.4(L)  Platelets 150 - 440 K/uL 147(L) 252 237     OBJECTIVE:   ECOG FS:1 - Symptomatic but completely ambulatory  PHYSICAL EXAM: General status: Patient is feeling weak and tired.  No change in a performance status.  No chills.  No fever. HEENT: Alopecia.  No evidence of stomatitis Lungs: No cough or shortness of breath Cardiac: No chest pain or paroxysmal nocturnal dyspnea GI: No nausea no vomiting no diarrhea no abdominal pain Skin: No rash For skin turgor clinical signs of dehydration Lower extremity no swelling Neurological system: No tingling.  No numbness.  No other focal signs Musculoskeletal system : Increasing pain  Difficulty in ambulation   LAB RESULTS:  All lab data has been  reviewed today.   Lab Results  Component Value Date   CA125 65.9* 12/08/2014   Lab Results  Component Value Date   CEA 129.5* 12/08/2014    Lab Results  Component Value Date   CA125 65.9* 12/08/2014    ASSESSMENT: 1. Endometrial Cancer: Continue with treatment today. Return in 2 weeks with labs, evaluation and consideration of nivolumab.  2. Increased back pain: thoracic spine x-ray reviewed with patient. T11 compression fracture has progressed since prior study. Will send referral to ortho for consideration of kyphoplasty. Refill Fentanyl patch.  3. Dysuria: U/A today. Short course of Cipro sent to pharmacy today.    Patient expressed understanding and  was in agreement with this plan. She also understands that She can call clinic at any time with any questions, concerns, or complaints.    Primary cancer of endometrium   Staging form: Corpus Uteri - Carcinoma, AJCC 7th Edition     Clinical: Stage IVB (T1a, N0, M1) - Signed by Forest Gleason, MD on 05/24/2014     Pathologic: No stage assigned - Unsigned  Dr. Grayland Ormond was available for consultation and review of plan of care for this patient.  Mayra Reel, NP   01/26/2015 1:32 PM   Patient was seen and evaluated independently and I agree with the assessment and plan as dictated above.  Lloyd Huger, MD 01/28/2015 6:13 AM

## 2015-02-03 ENCOUNTER — Other Ambulatory Visit: Payer: Self-pay | Admitting: Orthopedic Surgery

## 2015-02-03 DIAGNOSIS — S22080A Wedge compression fracture of T11-T12 vertebra, initial encounter for closed fracture: Secondary | ICD-10-CM

## 2015-02-04 ENCOUNTER — Ambulatory Visit
Admission: RE | Admit: 2015-02-04 | Discharge: 2015-02-04 | Disposition: A | Discharge status: Home / Self Care | Payer: BLUE CROSS/BLUE SHIELD | Source: Ambulatory Visit | Attending: Orthopedic Surgery | Admitting: Orthopedic Surgery

## 2015-02-04 ENCOUNTER — Other Ambulatory Visit: Payer: Self-pay | Admitting: Orthopedic Surgery

## 2015-02-04 DIAGNOSIS — R938 Abnormal findings on diagnostic imaging of other specified body structures: Secondary | ICD-10-CM | POA: Insufficient documentation

## 2015-02-04 DIAGNOSIS — S22080A Wedge compression fracture of T11-T12 vertebra, initial encounter for closed fracture: Secondary | ICD-10-CM | POA: Insufficient documentation

## 2015-02-04 DIAGNOSIS — R590 Localized enlarged lymph nodes: Secondary | ICD-10-CM | POA: Insufficient documentation

## 2015-02-04 MED ORDER — GADOBENATE DIMEGLUMINE 529 MG/ML IV SOLN
15.0000 mL | Freq: Once | INTRAVENOUS | Status: AC | PRN
  Administered 2015-02-04: 14 mL via INTRAVENOUS

## 2015-02-09 ENCOUNTER — Inpatient Hospital Stay (HOSPITAL_BASED_OUTPATIENT_CLINIC_OR_DEPARTMENT_OTHER): Payer: BLUE CROSS/BLUE SHIELD | Admitting: Oncology

## 2015-02-09 ENCOUNTER — Other Ambulatory Visit: Payer: Self-pay | Admitting: *Deleted

## 2015-02-09 ENCOUNTER — Inpatient Hospital Stay: Payer: BLUE CROSS/BLUE SHIELD | Attending: Oncology

## 2015-02-09 ENCOUNTER — Inpatient Hospital Stay: Payer: BLUE CROSS/BLUE SHIELD

## 2015-02-09 ENCOUNTER — Encounter: Payer: Self-pay | Admitting: Oncology

## 2015-02-09 VITALS — BP 92/66 | HR 102 | Temp 97.5°F | Resp 18 | Wt 151.0 lb

## 2015-02-09 DIAGNOSIS — M25552 Pain in left hip: Secondary | ICD-10-CM | POA: Diagnosis not present

## 2015-02-09 DIAGNOSIS — Z86711 Personal history of pulmonary embolism: Secondary | ICD-10-CM | POA: Diagnosis not present

## 2015-02-09 DIAGNOSIS — Z5112 Encounter for antineoplastic immunotherapy: Secondary | ICD-10-CM | POA: Diagnosis not present

## 2015-02-09 DIAGNOSIS — Z170 Estrogen receptor positive status [ER+]: Secondary | ICD-10-CM | POA: Insufficient documentation

## 2015-02-09 DIAGNOSIS — R42 Dizziness and giddiness: Secondary | ICD-10-CM | POA: Diagnosis not present

## 2015-02-09 DIAGNOSIS — R531 Weakness: Secondary | ICD-10-CM | POA: Insufficient documentation

## 2015-02-09 DIAGNOSIS — I959 Hypotension, unspecified: Secondary | ICD-10-CM

## 2015-02-09 DIAGNOSIS — Z9221 Personal history of antineoplastic chemotherapy: Secondary | ICD-10-CM | POA: Diagnosis not present

## 2015-02-09 DIAGNOSIS — C779 Secondary and unspecified malignant neoplasm of lymph node, unspecified: Secondary | ICD-10-CM | POA: Insufficient documentation

## 2015-02-09 DIAGNOSIS — R197 Diarrhea, unspecified: Secondary | ICD-10-CM | POA: Diagnosis not present

## 2015-02-09 DIAGNOSIS — Z923 Personal history of irradiation: Secondary | ICD-10-CM | POA: Diagnosis not present

## 2015-02-09 DIAGNOSIS — C7951 Secondary malignant neoplasm of bone: Secondary | ICD-10-CM | POA: Insufficient documentation

## 2015-02-09 DIAGNOSIS — N289 Disorder of kidney and ureter, unspecified: Secondary | ICD-10-CM | POA: Insufficient documentation

## 2015-02-09 DIAGNOSIS — M129 Arthropathy, unspecified: Secondary | ICD-10-CM

## 2015-02-09 DIAGNOSIS — C786 Secondary malignant neoplasm of retroperitoneum and peritoneum: Secondary | ICD-10-CM | POA: Diagnosis not present

## 2015-02-09 DIAGNOSIS — D649 Anemia, unspecified: Secondary | ICD-10-CM | POA: Diagnosis not present

## 2015-02-09 DIAGNOSIS — K7460 Unspecified cirrhosis of liver: Secondary | ICD-10-CM | POA: Diagnosis not present

## 2015-02-09 DIAGNOSIS — M549 Dorsalgia, unspecified: Secondary | ICD-10-CM | POA: Diagnosis not present

## 2015-02-09 DIAGNOSIS — R110 Nausea: Secondary | ICD-10-CM

## 2015-02-09 DIAGNOSIS — K219 Gastro-esophageal reflux disease without esophagitis: Secondary | ICD-10-CM

## 2015-02-09 DIAGNOSIS — M545 Low back pain: Secondary | ICD-10-CM

## 2015-02-09 DIAGNOSIS — C541 Malignant neoplasm of endometrium: Secondary | ICD-10-CM

## 2015-02-09 DIAGNOSIS — R5383 Other fatigue: Secondary | ICD-10-CM

## 2015-02-09 DIAGNOSIS — Z803 Family history of malignant neoplasm of breast: Secondary | ICD-10-CM | POA: Insufficient documentation

## 2015-02-09 LAB — COMPREHENSIVE METABOLIC PANEL
ALT: 19 U/L (ref 14–54)
AST: 43 U/L — AB (ref 15–41)
Albumin: 3.3 g/dL — ABNORMAL LOW (ref 3.5–5.0)
Alkaline Phosphatase: 264 U/L — ABNORMAL HIGH (ref 38–126)
Anion gap: 9 (ref 5–15)
BILIRUBIN TOTAL: 1 mg/dL (ref 0.3–1.2)
BUN: 24 mg/dL — AB (ref 6–20)
CHLORIDE: 98 mmol/L — AB (ref 101–111)
CO2: 21 mmol/L — ABNORMAL LOW (ref 22–32)
CREATININE: 0.87 mg/dL (ref 0.44–1.00)
Calcium: 8.4 mg/dL — ABNORMAL LOW (ref 8.9–10.3)
GFR calc Af Amer: >60 mL/min (ref >60)
Glucose, Bld: 152 mg/dL — ABNORMAL HIGH (ref 65–99)
Potassium: 4.1 mmol/L (ref 3.5–5.1)
Sodium: 128 mmol/L — ABNORMAL LOW (ref 135–145)
TOTAL PROTEIN: 6.7 g/dL (ref 6.5–8.1)

## 2015-02-09 LAB — CBC WITH DIFFERENTIAL/PLATELET
BASOS ABS: 0 10*3/uL (ref 0–0.1)
Basophils Relative: 1 %
Eosinophils Absolute: 0.2 10*3/uL (ref 0–0.7)
Eosinophils Relative: 3 %
HEMATOCRIT: 31.1 % — AB (ref 35.0–47.0)
Hemoglobin: 10.1 g/dL — ABNORMAL LOW (ref 12.0–16.0)
LYMPHS PCT: 20 %
Lymphs Abs: 1.2 10*3/uL (ref 1.0–3.6)
MCH: 27.3 pg (ref 26.0–34.0)
MCHC: 32.6 g/dL (ref 32.0–36.0)
MCV: 83.6 fL (ref 80.0–100.0)
Monocytes Absolute: 0.5 10*3/uL (ref 0.2–0.9)
Monocytes Relative: 9 %
NEUTROS ABS: 4.3 10*3/uL (ref 1.4–6.5)
NEUTROS PCT: 67 %
PLATELETS: 141 10*3/uL — AB (ref 150–440)
RBC: 3.72 MIL/uL — AB (ref 3.80–5.20)
RDW: 19.2 % — ABNORMAL HIGH (ref 11.5–14.5)
WBC: 6.3 10*3/uL (ref 3.6–11.0)

## 2015-02-09 MED ORDER — NIVOLUMAB CHEMO INJECTION 100 MG/10ML
240.0000 mg | Freq: Once | INTRAVENOUS | Status: AC
  Administered 2015-02-09: 240 mg via INTRAVENOUS
  Filled 2015-02-09: qty 24

## 2015-02-09 MED ORDER — CITALOPRAM HYDROBROMIDE 40 MG PO TABS: 40.0000 mg | ORAL_TABLET | Freq: Every day | ORAL | Status: DC

## 2015-02-09 MED ORDER — SODIUM CHLORIDE 0.9% FLUSH
10.0000 mL | Freq: Once | INTRAVENOUS | Status: AC
  Administered 2015-02-09: 10 mL via INTRAVENOUS
  Filled 2015-02-09: qty 10

## 2015-02-09 MED ORDER — HEPARIN SOD (PORK) LOCK FLUSH 100 UNIT/ML IV SOLN
500.0000 [IU] | Freq: Once | INTRAVENOUS | Status: AC
  Administered 2015-02-09: 500 [IU] via INTRAVENOUS
  Filled 2015-02-09: qty 5

## 2015-02-09 MED ORDER — DENOSUMAB 120 MG/1.7ML ~~LOC~~ SOLN
120.0000 mg | Freq: Once | SUBCUTANEOUS | Status: AC
  Administered 2015-02-09: 120 mg via SUBCUTANEOUS
  Filled 2015-02-09: qty 1.7

## 2015-02-09 MED ORDER — MIRTAZAPINE 15 MG PO TABS: 15.0000 mg | ORAL_TABLET | Freq: Every day | ORAL | Status: AC

## 2015-02-09 MED ORDER — ONDANSETRON HCL 4 MG PO TABS: 4.0000 mg | ORAL_TABLET | Freq: Four times a day (QID) | ORAL | Status: DC | PRN

## 2015-02-09 MED ORDER — CYCLOBENZAPRINE HCL 5 MG PO TABS
5.0000 mg | ORAL_TABLET | Freq: Three times a day (TID) | ORAL | Status: DC | PRN
Start: 2015-02-09 — End: 2015-02-19

## 2015-02-09 MED ORDER — SODIUM CHLORIDE 0.9 % IV SOLN
Freq: Once | INTRAVENOUS | Status: AC
  Administered 2015-02-09: 15:00:00 via INTRAVENOUS
  Filled 2015-02-09: qty 1000

## 2015-02-09 NOTE — Progress Notes (Signed)
Patient states a few days before her MRI she moved and felt something pop in her back.  She is here today for her MRI results.  She did not bounce back so well from her last treatment.  She has been nauseated.  States she has noticed an enlarged lymph node in her left neck.  Also requesting refill for Ondansetron, Celexa, Remeron and Flexaril.  Further states for past week she has run a temp at night with the highest being 103.  Also having night sweats.

## 2015-02-09 NOTE — Progress Notes (Signed)
[place] @ [facility] Telephone:(336) [phone]  Fax:(336) [place]: 10/17/1955  MR#: [id number]  IHW#:[id number]  Patient Care Team: Ashok Norris, MD as PCP - General (Family Medicine)  CHIEF COMPLAINT:  Chief Complaint  Patient presents with  . 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 abdominal paracentesis x1 negative for malignant cells(22nd September)  2.PET scan shows extensive disease with lymph node metastases and bone metastases T1 N0 M1 disease biopsy is consistent with adenocarcinoma GYN or lesion.  Most likely from the endometrial cancer.  Estrogen receptor positive.  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 abdominal paracentesis x1 negative for malignant cells(22nd September)  2.PET scan shows extensive disease with lymph node metastases and bone metastases T1 N0 M1 disease biopsy is consistent with adenocarcinoma GYN or lesion.  Most likely from the endometrial cancer.  Estrogen receptor positive.  HER-2/neu receptor pending(October, 2015)  3.patient started on chemotherapy with carboplatinum and Taxolfrom November 06, 2013 4.acute pulmonary embolism February of 2016 on xeralto. 5.  Because of insurance not paying for [place] patient has been switched over to Coumadin (May, 2016) 6.  Now on  maintenance a Avastin (April, 2016)   6.  Progressing disease by CT scan and MRI scan (October, 2016) 7. has finished radiation therapy to the spine      INTERVAL HISTORY:  60 year old lady with stage IV endometrial cancer recently admitted in the hospital with neutropenia.  According to patient after radiation therapy patient's pain has not improved.  Constant dull aching pain on the left side in the femur and pelvis.  In has improved after patient was asked to increase fentanyl patch.   Patient is feeling weak and tired Patient was sent to emergency room and I had a contact with emergency room physician last night.  IV fluid brought the blood pressure up and patient was discharged.  To be followed here today.  Patient continues to diarrhea to loose stool this morning.  Patient has stopped taking any stool softener and laxative did not take any Imodium.  Feeling extremely weak and tired. No chills or fever. New problem is back pain.  Patient heard something popping in mid back area and started hurting. Pain in the left hip area continues. Appetite remains stable patient is feeling stronger drinking enough fluid Patient states a few days before her MRI she moved and felt something pop in her back. She is here today for her MRI results. She did not bounce back so well from her last treatment. She has been nauseated. States she has noticed an enlarged lymph node in her left neck. Also requesting refill for Ondansetron, Celexa, Remeron and Flexaril. Further states for past week  she has run a temp at night with the highest being 103. Also having night sweats.  PATIENT IS HERE ALSO FOR CONTINUATION OF nivolulamab CHEMOTHERAPY Appetite has been stable.  Pain has improved.  Patient had MRI scan of thoracic spine which has been reviewed independently.  Compression fracture at T12 but bony pains are improving.  No nausea no vomiting no diarrhea No rash. Particularly when patient gets up   She feels  dizzy. slightly hypotensive.  Patient is taking metoprolol REVIEW OF SYSTEMS:   Gen. Status: Patient was feeling extremely weak and tired.  No chills fever cold and clammy.  No fever Feeling still very weak but blood pressure is improved. GI: Diarrhea as described above.  No nausea or vomiting. Diarrhea continues patient has stopped taking any laxative but did not take any Imodium No rectal bleeding.  No abdominal pain.  GU no dysuria hematuria. Neurological system no headache no dizziness. GU: No dysuria hematuria musculoskeletal system no bony pain all other  12  systems have been reviewed Increasing back pain as described above.  Is it has been patient gets up.  Patient had MRI scan of spine.  Back pain is improved. PAST MEDICAL HISTORY: Past Medical History  Diagnosis Date  . Cancer ([place])   . Primary cancer of endometrium ([place]) 05/24/2014  . Hypertension   . Knee pain, bilateral   . Arthritis   . Renal insufficiency     Significant History/PMH:   Stage 4 peritoneal Cancer:    Cirrhosis:    GERD:    endometerial cancer:    Arthritis:    Hypertension:    Migraines:    Hysterectomy:    Cholecystectomy:    Dilation and Curretage: with hysteroscopy, Aug 2012 FAMILY HISTORY Family History  Problem Relation Age of Onset  . Breast cancer Sister 2  . Hypertension Sister   . Diabetes Mother   . Hypertension Mother       ADVANCED DIRECTIVES:does have advanced healthcare directive   HEALTH MAINTENANCE: Social History  Substance Use Topics  . Smoking status: Never Smoker   . Smokeless tobacco: None  . Alcohol Use: No    :  Allergies  Allergen Reactions  . Sulfa Antibiotics Anaphylaxis    CBC Latest Ref Rng 02/09/2015 01/26/2015 01/06/2015  WBC 3.6 - 11.0 K/uL 6.3 6.5 6.7  Hemoglobin 12.0 - 16.0 g/dL 10.1(L) 10.1(L) 11.0(L)  Hematocrit 35.0 - 47.0 % 31.1(L) 31.3(L) 33.9(L)  Platelets 150 - 440 K/uL 141(L) 147(L) 252     OBJECTIVE:   ECOG FS:1 -  Symptomatic but completely ambulatory  PHYSICAL EXAM:  general status: Patient is feeling weak and tired.  No change in a performance status.  No chills.  No fever. HEENT: Alopecia.  No evidence of stomatitis Lungs: No cough or shortness of breath Cardiac: No chest pain or paroxysmal nocturnal dyspnea GI: No nausea no vomiting no diarrhea no abdominal pain Skin: No rash For skin turgor clinical signs of dehydration Lower extremity no swelling Neurological system: No tingling.  No numbness.  No other focal signs Musculoskeletal system : Increasing pain not able to bear weight on the l left lower extremity. Difficulty in ambulation Recent hospitalization with neutropenia and fever  NEUROLOGICAL: As per history of present illness weakness in the left lower extremity PSYCH:  Less depressed LAB RESULTS:  All lab data has been reviewed today.   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  New onset of back pain most likely related to the compression fracture of the spine will get thoracic spine x-ray today and reviewed with 3.  Will proceed with XGEVA and her NIVOLULAMAB.  Bradycardia a side effect of NIVOLULAMAB  Anemia is stable MRI scan has been reviewed T12 spine shows compression fracture patient had a radiation therapy there before if needed and if pain continues possibility of kyphoplasty can be considered  2.  Hypotension  and dizziness.  Patient was advised to decrease metoprolol to 50 mg daily. Patient and family also wanted to know her prognosis in the overall course of the disease which has been discussed    Patient expressed understanding and was in agreement with this plan. She also  understands that She can call clinic at any time with any questions, concerns, or complaints.    Primary cancer of endometrium   Staging form: Corpus Uteri - Carcinoma, AJCC 7th Edition     Clinical: Stage IVB (T1a, N0, M1) - Signed by Forest Gleason, MD on 05/24/2014     Pathologic: No stage assigned - Marni Griffon, MD   02/09/2015 2:49 PM

## 2015-02-17 ENCOUNTER — Inpatient Hospital Stay (HOSPITAL_COMMUNITY): Payer: BLUE CROSS/BLUE SHIELD | Admitting: Certified Registered Nurse Anesthetist

## 2015-02-17 ENCOUNTER — Encounter (HOSPITAL_COMMUNITY)
Admission: EM | Disposition: A | Discharge status: Hospice | Payer: Self-pay | Source: Other Acute Inpatient Hospital | Attending: Neurosurgery

## 2015-02-17 ENCOUNTER — Emergency Department: Payer: BLUE CROSS/BLUE SHIELD

## 2015-02-17 ENCOUNTER — Emergency Department
Admission: EM | Admit: 2015-02-17 | Discharge: 2015-02-17 | Disposition: A | Discharge status: Other Acute Inpatient Hospital | Payer: BLUE CROSS/BLUE SHIELD | Attending: Emergency Medicine | Admitting: Emergency Medicine

## 2015-02-17 ENCOUNTER — Encounter (HOSPITAL_COMMUNITY): Payer: Self-pay | Admitting: Anesthesiology

## 2015-02-17 ENCOUNTER — Encounter
Admission: EM | Disposition: A | Discharge status: Other Acute Inpatient Hospital | Payer: Self-pay | Source: Home / Self Care | Attending: Emergency Medicine

## 2015-02-17 ENCOUNTER — Inpatient Hospital Stay (HOSPITAL_COMMUNITY)
Admission: EM | Admit: 2015-02-17 | Discharge: 2015-02-19 | DRG: 026 | Disposition: A | Discharge status: Hospice | Payer: BLUE CROSS/BLUE SHIELD | Source: Other Acute Inpatient Hospital | Attending: Neurosurgery | Admitting: Neurosurgery

## 2015-02-17 ENCOUNTER — Encounter: Payer: Self-pay | Admitting: Emergency Medicine

## 2015-02-17 DIAGNOSIS — Z789 Other specified health status: Secondary | ICD-10-CM | POA: Diagnosis not present

## 2015-02-17 DIAGNOSIS — Z90710 Acquired absence of both cervix and uterus: Secondary | ICD-10-CM | POA: Diagnosis not present

## 2015-02-17 DIAGNOSIS — I6201 Nontraumatic acute subdural hemorrhage: Principal | ICD-10-CM | POA: Diagnosis present

## 2015-02-17 DIAGNOSIS — C541 Malignant neoplasm of endometrium: Secondary | ICD-10-CM | POA: Diagnosis present

## 2015-02-17 DIAGNOSIS — R791 Abnormal coagulation profile: Secondary | ICD-10-CM

## 2015-02-17 DIAGNOSIS — T45515A Adverse effect of anticoagulants, initial encounter: Secondary | ICD-10-CM | POA: Diagnosis present

## 2015-02-17 DIAGNOSIS — C7802 Secondary malignant neoplasm of left lung: Secondary | ICD-10-CM | POA: Diagnosis not present

## 2015-02-17 DIAGNOSIS — Z79891 Long term (current) use of opiate analgesic: Secondary | ICD-10-CM | POA: Diagnosis not present

## 2015-02-17 DIAGNOSIS — D61818 Other pancytopenia: Secondary | ICD-10-CM | POA: Diagnosis present

## 2015-02-17 DIAGNOSIS — Z8542 Personal history of malignant neoplasm of other parts of uterus: Secondary | ICD-10-CM | POA: Diagnosis not present

## 2015-02-17 DIAGNOSIS — Z86711 Personal history of pulmonary embolism: Secondary | ICD-10-CM

## 2015-02-17 DIAGNOSIS — D61810 Antineoplastic chemotherapy induced pancytopenia: Secondary | ICD-10-CM

## 2015-02-17 DIAGNOSIS — D6832 Hemorrhagic disorder due to extrinsic circulating anticoagulants: Secondary | ICD-10-CM | POA: Diagnosis present

## 2015-02-17 DIAGNOSIS — C7951 Secondary malignant neoplasm of bone: Secondary | ICD-10-CM | POA: Diagnosis present

## 2015-02-17 DIAGNOSIS — Z781 Physical restraint status: Secondary | ICD-10-CM

## 2015-02-17 DIAGNOSIS — Z66 Do not resuscitate: Secondary | ICD-10-CM | POA: Diagnosis present

## 2015-02-17 DIAGNOSIS — Z79899 Other long term (current) drug therapy: Secondary | ICD-10-CM | POA: Insufficient documentation

## 2015-02-17 DIAGNOSIS — E46 Unspecified protein-calorie malnutrition: Secondary | ICD-10-CM | POA: Diagnosis present

## 2015-02-17 DIAGNOSIS — I10 Essential (primary) hypertension: Secondary | ICD-10-CM | POA: Diagnosis not present

## 2015-02-17 DIAGNOSIS — C7800 Secondary malignant neoplasm of unspecified lung: Secondary | ICD-10-CM | POA: Diagnosis present

## 2015-02-17 DIAGNOSIS — R51 Headache: Secondary | ICD-10-CM | POA: Diagnosis present

## 2015-02-17 DIAGNOSIS — R000 Tachycardia, unspecified: Secondary | ICD-10-CM | POA: Insufficient documentation

## 2015-02-17 DIAGNOSIS — S065X9A Traumatic subdural hemorrhage with loss of consciousness of unspecified duration, initial encounter: Secondary | ICD-10-CM

## 2015-02-17 DIAGNOSIS — Z515 Encounter for palliative care: Secondary | ICD-10-CM | POA: Diagnosis present

## 2015-02-17 DIAGNOSIS — S065XAA Traumatic subdural hemorrhage with loss of consciousness status unknown, initial encounter: Secondary | ICD-10-CM

## 2015-02-17 DIAGNOSIS — T451X5A Adverse effect of antineoplastic and immunosuppressive drugs, initial encounter: Secondary | ICD-10-CM

## 2015-02-17 DIAGNOSIS — Z882 Allergy status to sulfonamides status: Secondary | ICD-10-CM | POA: Diagnosis not present

## 2015-02-17 DIAGNOSIS — Z7901 Long term (current) use of anticoagulants: Secondary | ICD-10-CM

## 2015-02-17 DIAGNOSIS — K219 Gastro-esophageal reflux disease without esophagitis: Secondary | ICD-10-CM | POA: Diagnosis present

## 2015-02-17 DIAGNOSIS — R4182 Altered mental status, unspecified: Secondary | ICD-10-CM | POA: Diagnosis not present

## 2015-02-17 DIAGNOSIS — I6200 Nontraumatic subdural hemorrhage, unspecified: Secondary | ICD-10-CM

## 2015-02-17 DIAGNOSIS — C7801 Secondary malignant neoplasm of right lung: Secondary | ICD-10-CM | POA: Diagnosis not present

## 2015-02-17 DIAGNOSIS — Z6825 Body mass index (BMI) 25.0-25.9, adult: Secondary | ICD-10-CM | POA: Diagnosis not present

## 2015-02-17 HISTORY — PX: CRANIOTOMY: SHX93

## 2015-02-17 LAB — CBC
HEMATOCRIT: 27.1 % — AB (ref 35.0–47.0)
Hemoglobin: 8.7 g/dL — ABNORMAL LOW (ref 12.0–16.0)
MCH: 26.1 pg (ref 26.0–34.0)
MCHC: 32 g/dL (ref 32.0–36.0)
MCV: 81.6 fL (ref 80.0–100.0)
PLATELETS: 118 10*3/uL — AB (ref 150–440)
RBC: 3.32 MIL/uL — ABNORMAL LOW (ref 3.80–5.20)
RDW: 19 % — AB (ref 11.5–14.5)
WBC: 4.9 10*3/uL (ref 3.6–11.0)

## 2015-02-17 LAB — COMPREHENSIVE METABOLIC PANEL
ALT: 17 U/L (ref 14–54)
ANION GAP: 10 (ref 5–15)
AST: 44 U/L — AB (ref 15–41)
Albumin: 3.2 g/dL — ABNORMAL LOW (ref 3.5–5.0)
Alkaline Phosphatase: 272 U/L — ABNORMAL HIGH (ref 38–126)
BILIRUBIN TOTAL: 1.3 mg/dL — AB (ref 0.3–1.2)
BUN: 25 mg/dL — AB (ref 6–20)
CO2: 25 mmol/L (ref 22–32)
Calcium: 8.5 mg/dL — ABNORMAL LOW (ref 8.9–10.3)
Chloride: 103 mmol/L (ref 101–111)
Creatinine, Ser: 0.79 mg/dL (ref 0.44–1.00)
Glucose, Bld: 138 mg/dL — ABNORMAL HIGH (ref 65–99)
POTASSIUM: 3.8 mmol/L (ref 3.5–5.1)
Sodium: 138 mmol/L (ref 135–145)
TOTAL PROTEIN: 6.9 g/dL (ref 6.5–8.1)

## 2015-02-17 LAB — PROTIME-INR
INR: 11.62
INR: 1.16 (ref 0.00–1.49)
INR: 1.36 (ref 0.00–1.49)
PROTHROMBIN TIME: 84.7 s — AB (ref 11.4–15.0)
PROTHROMBIN TIME: 16.9 s — AB (ref 11.6–15.2)
Prothrombin Time: 15 seconds (ref 11.6–15.2)

## 2015-02-17 LAB — APTT: APTT: 148 s — AB (ref 24–36)

## 2015-02-17 LAB — TROPONIN I

## 2015-02-17 LAB — ABO/RH: ABO/RH(D): O POS

## 2015-02-17 LAB — GLUCOSE, CAPILLARY: GLUCOSE-CAPILLARY: 122 mg/dL — AB (ref 65–99)

## 2015-02-17 SURGERY — CRANIOTOMY HEMATOMA EVACUATION SUBDURAL
Anesthesia: General | Laterality: Left

## 2015-02-17 SURGERY — CRANIOTOMY HEMATOMA EVACUATION SUBDURAL
Anesthesia: General | Site: Head | Laterality: Left

## 2015-02-17 MED ORDER — LIDOCAINE-EPINEPHRINE 1 %-1:100000 IJ SOLN
INTRAMUSCULAR | Status: DC | PRN
  Administered 2015-02-17: 4 mL via INTRADERMAL

## 2015-02-17 MED ORDER — MORPHINE SULFATE (PF) 2 MG/ML IV SOLN
INTRAVENOUS | Status: AC
  Administered 2015-02-17: 2 mg via INTRAVENOUS
  Filled 2015-02-17: qty 1

## 2015-02-17 MED ORDER — MIRTAZAPINE 15 MG PO TABS: 15.0000 mg | ORAL_TABLET | Freq: Every day | ORAL | Status: DC

## 2015-02-17 MED ORDER — PANTOPRAZOLE SODIUM 40 MG IV SOLR
40.0000 mg | Freq: Every day | INTRAVENOUS | Status: DC
  Administered 2015-02-17 – 2015-02-18 (×2): 40 mg via INTRAVENOUS
  Filled 2015-02-17 (×2): qty 40

## 2015-02-17 MED ORDER — FAMOTIDINE 20 MG PO TABS: 20.0000 mg | ORAL_TABLET | Freq: Two times a day (BID) | ORAL | Status: DC

## 2015-02-17 MED ORDER — FENTANYL 100 MCG/HR TD PT72
100.0000 ug | MEDICATED_PATCH | TRANSDERMAL | Status: DC
  Administered 2015-02-17: 100 ug via TRANSDERMAL
  Filled 2015-02-17: qty 1

## 2015-02-17 MED ORDER — LIDOCAINE HCL (CARDIAC) 20 MG/ML IV SOLN
INTRAVENOUS | Status: DC | PRN
  Administered 2015-02-17: 100 mg via INTRAVENOUS

## 2015-02-17 MED ORDER — CEFAZOLIN SODIUM-DEXTROSE 2-3 GM-% IV SOLR
INTRAVENOUS | Status: AC
  Filled 2015-02-17: qty 50

## 2015-02-17 MED ORDER — SODIUM CHLORIDE 0.9 % IR SOLN
Status: DC | PRN
  Administered 2015-02-17: 500 mL

## 2015-02-17 MED ORDER — MEPERIDINE HCL 25 MG/ML IJ SOLN: 6.2500 mg | INTRAMUSCULAR | Status: DC | PRN

## 2015-02-17 MED ORDER — MIDAZOLAM HCL 2 MG/2ML IJ SOLN
INTRAMUSCULAR | Status: AC
  Filled 2015-02-17: qty 2

## 2015-02-17 MED ORDER — ATENOLOL 50 MG PO TABS: 50.0000 mg | ORAL_TABLET | Freq: Every day | ORAL | Status: DC

## 2015-02-17 MED ORDER — ACETAMINOPHEN 325 MG PO TABS: 650.0000 mg | ORAL_TABLET | ORAL | Status: DC | PRN

## 2015-02-17 MED ORDER — ONDANSETRON HCL 4 MG/2ML IJ SOLN
4.0000 mg | Freq: Once | INTRAMUSCULAR | Status: AC
  Administered 2015-02-17: 4 mg via INTRAVENOUS
  Filled 2015-02-17: qty 2

## 2015-02-17 MED ORDER — CEFAZOLIN SODIUM-DEXTROSE 2-3 GM-% IV SOLR
INTRAVENOUS | Status: DC | PRN
  Administered 2015-02-17: 2 g via INTRAVENOUS

## 2015-02-17 MED ORDER — BUPIVACAINE HCL (PF) 0.5 % IJ SOLN
INTRAMUSCULAR | Status: DC | PRN
  Administered 2015-02-17: 4 mL

## 2015-02-17 MED ORDER — MORPHINE SULFATE (PF) 4 MG/ML IV SOLN
INTRAVENOUS | Status: AC
  Administered 2015-02-17: 2 mg via INTRAVENOUS
  Filled 2015-02-17: qty 1

## 2015-02-17 MED ORDER — LIDOCAINE-PRILOCAINE 2.5-2.5 % EX CREA: 1.0000 "application " | TOPICAL_CREAM | CUTANEOUS | Status: DC | PRN

## 2015-02-17 MED ORDER — PROMETHAZINE HCL 25 MG/ML IJ SOLN: 6.2500 mg | INTRAMUSCULAR | Status: DC | PRN

## 2015-02-17 MED ORDER — OXYCODONE HCL 5 MG PO TABS: 30.0000 mg | ORAL_TABLET | ORAL | Status: DC | PRN

## 2015-02-17 MED ORDER — SUGAMMADEX SODIUM 200 MG/2ML IV SOLN
INTRAVENOUS | Status: AC
  Filled 2015-02-17: qty 2

## 2015-02-17 MED ORDER — FENTANYL CITRATE (PF) 250 MCG/5ML IJ SOLN
INTRAMUSCULAR | Status: AC
  Filled 2015-02-17: qty 5

## 2015-02-17 MED ORDER — SODIUM CHLORIDE 0.9 % IV SOLN
INTRAVENOUS | Status: DC | PRN
  Administered 2015-02-17: 18:00:00 via INTRAVENOUS

## 2015-02-17 MED ORDER — SUGAMMADEX SODIUM 500 MG/5ML IV SOLN
INTRAVENOUS | Status: DC | PRN
  Administered 2015-02-17: 525 mg via INTRAVENOUS

## 2015-02-17 MED ORDER — PHENYLEPHRINE HCL 10 MG/ML IJ SOLN
INTRAMUSCULAR | Status: DC | PRN
  Administered 2015-02-17: 40 ug via INTRAVENOUS

## 2015-02-17 MED ORDER — PHENYLEPHRINE 40 MCG/ML (10ML) SYRINGE FOR IV PUSH (FOR BLOOD PRESSURE SUPPORT)
PREFILLED_SYRINGE | INTRAVENOUS | Status: AC
  Filled 2015-02-17: qty 20

## 2015-02-17 MED ORDER — SUGAMMADEX SODIUM 500 MG/5ML IV SOLN
INTRAVENOUS | Status: AC
  Filled 2015-02-17: qty 5

## 2015-02-17 MED ORDER — THROMBIN 5000 UNITS EX SOLR
OROMUCOSAL | Status: DC | PRN
  Administered 2015-02-17: 5 mL via TOPICAL

## 2015-02-17 MED ORDER — SUCRALFATE 1 G PO TABS
1.0000 g | ORAL_TABLET | Freq: Three times a day (TID) | ORAL | Status: DC
  Filled 2015-02-17 (×10): qty 1

## 2015-02-17 MED ORDER — PROTHROMBIN COMPLEX CONC HUMAN 500 UNITS IV KIT
3885.0000 [IU] | PACK | Status: AC
  Administered 2015-02-17: 3885 [IU] via INTRAVENOUS
  Filled 2015-02-17: qty 155

## 2015-02-17 MED ORDER — LORATADINE 10 MG PO TABS: 10.0000 mg | ORAL_TABLET | Freq: Every day | ORAL | Status: DC

## 2015-02-17 MED ORDER — PROPOFOL 10 MG/ML IV BOLUS
INTRAVENOUS | Status: DC | PRN
  Administered 2015-02-17: 100 mg via INTRAVENOUS
  Administered 2015-02-17: 50 mg via INTRAVENOUS

## 2015-02-17 MED ORDER — LABETALOL HCL 5 MG/ML IV SOLN
10.0000 mg | INTRAVENOUS | Status: DC | PRN
  Administered 2015-02-18 – 2015-02-19 (×4): 20 mg via INTRAVENOUS
  Administered 2015-02-19: 40 mg via INTRAVENOUS
  Administered 2015-02-19 (×2): 20 mg via INTRAVENOUS
  Filled 2015-02-17 (×2): qty 8
  Filled 2015-02-17 (×2): qty 4
  Filled 2015-02-17: qty 8

## 2015-02-17 MED ORDER — ONDANSETRON HCL 4 MG PO TABS: 4.0000 mg | ORAL_TABLET | Freq: Four times a day (QID) | ORAL | Status: DC | PRN

## 2015-02-17 MED ORDER — BACITRACIN ZINC 500 UNIT/GM EX OINT
TOPICAL_OINTMENT | CUTANEOUS | Status: DC | PRN
  Administered 2015-02-17: 1 via TOPICAL

## 2015-02-17 MED ORDER — VITAMIN K1 10 MG/ML IJ SOLN
10.0000 mg | Freq: Once | INTRAVENOUS | Status: AC
  Administered 2015-02-17: 10 mg via INTRAVENOUS
  Filled 2015-02-17 (×2): qty 1

## 2015-02-17 MED ORDER — 0.9 % SODIUM CHLORIDE (POUR BTL) OPTIME
TOPICAL | Status: DC | PRN
  Administered 2015-02-17 (×2): 1000 mL

## 2015-02-17 MED ORDER — PSYLLIUM 95 % PO PACK
1.0000 | PACK | Freq: Three times a day (TID) | ORAL | Status: DC | PRN
  Filled 2015-02-17: qty 1

## 2015-02-17 MED ORDER — ROCURONIUM BROMIDE 100 MG/10ML IV SOLN
INTRAVENOUS | Status: DC | PRN
  Administered 2015-02-17: 50 mg via INTRAVENOUS

## 2015-02-17 MED ORDER — MORPHINE SULFATE (PF) 4 MG/ML IV SOLN
4.0000 mg | Freq: Once | INTRAVENOUS | Status: AC
  Administered 2015-02-17: 2 mg via INTRAVENOUS

## 2015-02-17 MED ORDER — CITALOPRAM HYDROBROMIDE 10 MG PO TABS: 40.0000 mg | ORAL_TABLET | Freq: Every day | ORAL | Status: DC

## 2015-02-17 MED ORDER — ACETAMINOPHEN 650 MG RE SUPP: 650.0000 mg | RECTAL | Status: DC | PRN

## 2015-02-17 MED ORDER — FENTANYL CITRATE (PF) 100 MCG/2ML IJ SOLN
INTRAMUSCULAR | Status: DC | PRN
  Administered 2015-02-17: 200 ug via INTRAVENOUS
  Administered 2015-02-17 (×2): 50 ug via INTRAVENOUS

## 2015-02-17 MED ORDER — GABAPENTIN 300 MG PO CAPS: 300.0000 mg | ORAL_CAPSULE | Freq: Two times a day (BID) | ORAL | Status: DC

## 2015-02-17 MED ORDER — FENTANYL CITRATE (PF) 100 MCG/2ML IJ SOLN: 25.0000 ug | INTRAMUSCULAR | Status: DC | PRN

## 2015-02-17 MED ORDER — SENNOSIDES-DOCUSATE SODIUM 8.6-50 MG PO TABS: 1.0000 | ORAL_TABLET | Freq: Two times a day (BID) | ORAL | Status: DC

## 2015-02-17 MED ORDER — MORPHINE SULFATE (PF) 2 MG/ML IV SOLN
1.0000 mg | INTRAVENOUS | Status: DC | PRN
  Administered 2015-02-18: 1 mg via INTRAVENOUS
  Administered 2015-02-19 (×2): 2 mg via INTRAVENOUS
  Administered 2015-02-19: 4 mg via INTRAVENOUS
  Administered 2015-02-19: 2 mg via INTRAVENOUS
  Filled 2015-02-17 (×2): qty 1
  Filled 2015-02-17: qty 2
  Filled 2015-02-17 (×2): qty 1

## 2015-02-17 MED ORDER — SODIUM CHLORIDE 0.9 % IV SOLN
1000.0000 mL | Freq: Once | INTRAVENOUS | Status: AC
  Administered 2015-02-17: 1000 mL via INTRAVENOUS

## 2015-02-17 MED ORDER — SODIUM CHLORIDE 0.9 % IV SOLN
1000.0000 mg | INTRAVENOUS | Status: DC
  Filled 2015-02-17: qty 10

## 2015-02-17 MED ORDER — ADULT MULTIVITAMIN W/MINERALS CH: 1.0000 | ORAL_TABLET | Freq: Every day | ORAL | Status: DC

## 2015-02-17 MED ORDER — CYCLOBENZAPRINE HCL 10 MG PO TABS: 5.0000 mg | ORAL_TABLET | Freq: Three times a day (TID) | ORAL | Status: DC | PRN

## 2015-02-17 MED ORDER — PHENYLEPHRINE HCL 10 MG/ML IJ SOLN
10.0000 mg | INTRAVENOUS | Status: DC | PRN
  Administered 2015-02-17: 20 ug/min via INTRAVENOUS

## 2015-02-17 MED ORDER — LEVETIRACETAM 500 MG/5ML IV SOLN
500.0000 mg | Freq: Two times a day (BID) | INTRAVENOUS | Status: DC
  Administered 2015-02-17 – 2015-02-19 (×4): 500 mg via INTRAVENOUS
  Filled 2015-02-17 (×5): qty 5

## 2015-02-17 MED ORDER — PROPOFOL 10 MG/ML IV BOLUS
INTRAVENOUS | Status: AC
  Filled 2015-02-17: qty 20

## 2015-02-17 MED ORDER — THROMBIN 20000 UNITS EX SOLR
CUTANEOUS | Status: DC | PRN
  Administered 2015-02-17: 20 mL via TOPICAL

## 2015-02-17 MED ORDER — LACTATED RINGERS IV SOLN: INTRAVENOUS | Status: DC

## 2015-02-17 SURGICAL SUPPLY — 76 items
BATTERY IQ STERILE (MISCELLANEOUS) ×2 IMPLANT
BENZOIN TINCTURE PRP APPL 2/3 (GAUZE/BANDAGES/DRESSINGS) IMPLANT
BLADE CLIPPER SURG (BLADE) ×2 IMPLANT
BLADE ULTRA TIP 2M (BLADE) ×2 IMPLANT
BNDG GAUZE ELAST 4 BULKY (GAUZE/BANDAGES/DRESSINGS) IMPLANT
BRUSH SCRUB EZ 1% IODOPHOR (MISCELLANEOUS) ×2 IMPLANT
BUR ACORN 6.0 PRECISION (BURR) ×2 IMPLANT
BUR ADDG 1.1 (BURR) IMPLANT
BUR MATCHSTICK NEURO 3.0 LAGG (BURR) IMPLANT
BUR SPIRAL ROUTER 2.3 (BUR) IMPLANT
CANISTER SUCT 3000ML PPV (MISCELLANEOUS) ×2 IMPLANT
CLIP TI MEDIUM 6 (CLIP) IMPLANT
DRAIN SNY WOU 7FLT (WOUND CARE) IMPLANT
DRAPE NEUROLOGICAL W/INCISE (DRAPES) ×2 IMPLANT
DRAPE SURG 17X23 STRL (DRAPES) IMPLANT
DRAPE WARM FLUID 44X44 (DRAPE) ×2 IMPLANT
DRSG TELFA 3X8 NADH (GAUZE/BANDAGES/DRESSINGS) ×2 IMPLANT
DURAMATRIX ONLAY 3X3 (Plate) ×2 IMPLANT
DURAPREP 6ML APPLICATOR 50/CS (WOUND CARE) ×2 IMPLANT
ELECT CAUTERY BLADE 6.4 (BLADE) ×2 IMPLANT
ELECT REM PT RETURN 9FT ADLT (ELECTROSURGICAL) ×2
ELECTRODE REM PT RTRN 9FT ADLT (ELECTROSURGICAL) ×1 IMPLANT
EVACUATOR 1/8 PVC DRAIN (DRAIN) IMPLANT
EVACUATOR SILICONE 100CC (DRAIN) IMPLANT
GAUZE SPONGE 4X4 12PLY STRL (GAUZE/BANDAGES/DRESSINGS) ×2 IMPLANT
GAUZE SPONGE 4X4 16PLY XRAY LF (GAUZE/BANDAGES/DRESSINGS) IMPLANT
GLOVE BIO SURGEON STRL SZ 6.5 (GLOVE) ×4 IMPLANT
GLOVE BIO SURGEON STRL SZ7 (GLOVE) ×2 IMPLANT
GLOVE BIOGEL PI IND STRL 7.5 (GLOVE) ×1 IMPLANT
GLOVE BIOGEL PI INDICATOR 7.5 (GLOVE) ×1
GLOVE ECLIPSE 7.0 STRL STRAW (GLOVE) ×4 IMPLANT
GLOVE EXAM NITRILE LRG STRL (GLOVE) IMPLANT
GLOVE EXAM NITRILE MD LF STRL (GLOVE) IMPLANT
GLOVE EXAM NITRILE XL STR (GLOVE) IMPLANT
GLOVE EXAM NITRILE XS STR PU (GLOVE) IMPLANT
GLOVE INDICATOR 7.0 STRL GRN (GLOVE) ×2 IMPLANT
GOWN STRL REUS W/ TWL LRG LVL3 (GOWN DISPOSABLE) ×3 IMPLANT
GOWN STRL REUS W/ TWL XL LVL3 (GOWN DISPOSABLE) IMPLANT
GOWN STRL REUS W/TWL 2XL LVL3 (GOWN DISPOSABLE) IMPLANT
GOWN STRL REUS W/TWL LRG LVL3 (GOWN DISPOSABLE) ×3
GOWN STRL REUS W/TWL XL LVL3 (GOWN DISPOSABLE)
HEMOSTAT POWDER KIT SURGIFOAM (HEMOSTASIS) ×2 IMPLANT
HEMOSTAT SURGICEL 2X14 (HEMOSTASIS) IMPLANT
KIT BASIN OR (CUSTOM PROCEDURE TRAY) ×2 IMPLANT
KIT ROOM TURNOVER OR (KITS) ×2 IMPLANT
NEEDLE HYPO 25X1 1.5 SAFETY (NEEDLE) ×2 IMPLANT
NS IRRIG 1000ML POUR BTL (IV SOLUTION) ×2 IMPLANT
PACK CRANIOTOMY (CUSTOM PROCEDURE TRAY) ×2 IMPLANT
PATTIES SURGICAL .5 X.5 (GAUZE/BANDAGES/DRESSINGS) IMPLANT
PATTIES SURGICAL .5 X3 (DISPOSABLE) IMPLANT
PATTIES SURGICAL 1X1 (DISPOSABLE) IMPLANT
PLATE 1.5  2HOLE LNG NEURO (Plate) ×2 IMPLANT
PLATE 1.5 2HOLE LNG NEURO (Plate) ×2 IMPLANT
PLATE 1.5/0.5 13MM BURR HOLE (Plate) ×2 IMPLANT
SCREW SELF DRILL HT 1.5/4MM (Screw) ×14 IMPLANT
SPONGE NEURO XRAY DETECT 1X3 (DISPOSABLE) IMPLANT
SPONGE SURGIFOAM ABS GEL 100 (HEMOSTASIS) ×2 IMPLANT
STAPLER VISISTAT 35W (STAPLE) ×2 IMPLANT
STOCKINETTE 6  STRL (DRAPES) ×1
STOCKINETTE 6 STRL (DRAPES) ×1 IMPLANT
SUT ETHILON 3 0 FSL (SUTURE) IMPLANT
SUT ETHILON 3 0 PS 1 (SUTURE) IMPLANT
SUT NURALON 4 0 TR CR/8 (SUTURE) ×6 IMPLANT
SUT PL GUT 3 0 FS 1 (SUTURE) IMPLANT
SUT STEEL 0 (SUTURE)
SUT STEEL 0 18XMFL TIE 17 (SUTURE) IMPLANT
SUT VIC AB 0 CT1 18XCR BRD8 (SUTURE) ×2 IMPLANT
SUT VIC AB 0 CT1 8-18 (SUTURE) ×2
SUT VIC AB 3-0 SH 8-18 (SUTURE) ×4 IMPLANT
SYR CONTROL 10ML LL (SYRINGE) ×4 IMPLANT
TOWEL OR 17X24 6PK STRL BLUE (TOWEL DISPOSABLE) ×2 IMPLANT
TOWEL OR 17X26 10 PK STRL BLUE (TOWEL DISPOSABLE) ×2 IMPLANT
TRAY FOLEY W/METER SILVER 14FR (SET/KITS/TRAYS/PACK) ×2 IMPLANT
TUBE CONNECTING 12X1/4 (SUCTIONS) ×2 IMPLANT
UNDERPAD 30X30 INCONTINENT (UNDERPADS AND DIAPERS) ×2 IMPLANT
WATER STERILE IRR 1000ML POUR (IV SOLUTION) ×2 IMPLANT

## 2015-02-17 NOTE — Anesthesia Preprocedure Evaluation (Signed)
Anesthesia Evaluation  Patient identified by MRN, date of birth, ID band Patient awake    Reviewed: Allergy & Precautions, NPO status , Patient's Chart, lab work & pertinent test results, reviewed documented beta blocker date and time   Airway Mallampati: II  TM Distance: >3 FB Neck ROM: Full    Dental  (+) Teeth Intact   Pulmonary neg pulmonary ROS,    breath sounds clear to auscultation       Cardiovascular hypertension, Pt. on medications and Pt. on home beta blockers  Rhythm:Regular Rate:Normal     Neuro/Psych negative neurological ROS  negative psych ROS   GI/Hepatic GERD  Medicated,  Endo/Other  negative endocrine ROS  Renal/GU Renal InsufficiencyRenal disease  negative genitourinary   Musculoskeletal  (+) Arthritis ,   Abdominal   Peds negative pediatric ROS (+)  Hematology negative hematology ROS (+)   Anesthesia Other Findings - Endometrial Cancer  Reproductive/Obstetrics negative OB ROS                             Anesthesia Physical Anesthesia Plan  ASA: III  Anesthesia Plan: General   Post-op Pain Management:    Induction: Intravenous  Airway Management Planned: Oral ETT  Additional Equipment: Arterial line  Intra-op Plan:   Post-operative Plan: Extubation in OR  Informed Consent: I have reviewed the patients History and Physical, chart, labs and discussed the procedure including the risks, benefits and alternatives for the proposed anesthesia with the patient or authorized representative who has indicated his/her understanding and acceptance.   Dental advisory given  Plan Discussed with: CRNA  Anesthesia Plan Comments:         Anesthesia Quick Evaluation

## 2015-02-17 NOTE — Op Note (Signed)
PREOP DIAGNOSIS: Left subdural hematoma  POSTOP DIAGNOSIS: Same  PROCEDURE: 1. Left frontoparietal craniotomy for evacuation of subdural hematoma  SURGEON: Dr. Consuella Lose, MD  ASSISTANT: None  ANESTHESIA: General Endotracheal  EBL: 100cc  SPECIMENS: None  DRAINS: subdural drain  COMPLICATIONS: None immediate  CONDITION: Hemodynamically stable to PACU  HISTORY: Emily Zamora is a 60 y.o. female presenting to the ED with confusion, HA, and N/V for several days. She is on coumadin for previous PE and has a history of renal insufficiency and metastatic endometrial CA. CT head demonstrated a large left SDH with significant mass effect. Surgical evacuation was therefore indicated. Risks and benefits were reviewed with the patient's family and after all questions were answered, consent was obtained.  PROCEDURE IN DETAIL: After informed consent was obtained and witnessed, the patient was brought to the operating room. After induction of general anesthesia, the patient was positioned on the operative table in the supine position. All pressure points were meticulously padded. Skin incision was then marked out and prepped and draped in the usual sterile fashion.  After timeout was conducted, the skin incision was infiltrated with local anesthetic. Skin incision was then made sharply, and Bovie electrocautery was used to dissect the subcutaneous tissue and the galea was incised. Hemostasis was achieved on the skin edges with Raney clips. Self-retaining retractors were placed. Despite normal INR preop, her blood appeared somewhat thin. Bur holes were then created and a craniotomy was fashioned and elevated. Hemostasis was then achieved on the dural surface with bipolar electrocautery. The dura was then opened in cruciate fashion.  There was acute subdural clot seen more medially with a component of more chronic subdural fluid encountered. This was drained, and the clot that was visible in all  directions was removed. I was able to visualize the temporal floor, anterior fossa floor, and the falx medially. Having removed all visible clot, the subdural space was irrigated copiously and no bleeding was identified.  A subdural drain was placed and tunneled subcutaneously. A piece of DuraMatrix was then placed over the dural surface. Bone was then plated and replaced with a cutout made for the drain.  The galea was closed using interrupted 3-0 Vicryl sutures. The skin was closed using standard surgical skin staples. Sterile dressing was then applied. The patient was then transferred to the stretcher and taken to the PACU in stable hemodynamic condition.  At the end of the case all sponge, needle, cottonoid, and instrument counts were correct.

## 2015-02-17 NOTE — Progress Notes (Signed)
55yof c/o HA, dizziness, N/V for a couple of days CT + SDH > to [facility] for crainiotomy and drain  PMH endometrial cancer with bone mets, Hx PE - on warf - admit INR 11 (up from 2.7 three weeks ago), h/h low 8.7/27.1 drop from 10/31 last week  S/p Vit K 10mg  x1, Kcentra follow up INR 1.16  Per protocol will check Q6hr protime x4 then QD x2  Bonnita Nasuti Pharm.D. CPP, BCPS Clinical Pharmacist [phone] 02/17/2015 10:04 PM

## 2015-02-17 NOTE — Anesthesia Procedure Notes (Signed)
Procedure Name: Intubation Date/Time: 02/17/2015 6:32 PM Performed by: Clearnce Sorrel Pre-anesthesia Checklist: Patient identified, Timeout performed, Emergency Drugs available, Suction available and Patient being monitored Patient Re-evaluated:Patient Re-evaluated prior to inductionOxygen Delivery Method: Circle system utilized Preoxygenation: Pre-oxygenation with 100% oxygen Intubation Type: IV induction and Cricoid Pressure applied Ventilation: Mask ventilation without difficulty Laryngoscope Size: Mac and 3 Grade View: Grade I Tube type: Oral Tube size: 7.0 mm Number of attempts: 1 Airway Equipment and Method: Stylet Placement Confirmation: ETT inserted through vocal cords under direct vision,  breath sounds checked- equal and bilateral and positive ETCO2 Secured at: 21 cm Tube secured with: Tape Dental Injury: Teeth and Oropharynx as per pre-operative assessment

## 2015-02-17 NOTE — ED Provider Notes (Addendum)
[facility] Emergency Department Provider Note  ____________________________________________   I have reviewed the triage vital signs and the nursing notes.   HISTORY  Chief Complaint Headache, nausea, dizziness   HPI Emily Zamora is a 60 y.o. female who presents with 3 days of headache. She reports she does not have headaches. She reports she has significant pressure behind both eyes. She denies neuro deficits. She denies sensory deficits. No fevers no chills. She has had nausea and vomiting. She received immunotherapy treatment last Monday. She does have endometrial cancer and is followed at [facility] by Dr. Oliva Bustard. She is on Coumadin for a history of PE in the past.    Past Medical History  Diagnosis Date  . Cancer ([place])   . Primary cancer of endometrium ([place]) 05/24/2014  . Hypertension   . Knee pain, bilateral   . Arthritis   . Renal insufficiency     Patient Active Problem List   Diagnosis Date Noted  . Protein-calorie malnutrition, severe 12/17/2014  . Endometrial cancer, FIGO stage IVA ([place]) 12/16/2014  . Neutropenic fever ([place]) 11/21/2014  . Diarrhea 11/21/2014  . Right knee pain 08/27/2014  . Primary cancer of endometrium ([place]) 05/24/2014  . Endometrial cancer ([place]) 05/24/2014  . Hepatic cirrhosis ([place]) 10/03/2013    Past Surgical History  Procedure Laterality Date  . Gallbladder surgery  2014  . Abdominal hysterectomy      Current Outpatient Rx  Name  Route  Sig  Dispense  Refill  . atenolol (TENORMIN) 100 MG tablet   Oral   Take 1 tablet (100 mg total) by mouth daily.   30 tablet   5     May get 1 month refill then must be seen before an ...   . citalopram (CELEXA) 40 MG tablet   Oral   Take 1 tablet (40 mg total) by mouth daily.   30 tablet   3   . [facility] SENNA PLUS 8.6-50 MG tablet   Oral   Take 1 tablet by mouth 2 (two) times daily.      3     Dispense as written.   . cyclobenzaprine (FLEXERIL) 5 MG  tablet   Oral   Take 1 tablet (5 mg total) by mouth 3 (three) times daily as needed for muscle spasms.   30 tablet   3   . dexamethasone (DECADRON) 4 MG tablet   Oral   Take 8 mg by mouth 2 (two) times daily. Pt is to take the day before chemo(Taxotere).         . famotidine (PEPCID) 20 MG tablet   Oral   Take 1 tablet (20 mg total) by mouth 2 (two) times daily.   60 tablet   5   . fentaNYL (DURAGESIC - DOSED MCG/HR) 100 MCG/HR   Transdermal   Place 1 patch (100 mcg total) onto the skin every 3 (three) days.   10 patch   0   . gabapentin (NEURONTIN) 300 MG capsule   Oral   Take 1 capsule (300 mg total) by mouth 2 (two) times daily.   60 capsule   3   . lidocaine-prilocaine (EMLA) cream   Topical   Apply 1 application topically as needed. Patient taking differently: Apply 1 application topically as needed (prior to accessing port).    30 g   3   . loratadine (CLARITIN) 10 MG tablet   Oral   Take 1 tablet (10 mg total) by mouth  daily.   30 tablet   5   . mirtazapine (REMERON) 15 MG tablet   Oral   Take 1 tablet (15 mg total) by mouth at bedtime.   30 tablet   3   . Multiple Vitamin (MULTIVITAMIN WITH MINERALS) TABS tablet   Oral   Take 1 tablet by mouth daily.         . ondansetron (ZOFRAN) 4 MG tablet   Oral   Take 1 tablet (4 mg total) by mouth every 6 (six) hours as needed.   30 tablet   3   . oxyCODONE (ROXICODONE) 15 MG immediate release tablet   Oral   Take 2 tablets (30 mg total) by mouth every 2 (two) hours as needed for pain.   90 tablet   0   . psyllium (METAMUCIL) 58.6 % powder   Oral   Take 1 packet by mouth 3 (three) times daily as needed (for constipation).          . sucralfate (CARAFATE) 1 G tablet   Oral   Take 1 tablet (1 g total) by mouth 4 (four) times daily -  with meals and at bedtime.   90 tablet   3   . warfarin (COUMADIN) 5 MG tablet      Take 7.5mg  PO on MWF. Take 5mg  PO on TuThSaSu.            Allergies Sulfa antibiotics  Family History  Problem Relation Age of Onset  . Breast cancer Sister 40  . Hypertension Sister   . Diabetes Mother   . Hypertension Mother     Social History Social History  Substance Use Topics  . Smoking status: Never Smoker   . Smokeless tobacco: None  . Alcohol Use: No    Review of Systems  Constitutional: "Borderline fevers " Eyes: Negative for visual changes  ENT: Negative for sore throat Cardiovascular: Negative for chest pain. Respiratory: Negative for cough Gastrointestinal: Negative for abdominal pain Genitourinary: Negative for dysuria. Musculoskeletal: Negative for neck pain Skin: Negative for rash. Neurological: No focal weakness Psychiatric: No anxiety    ____________________________________________   PHYSICAL EXAM:  VITAL SIGNS: ED Triage Vitals  Enc Vitals Group     BP 02/17/15 1148 122/79 mmHg     Pulse Rate 02/17/15 1148 115     Resp 02/17/15 1148 18     Temp 02/17/15 1148 97.8 F (36.6 C)     Temp Source 02/17/15 1148 Oral     SpO2 02/17/15 1148 97 %     Weight 02/17/15 1148 151 lb (68.493 kg)     Height 02/17/15 1148 5\' 4"  (1.626 m)     Head Cir --      Peak Flow --      Pain Score 02/17/15 1405 4     Pain Loc --      Pain Edu? --      Excl. in [place]? --      Constitutional: Alert and oriented. Pleasant and interactive, but clearly uncomfortable Eyes: Conjunctivae are normal. PERRLA  ENT   Head: Normocephalic and atraumatic.   Mouth/Throat: Mucous membranes are moist. Cardiovascular: Tachycardia, regular rhythm. Normal and symmetric distal pulses are present in all extremities. Right chest port noted Respiratory: Normal respiratory effort without tachypnea nor retractions. Breath sounds are clear and equal bilaterally.  Gastrointestinal: Soft and non-tender in all quadrants. No distention. . Genitourinary: deferred Musculoskeletal: Nontender with normal range of motion in all extremities.  No lower extremity tenderness  nor edema. Normal strength in all extremities Neurologic:  Normal speech and language. Normal strength in upper and lower extremities, cranial nerves II through XII normal Skin:  Skin is warm, dry and intact. No rash noted. Psychiatric: Mood and affect are normal. Patient exhibits appropriate insight and judgment.  ____________________________________________    LABS (pertinent positives/negatives)  Labs Reviewed  CBC - Abnormal; Notable for the following:    RBC 3.32 (*)    Hemoglobin 8.7 (*)    HCT 27.1 (*)    RDW 19.0 (*)    Platelets 118 (*)    All other components within normal limits  COMPREHENSIVE METABOLIC PANEL  TROPONIN I    ____________________________________________   EKG  ED ECG REPORT I, Lavonia Drafts, the attending physician, personally viewed and interpreted this ECG.  Date: 02/17/2015 EKG Time: 1:35 PM Rate: 108 Rhythm: Sinus tachycardia QRS Axis: normal Intervals: normal ST/T Wave abnormalities: normal Conduction Disturbances: none Narrative Interpretation: unremarkable   ____________________________________________    RADIOLOGY I have personally reviewed any xrays that were ordered on this patient: CT head shows large left subdural hematoma acute on chronic, 12 mm of right to left midline shift and mild subfalcine herniation  ____________________________________________   PROCEDURES  Procedure(s) performed: none  Critical Care performed: yes CRITICAL CARE Performed by: Lavonia Drafts   Total critical care time: 7minutes  Critical care time was exclusive of separately billable procedures and treating other patients.  Critical care was necessary to treat or prevent imminent or life-threatening deterioration.  Critical care was time spent personally by me on the following activities: development of treatment plan with patient and/or surrogate as well as nursing, discussions with consultants, evaluation  of patient's response to treatment, examination of patient, obtaining history from patient or surrogate, ordering and performing treatments and interventions, ordering and review of laboratory studies, ordering and review of radiographic studies, pulse oximetry and re-evaluation of patient's condition.   ____________________________________________   INITIAL IMPRESSION / ASSESSMENT AND PLAN / ED COURSE  Pertinent labs & imaging results that were available during my care of the patient were reviewed by me and considered in my medical decision making (see chart for details).  Patient with a history of cancer on Coumadin undergoing chemotherapy presents with new headaches area no neuro deficits. She is nauseous and tachycardic. We will obtain CT head, give IV Zofran and fluids and reevaluate  ----------------------------------------- 2:52 PM on 02/17/2015 -----------------------------------------  Called by radiologist regarding large left subdural hematoma. Discussed this with the patient. We will give 10 mg of IV vitamin K and K-centra. I have paged [facility] to discuss transfer  ----------------------------------------- 3:13 PM on 02/17/2015 -----------------------------------------  Dr. Ralene Ok accepts the patient.   Discussed with patient that her condition is very serious and she is aware. We have given morphine for pain.  ----------------------------------------- 3:18 PM on 02/17/2015 -----------------------------------------  Patient remains neurologically intact, critical care truck is reportedly en route   ____________________________________________   FINAL CLINICAL IMPRESSION(S) / ED DIAGNOSES  Final diagnoses:  Subdural hemorrhage (HCC)     Lavonia Drafts, MD 02/17/15 1515  Lavonia Drafts, MD 02/17/15 [id number]

## 2015-02-17 NOTE — ED Notes (Signed)
Patient has Stage IV bone cancer.  Receiving immune therapy.  (Opdivo), last treatment last monday.  C/O headaches x 3 days.  Vomiting intermittently x 2 days.  Patient c/o weakness.

## 2015-02-17 NOTE — Anesthesia Preprocedure Evaluation (Signed)
Anesthesia Evaluation  Patient identified by MRN, date of birth, ID band Patient awake    Reviewed: Allergy & Precautions, H&P , NPO status , Patient's Chart, lab work & pertinent test results  Airway Mallampati: II  TM Distance: >3 FB Neck ROM: full    Dental no notable dental hx. (+) Dental Advisory Given, Teeth Intact   Pulmonary neg pulmonary ROS,    Pulmonary exam normal breath sounds clear to auscultation       Cardiovascular Exercise Tolerance: Good hypertension, Pt. on home beta blockers Normal cardiovascular exam Rhythm:regular Rate:Normal     Neuro/Psych Subdural hematoma negative psych ROS   GI/Hepatic negative GI ROS, Neg liver ROS,   Endo/Other  negative endocrine ROS  Renal/GU negative Renal ROSHistory renal insufficiency but labs are normal  negative genitourinary   Musculoskeletal   Abdominal   Peds  Hematology negative hematology ROS (+) anemia , hgb 8.7   Anesthesia Other Findings Endometrial cancer  Reproductive/Obstetrics negative OB ROS                             Anesthesia Physical Anesthesia Plan  ASA: III  Anesthesia Plan: General   Post-op Pain Management:    Induction: Intravenous  Airway Management Planned: Oral ETT  Additional Equipment: Arterial line  Intra-op Plan:   Post-operative Plan: Extubation in OR  Informed Consent: I have reviewed the patients History and Physical, chart, labs and discussed the procedure including the risks, benefits and alternatives for the proposed anesthesia with the patient or authorized representative who has indicated his/her understanding and acceptance.   Dental Advisory Given  Plan Discussed with: CRNA and Surgeon  Anesthesia Plan Comments:         Anesthesia Quick Evaluation

## 2015-02-17 NOTE — Anesthesia Postprocedure Evaluation (Signed)
Anesthesia Post Note  Patient: Emily Zamora  Procedure(s) Performed: Procedure(s) (LRB): CRANIOTOMY HEMATOMA EVACUATION SUBDURAL (Left)  Patient location during evaluation: PACU Anesthesia Type: General Level of consciousness: awake and alert Pain management: pain level controlled Vital Signs Assessment: post-procedure vital signs reviewed and stable Respiratory status: spontaneous breathing, nonlabored ventilation, respiratory function stable and patient connected to nasal cannula oxygen Cardiovascular status: blood pressure returned to baseline and stable Postop Assessment: no signs of nausea or vomiting Anesthetic complications: no    Last Vitals:  Filed Vitals:   02/17/15 2100 02/17/15 2107  BP: 144/69   Pulse: 108 88  Temp:  36.6 C  Resp: 22 30    Last Pain:  Filed Vitals:   02/17/15 2111  PainSc: 0-No pain                 Effie Berkshire

## 2015-02-17 NOTE — H&P (Signed)
CC:  HA  HPI: Emily Zamora is a 60 y.o. female transferred from [facility], apparently presenting with HA and N/V for the last few days. She is somewhat confused and inattentive and not able to provide significant history. She apparently did not have significant other c/o weakness, N/T, or visual changes. Family do not know of any recent trauma or falls. They did notice that she has been very inattentive, taking a long time to answer simple questions, and generally not acting normally. They therefore brought her to the ED.  Of note, she has a history of endometrial CA currently on immunotherapy and a history of PE on coumadin.  PMH: Past Medical History  Diagnosis Date  . Cancer ([place])   . Primary cancer of endometrium ([place]) 05/24/2014  . Hypertension   . Knee pain, bilateral   . Arthritis   . Renal insufficiency     PSH: Past Surgical History  Procedure Laterality Date  . Gallbladder surgery  2014  . Abdominal hysterectomy      SH: Social History  Substance Use Topics  . Smoking status: Never Smoker   . Smokeless tobacco: None  . Alcohol Use: No    MEDS: Prior to Admission medications   Medication Sig Start Date End Date Taking? Authorizing Provider  atenolol (TENORMIN) 50 MG tablet Take 50 mg by mouth at bedtime.   Yes Historical Provider, MD  citalopram (CELEXA) 40 MG tablet Take 40 mg by mouth at bedtime.   Yes Historical Provider, MD  cyclobenzaprine (FLEXERIL) 5 MG tablet Take 1 tablet (5 mg total) by mouth 3 (three) times daily as needed for muscle spasms. 02/09/15  Yes Forest Gleason, MD  famotidine (PEPCID) 20 MG tablet Take 1 tablet (20 mg total) by mouth 2 (two) times daily. 09/02/14  Yes Ashok Norris, MD  fentaNYL (DURAGESIC - DOSED MCG/HR) 100 MCG/HR Place 1 patch (100 mcg total) onto the skin every 3 (three) days. 01/26/15  Yes Lloyd Huger, MD  gabapentin (NEURONTIN) 300 MG capsule  Take 1 capsule (300 mg total) by mouth 2 (two) times daily. 12/02/14  Yes Forest Gleason, MD  lidocaine-prilocaine (EMLA) cream Apply 1 application topically as needed (prior to accessing port).   Yes Historical Provider, MD  loratadine (CLARITIN) 10 MG tablet Take 1 tablet (10 mg total) by mouth daily. 09/02/14  Yes Ashok Norris, MD  mirtazapine (REMERON) 15 MG tablet Take 1 tablet (15 mg total) by mouth at bedtime. 02/09/15  Yes Forest Gleason, MD  Multiple Vitamin (MULTIVITAMIN WITH MINERALS) TABS tablet Take 1 tablet by mouth daily.   Yes Historical Provider, MD  ondansetron (ZOFRAN) 4 MG tablet Take 4 mg by mouth every 6 (six) hours as needed for nausea or vomiting.   Yes Historical Provider, MD  oxyCODONE (ROXICODONE) 15 MG immediate release tablet Take 2 tablets (30 mg total) by mouth every 2 (two) hours as needed for pain. 01/06/15  Yes Forest Gleason, MD  psyllium (HYDROCIL/METAMUCIL) 95 % PACK Take 1 packet by mouth 3 (three) times daily as needed for mild constipation.   Yes Historical Provider, MD  senna-docusate (SENOKOT-S) 8.6-50 MG tablet Take 1 tablet by mouth 2 (two) times daily.   Yes Historical Provider, MD  sucralfate (CARAFATE) 1 G tablet Take 1 tablet (1 g total) by mouth 4 (four) times daily - with meals and at bedtime. 11/12/14  Yes Noreene Filbert, MD  warfarin (COUMADIN) 5 MG tablet Take 5 mg by mouth every Monday, Wednesday, and Friday at 6  PM.   Yes Historical Provider, MD  warfarin (COUMADIN) 7.5 MG tablet Take 7.5 mg by mouth every evening. Pt takes on Tuesday, Thursday, Saturday, and Sunday.   Yes Historical Provider, MD    ALLERGY: Allergies  Allergen Reactions  . Sulfa Antibiotics Anaphylaxis    ROS: ROS  NEUROLOGIC EXAM: Awake, alert, oriented to person, hospital, date Is very inattentive Speech fluent CN grossly intact Motor exam: Upper Extremities Deltoid Bicep Tricep Grip   Right 5/5 5/5 5/5 5/5  Left 5/5 5/5 5/5 5/5   Lower Extremity IP Quad PF DF EHL  Right 5/5 5/5 5/5 5/5 5/5  Left 5/5 5/5 5/5 5/5 5/5   No drift Sensation grossly intact to LT  IMGAING: CTH reviewed demonstrated acute on chronic left holohemispheric SDH measuring 1.2cm in thickness. There is commensurate MLS measuring ~1.4cm. Also seen in crowding of the basal cisterns. No HCP.  LABS: PT 84 INR 11  IMPRESSION: - 60 y.o. female with large symptomatic left SDH with significant mass effect related to supratherapeutic INR  PLAN: - Will re-check INR, pt already received [facility] and Vit K in ED - Once INR normalized, will proceed with left craniotomy for evacuation of SDH     I have reviewed the CT findings with the patient and her sister [name]) and family friend. The treatment options were discussed including my recommendation for surgical evacuation given the degree of mass effect. Risks were discussed including recurrence of the hematoma, SZ, infection. All questions were answered and consent was obtained from her sister.

## 2015-02-17 NOTE — ED Notes (Signed)
Pts running meds transferred to  [facility]

## 2015-02-17 NOTE — Transfer of Care (Signed)
Immediate Anesthesia Transfer of Care Note  Patient: Emily Zamora  Procedure(s) Performed: Procedure(s): CRANIOTOMY HEMATOMA EVACUATION SUBDURAL (Left)  Patient Location: PACU  Anesthesia Type:General  Level of Consciousness: sedated  Airway & Oxygen Therapy: Patient Spontanous Breathing and Patient connected to face mask oxygen  Post-op Assessment: Report given to RN and Post -op Vital signs reviewed and stable  Post vital signs: Reviewed and stable  Last Vitals:  Filed Vitals:   02/17/15 1700  BP: 124/71  Pulse: 106  Temp: 36.8 C  Resp: 15    Complications: No apparent anesthesia complications

## 2015-02-18 ENCOUNTER — Encounter (HOSPITAL_COMMUNITY): Payer: Self-pay | Admitting: Neurosurgery

## 2015-02-18 ENCOUNTER — Inpatient Hospital Stay (HOSPITAL_COMMUNITY): Payer: BLUE CROSS/BLUE SHIELD

## 2015-02-18 DIAGNOSIS — E46 Unspecified protein-calorie malnutrition: Secondary | ICD-10-CM

## 2015-02-18 DIAGNOSIS — C7801 Secondary malignant neoplasm of right lung: Secondary | ICD-10-CM

## 2015-02-18 DIAGNOSIS — R4182 Altered mental status, unspecified: Secondary | ICD-10-CM

## 2015-02-18 DIAGNOSIS — C7951 Secondary malignant neoplasm of bone: Secondary | ICD-10-CM

## 2015-02-18 DIAGNOSIS — Z86711 Personal history of pulmonary embolism: Secondary | ICD-10-CM

## 2015-02-18 DIAGNOSIS — Z515 Encounter for palliative care: Secondary | ICD-10-CM

## 2015-02-18 DIAGNOSIS — D649 Anemia, unspecified: Secondary | ICD-10-CM

## 2015-02-18 DIAGNOSIS — D61818 Other pancytopenia: Secondary | ICD-10-CM

## 2015-02-18 DIAGNOSIS — C7802 Secondary malignant neoplasm of left lung: Secondary | ICD-10-CM

## 2015-02-18 DIAGNOSIS — C541 Malignant neoplasm of endometrium: Secondary | ICD-10-CM

## 2015-02-18 LAB — PREPARE RBC (CROSSMATCH)

## 2015-02-18 LAB — CBC WITH DIFFERENTIAL/PLATELET
BASOS ABS: 0 10*3/uL (ref 0.0–0.1)
BASOS PCT: 1 %
EOS ABS: 0.1 10*3/uL (ref 0.0–0.7)
EOS PCT: 2 %
HCT: 28.8 % — ABNORMAL LOW (ref 36.0–46.0)
Hemoglobin: 8.8 g/dL — ABNORMAL LOW (ref 12.0–15.0)
LYMPHS PCT: 13 %
Lymphs Abs: 0.8 10*3/uL (ref 0.7–4.0)
MCH: 25.7 pg — ABNORMAL LOW (ref 26.0–34.0)
MCHC: 30.6 g/dL (ref 30.0–36.0)
MCV: 84 fL (ref 78.0–100.0)
MONO ABS: 0.6 10*3/uL (ref 0.1–1.0)
Monocytes Relative: 11 %
Neutro Abs: 4.2 10*3/uL (ref 1.7–7.7)
Neutrophils Relative %: 74 %
Platelets: 41 10*3/uL — ABNORMAL LOW (ref 150–400)
RBC: 3.43 MIL/uL — ABNORMAL LOW (ref 3.87–5.11)
RDW: 17 % — AB (ref 11.5–15.5)
WBC: 5.6 10*3/uL (ref 4.0–10.5)

## 2015-02-18 LAB — PROTIME-INR
INR: 1.38 (ref 0.00–1.49)
INR: 1.34 (ref 0.00–1.49)
INR: 1.35 (ref 0.00–1.49)
PROTHROMBIN TIME: 17.1 s — AB (ref 11.6–15.2)
PROTHROMBIN TIME: 16.8 s — AB (ref 11.6–15.2)
Prothrombin Time: 16.7 seconds — ABNORMAL HIGH (ref 11.6–15.2)

## 2015-02-18 LAB — MRSA PCR SCREENING: MRSA by PCR: NEGATIVE

## 2015-02-18 MED ORDER — CETYLPYRIDINIUM CHLORIDE 0.05 % MT LIQD
7.0000 mL | Freq: Two times a day (BID) | OROMUCOSAL | Status: DC
  Administered 2015-02-18 (×2): 7 mL via OROMUCOSAL

## 2015-02-18 MED ORDER — SODIUM CHLORIDE 0.9 % IV SOLN: Freq: Once | INTRAVENOUS | Status: DC

## 2015-02-18 NOTE — Progress Notes (Signed)
SLP Cancellation Note  Patient Details Name: Emily Zamora MRN: [id number] DOB: 1955-07-30   Cancelled treatment:       Reason Eval/Treat Not Completed: Medical issues which prohibited therapy (Per RN, not appropriate for evaluation today. Will f/u 2.16.)  Gabriel Rainwater Guthrie, CCC-SLP [phone]  Gabriel Rainwater Meryl 02/18/2015, 9:54 AM

## 2015-02-18 NOTE — Progress Notes (Signed)
Dr. Kathyrn Sheriff notified of continuous bleeding from incision site, requiring more pad changes; still minimal to no output from JP drain. Patient is becoming more restless. RN will continue to monitor patient closely.

## 2015-02-18 NOTE — Progress Notes (Signed)
Dr. Kathyrn Sheriff notified of followup head CT results. Patient is awake but still not answering questions appropriately and is inconsistently following commands. Patient moving all extremities. Dr. Kathyrn Sheriff also made aware of patient's bleeding around the surgical incision site and minimal output from JP drain. Patient's HR has been in 120's and occasionally up to the 130's. Orders received to continue to administer PRN labetolol as long as pressures will hold. RN will continue to monitor patient closely.

## 2015-02-18 NOTE — Progress Notes (Signed)
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 female is minimally responsive. I'm not able to obtain history from the patient I collaborated the history with his primary oncologist in [place] and his sister in multiple family members as well as review of electronic records This patient is diagnosed with metastatic endometrial cancer to multiple places including the lungs and bone and was noted to be refractory to multiple lines of treatment. Her most recent treatment is with Nivolumab The patient was also diagnosed with PE year ago. She was originally on Xarelto but due to cost issue was placed on warfarin According to her sister, the patient had poor oral intake and severe nausea & vomiting after recent chemotherapy The patient was brought to the emergency room and was found to have INR greater than 11, confusion, complaints of severe headache and nausea CT scan showed large subdural hematoma She underwent emergency surgery after reversal of INR Unfortunately, CT scan today show persistent hematoma and altered mental status She received blood transfusion and platelet transfusion INR remained less than 1.5 CBC this morning show pancytopenia There is no other reported signs or symptoms of bleeding such as spontaneous epistaxis, hematuria or hematochezia.   MEDICAL HISTORY:  Past Medical History  Diagnosis Date  . Cancer ([place])   . Primary cancer of endometrium ([place]) 05/24/2014  . Hypertension   . Knee pain, bilateral   . Arthritis   . Renal insufficiency     SURGICAL HISTORY: Past Surgical History  Procedure Laterality Date  . Gallbladder surgery  2014  . Abdominal hysterectomy    .  Craniotomy Left 02/17/2015    Procedure: CRANIOTOMY HEMATOMA EVACUATION SUBDURAL;  Surgeon: Consuella Lose, MD;  Location: [facility];  Service: Neurosurgery;  Laterality: Left;    SOCIAL HISTORY: Social History   Social History  . Marital Status: Single    Spouse Name: N/A  . Number of Children: N/A  . Years of Education: N/A   Occupational History  . Not on file.   Social History Main Topics  . Smoking status: Never Smoker   . Smokeless tobacco: Not on file  . Alcohol Use: No  . Drug Use: No  . Sexual Activity: Not on file   Other Topics Concern  . Not on file   Social History Narrative    FAMILY HISTORY: Family History  Problem Relation Age of Onset  . Breast cancer Sister 23  . Hypertension Sister   . Diabetes Mother   . Hypertension Mother     ALLERGIES:  is allergic to sulfa antibiotics.  MEDICATIONS:  Current Facility-Administered Medications  Medication Dose Route Frequency Provider Last Rate Last Dose  . 0.9 %  sodium chloride infusion   Intravenous Once Consuella Lose, MD      . acetaminophen (TYLENOL) tablet 650 mg  650 mg Oral Q4H PRN Consuella Lose, MD       Or  . acetaminophen (TYLENOL) suppository 650 mg  650 mg Rectal Q4H PRN Consuella Lose, MD      . antiseptic oral rinse (CPC / CETYLPYRIDINIUM CHLORIDE 0.05%) solution 7 mL  7 mL Mouth Rinse BID Consuella Lose, MD   7 mL at 02/18/15 1047  . atenolol (TENORMIN) tablet 50 mg  50 mg  Oral QHS Consuella Lose, MD   50 mg at 02/17/15 2200  . citalopram (CELEXA) tablet 40 mg  40 mg Oral QHS Consuella Lose, MD   40 mg at 02/17/15 2200  . cyclobenzaprine (FLEXERIL) tablet 5 mg  5 mg Oral TID PRN Consuella Lose, MD      . famotidine (PEPCID) tablet 20 mg  20 mg Oral BID Consuella Lose, MD   20 mg at 02/17/15 2200  . fentaNYL (DURAGESIC - dosed mcg/hr) 100 mcg  100 mcg Transdermal Q72H Consuella Lose, MD   100 mcg at 02/17/15 2344  . gabapentin (NEURONTIN) capsule 300 mg  300 mg  Oral BID Consuella Lose, MD   300 mg at 02/17/15 2200  . labetalol (NORMODYNE,TRANDATE) injection 10-40 mg  10-40 mg Intravenous Q10 min PRN Consuella Lose, MD   20 mg at 02/18/15 0320  . levETIRAcetam (KEPPRA) 500 mg in sodium chloride 0.9 % 100 mL IVPB  500 mg Intravenous Q12H Consuella Lose, MD   500 mg at 02/18/15 1059  . lidocaine-prilocaine (EMLA) cream 1 application  1 application Topical PRN Consuella Lose, MD      . loratadine (CLARITIN) tablet 10 mg  10 mg Oral Daily Consuella Lose, MD   10 mg at 02/18/15 1000  . mirtazapine (REMERON) tablet 15 mg  15 mg Oral QHS Consuella Lose, MD   15 mg at 02/17/15 2200  . morphine 2 MG/ML injection 1-4 mg  1-4 mg Intravenous Q1H PRN Consuella Lose, MD   1 mg at 02/18/15 0112  . multivitamin with minerals tablet 1 tablet  1 tablet Oral Daily Consuella Lose, MD   1 tablet at 02/18/15 1000  . ondansetron (ZOFRAN) tablet 4 mg  4 mg Oral Q6H PRN Consuella Lose, MD      . oxyCODONE (Oxy IR/ROXICODONE) immediate release tablet 30 mg  30 mg Oral Q4H PRN Consuella Lose, MD      . pantoprazole (PROTONIX) injection 40 mg  40 mg Intravenous QHS Consuella Lose, MD   40 mg at 02/17/15 2344  . psyllium (HYDROCIL/METAMUCIL) packet 1 packet  1 packet Oral TID PRN Consuella Lose, MD      . senna-docusate (Senokot-S) tablet 1 tablet  1 tablet Oral BID Consuella Lose, MD   1 tablet at 02/17/15 2200  . sucralfate (CARAFATE) tablet 1 g  1 g Oral TID WC & HS Consuella Lose, MD   1 g at 02/17/15 2200   Facility-Administered Medications Ordered in Other Encounters  Medication Dose Route Frequency Provider Last Rate Last Dose  . 0.9 %  sodium chloride infusion   Intravenous Once Forest Gleason, MD      . heparin lock flush 100 unit/mL  500 Units Intravenous Once Forest Gleason, MD      . heparin lock flush 100 unit/mL  500 Units Intravenous Once Forest Gleason, MD      . sodium chloride 0.9 % injection 10 mL  10 mL Intracatheter PRN Forest Gleason, MD   10 mL at 11/10/14 0837  . sodium chloride 0.9 % injection 10 mL  10 mL Intravenous PRN Forest Gleason, MD   10 mL at 12/08/14 0834  . sodium chloride 0.9 % injection 10 mL  10 mL Intravenous PRN Forest Gleason, MD   10 mL at 12/15/14 1417    REVIEW OF SYSTEMS:  Unable to obtain  PHYSICAL EXAMINATION: ECOG PERFORMANCE STATUS: 3 - Symptomatic, >50% confined to bed  Filed Vitals:   02/18/15 1300 02/18/15 1400  BP:  154/84 131/83  Pulse: 127 86  Temp:    Resp: 27 21   Filed Weights   02/17/15 1700  Weight: 145 lb 15.1 oz (66.2 kg)    GENERAL:alert, no distress and comfortable. She is not conscious but appeared to be arousable SKIN: skin coloris pale, texture, turgor are normal, no rashes or significant lesions EYES: normal, conjunctiva are pale and non-injected, sclera clear OROPHARYNX:no exudate, no erythema and lips, buccal mucosa, and tongue normal . No thrush NECK: supple, thyroid normal size, non-tender, without nodularity LYMPH:  no palpable lymphadenopathy in the cervical, axillary or inguinal LUNGS: clear to auscultation and percussion with normal breathing effort HEART: regular rate & rhythm and no murmurs and no lower extremity edema ABDOMEN:abdomen soft, non-tender and normal bowel sounds Musculoskeletal:no cyanosis of digits and no clubbing  PSYCH: not alert NEURO: she appears to be moving all her limbs but she is on restrains LABORATORY DATA:  I have reviewed the data as listed Lab Results  Component Value Date   WBC 4.7 02/18/2015   HGB 6.4* 02/18/2015   HCT 21.1* 02/18/2015   MCV 85.4 02/18/2015   PLT 45* 02/18/2015    Recent Labs  01/26/15 0856 02/09/15 1403 02/17/15 1337  NA 130* 128* 138  K 3.9 4.1 3.8  CL 97* 98* 103  CO2 24 21* 25  GLUCOSE 143* 152* 138*  BUN 20 24* 25*  CREATININE 0.87 0.87 0.79  CALCIUM 8.3* 8.4* 8.5*  GFRNONAA >60 >60 >60  GFRAA >60 >60 >60  PROT 6.6 6.7 6.9  ALBUMIN 3.3* 3.3* 3.2*  AST 28 43* 44*  ALT 14 19 17    ALKPHOS 192* 264* 272*  BILITOT 0.7 1.0 1.3*    RADIOGRAPHIC STUDIES: I have personally reviewed the radiological images as listed and agreed with the findings in the report. Ct Head Wo Contrast  02/18/2015  CLINICAL DATA:  Follow-up craniotomy.  Subdural hematoma. EXAM: CT HEAD WITHOUT CONTRAST TECHNIQUE: Contiguous axial images were obtained from the base of the skull through the vertex without intravenous contrast. COMPARISON:  02/17/2015 FINDINGS: Postoperative changes with left parietal craniotomy. Skin clips and subcutaneous emphysema consistent with recent surgery. Placement of left subdural drain. There is persistent acute subdural hematoma over the left frontoparietal region. Maximal depth measures about 1.7 cm. There is associated mass effect with sulcal and ventricular effacement and with left-to-right midline shift of about 16 mm. Midline shift is increased since previous study. Probable sub falcine herniation. Small amount of hemorrhage layering along the left side of the falx. This is similar to previous study. Subdural gas is consistent with postoperative change. Basal cisterns are not effaced. Gray-white matter junctions remain distinct. Visualized paranasal sinuses and mastoid air cells are not opacified. IMPRESSION: Postoperative left parietal craniotomy. Persistent acute subdural hemorrhage on the left demonstrates increasing depth of 17 mm since previous study and increasing left-to-right midline shift of 16 mm since previous study. Subdural drainage catheter in place. These results were called by telephone at the time of interpretation on [id number] at [id number] am to [facility], the patient's nurse, who verbally acknowledged these results. Electronically Signed   By: Lucienne Capers M.D.   On: 02/18/2015 02:58   Ct Head Wo Contrast  02/17/2015  CLINICAL DATA:  Endometrial cancer with osseous metastatic disease, new onset headache for 3 days, intermittent vomiting for 2 days, weakness, renal  insufficiency, hypertension, cirrhosis, initial encounter EXAM: CT HEAD WITHOUT CONTRAST TECHNIQUE: Contiguous axial images were obtained from the base of the skull through the  vertex without intravenous contrast. COMPARISON:  None FINDINGS: Mixed high and low attenuation subdural collection along the LEFT hemisphere from frontal to parietal region consistent with subdural hematoma, likely acute on subacute/chronic. Hematoma measures up to 12 mm thick. Significant mass effect upon LEFT hemisphere with 12 mm of LEFT-to-RIGHT midline shift and suspected subfalcine herniation. Minimal dilatation of the atrium and temporal horn of the RIGHT lateral ventricle which could reflect entrapment. Extension of subdural hematoma to the falx. No intraparenchymal hemorrhage, mass or acute infarct. Visualized paranasal sinuses and mastoid air cells clear. Skull intact. Lytic focus RIGHT occipital bone could be related to a prominent arachnoid granulation but a lytic metastasis is not entirely excluded. IMPRESSION: Large LEFT subdural hematoma, acute on subacute/chronic, 12 mm thick extending onto the falx. Associated mass effect upon RIGHT hemisphere with 12 mm of RIGHT to LEFT midline shift and mild subfalcine herniation. No intraparenchymal brain abnormalities identified. Findings called to Dr. Corky Downs on 02/17/2015 at 1430 hours. Electronically Signed   By: Lavonia Dana M.D.   On: 02/17/2015 14:31   Mr Thoracic Spine W Wo Contrast  02/04/2015  CLINICAL DATA:  History of endometrial cancer. EXAM: MRI THORACIC SPINE WITHOUT AND WITH CONTRAST TECHNIQUE: Multiplanar and multiecho pulse sequences of the thoracic spine were obtained without and with intravenous contrast. CONTRAST:  8mL MULTIHANCE GADOBENATE DIMEGLUMINE 529 MG/ML IV SOLN COMPARISON:  None. FINDINGS: There is a T11 vertebral body compression fracture with approximately 50% height loss and enhancement within the vertebral body. There is 5 mm of retropulsion of the  inferior posterior margin of the T11 vertebral body impressing on the central canal and resulting in mild central canal stenosis. There is abnormal T1 hypointense marrow signal within the cervical spine vertebral bodies on the scout image. There is abnormal T1 hypointense marrow signal within the T1, T2, T3, T4, T5, T6 vertebral bodies. The alignment is anatomic. The thoracic spinal cord is normal in size and signal. The disc spaces are maintained. Partially visualized is left supraclavicular and infraclavicular lymphadenopathy. The largest infraclavicular lymph node measures 17 mm in short axis. C6-7:  Mild broad-based disc bulge. T1-T2: Mild broad-based disc bulge. No foraminal or central canal stenosis. T2-T3: Left paracentral/ foraminal disc protrusion with left foraminal stenosis. No right foraminal stenosis. No central canal stenosis. T3-T4: No disc protrusion, foraminal stenosis or central canal stenosis. T4-T5: No disc protrusion, foraminal stenosis or central canal stenosis. T5-T6: No disc protrusion, foraminal stenosis or central canal stenosis. T6-T7: No disc protrusion, foraminal stenosis or central canal stenosis. T7-T8: Shallow left paracentral disc protrusion. No foraminal or central canal stenosis. T8-T9: No disc protrusion, foraminal stenosis or central canal stenosis. T9-T10: Tiny right paracentral disc protrusion. No foraminal or central canal stenosis. T10-T11: No disc protrusion, foraminal stenosis or central canal stenosis. T11-T12: No disc protrusion, foraminal stenosis or central canal stenosis. IMPRESSION: 1. Chronic T11 vertebral body compression fracture with mild postcontrast enhancement likely reflecting a pathologic fracture. 2. Cervicothoracic marrow abnormality most concerning for osseous metastatic disease. 3. Partially visualized is left supraclavicular and infraclavicular lymphadenopathy. Electronically Signed   By: Kathreen Devoid   On: 02/04/2015 13:09    ASSESSMENT & PLAN:   Metastatic endometrial cancer, with metastatic disease to the lungs and bone with possible lymph notes involvement The sister is aware of poor prognosis With this recent event, it is unlikely the patient can tolerate further chemotherapy The patient and family members had made informed decision to transition to comfort care tomorrow She requested aggressive transfusion support  so that the patient can be kept alive until her sons would arrive from [place] to say goodbye to the patient They agree for palliative care consult tomorrow  Subdural hematoma The sister is made aware of prognosis and decided against further surgery I recommend further platelet transfusion to keep platelet count close to 100,000 I have ordered 2 more units of platelets to be given today  Anemia/pancytopenia This is likely due to bone marrow infiltration Continue transfusion support to keep hemoglobin greater than 8 and platelet count at least 50,000, preferably close to 100,000 per family request  History of PE Recent supratherapeutic INR, resolved No further anticoagulation due to problems above Continue close monitoring to keep INR less than 1.5  Protein calorie malnutrition This likely contributed to supratherapeutic INR In the scope of things, observe only  Discharge planning Per discussion with the patient's family and her primary oncologist, decision is made to eventually transition her to comfort care only, after family members arrive tomorrow I will sign off  All questions were answered. The patient knows to call the clinic with any problems, questions or concerns. [facility], [place], MD 02/18/2015 2:27 PM

## 2015-02-18 NOTE — Progress Notes (Signed)
Pt seen and examined. Has had increased bleeding from drain and incision overnight.   EXAM: Temp:  [97.6 F (36.4 C)-99.1 F (37.3 C)] 99.1 F (37.3 C) (02/15 0915) Pulse Rate:  [83-134] 131 (02/15 0915) Resp:  [12-33] 23 (02/15 0915) BP: (114-153)/(52-106) 127/91 mmHg (02/15 0900) SpO2:  [95 %-100 %] 99 % (02/15 0915) Arterial Line BP: (91-169)/(66-99) 91/83 mmHg (02/15 0915) Weight:  [66.2 kg (145 lb 15.1 oz)-68.493 kg (151 lb)] 66.2 kg (145 lb 15.1 oz) (02/14 1700) Intake/Output      02/14 0701 - 02/15 0700 02/15 0701 - 02/16 0700   I.V. (mL/kg) 1500 (22.7) 500 (7.6)   Blood  326.7   IV Piggyback 100    Total Intake(mL/kg) 1600 (24.2) 826.7 (12.5)   Urine (mL/kg/hr) 890    Blood 100    Total Output 990     Net +610 +826.7          Occasionally opens eyes to voice Non-verbal Pupils asymmetric, left 4-65mm reactive, right 3-81mm reactive Moves purposefully all extremities, ??following commands Wound slowly oozing blood  LABS: Lab Results  Component Value Date   CREATININE 0.79 02/17/2015   BUN 25* 02/17/2015   NA 138 02/17/2015   K 3.8 02/17/2015   CL 103 02/17/2015   CO2 25 02/17/2015   Lab Results  Component Value Date   WBC 4.7 02/18/2015   HGB 6.4* 02/18/2015   HCT 21.1* 02/18/2015   MCV 85.4 02/18/2015   PLT 45* 02/18/2015    IMAGING: CTH this am demonstrates re accumulation of now acute left primarily frontal SDH with increase in MLS to approximately 1.7cm. There continues to be effacement of the basal cisterns. No HCP.  IMPRESSION: - 60 y.o. female with clinical coagulopathy and now anemia and thrombocytopenia with reaccumulation of left SDH. Given her coagulopathy and thrombocytopenia I do not feel re-evacuation is feasible. Unfortunately, given her overall clinical picture with metastatic endometrial CA which has failed standard treatment, this is likely a terminal event.  PLAN: - Will transfuse PRBC and platelets - Cont supportive care - I have  discussed the situation with Hematologist on call who will see the patient for the coagulopathy. At this point our only option is continued transfusion. - Pts HCPOA is going to discuss the situation and prognosis with the pts sons but we will likely change code status to DNR/DNI.  I have discussed the imaging findings and the likely poor prognosis with the patient's sister and mother. The above plan was discussed. All questions were answered.

## 2015-02-18 NOTE — Progress Notes (Signed)
Palliative consult received and notes reviewed- family arriving this evening. Palliative team will have provider available to see this family in AM.  Lane Hacker, [place]

## 2015-02-18 NOTE — Transfer of Care (Signed)
Immediate Anesthesia Transfer of Care Note  Patient: Emily Zamora  Procedure(s) Performed: Procedure(s): CRANIOTOMY HEMATOMA EVACUATION SUBDURAL (Left)  Patient Location: PACU  Anesthesia Type:General  Level of Consciousness: sedated  Airway & Oxygen Therapy: Patient Spontanous Breathing and Patient connected to face mask oxygen  Post-op Assessment: Report given to RN and Post -op Vital signs reviewed and stable  Post vital signs: Reviewed and stable  Last Vitals:  Filed Vitals:   02/18/15 0100 02/18/15 0200  BP: 147/77 118/81  Pulse: 120 101  Temp:    Resp: 18 21    Complications: No apparent anesthesia complications

## 2015-02-18 NOTE — Progress Notes (Signed)
PT Cancellation Note  Patient Details Name: Emily Zamora MRN: [id number] DOB: 06-Dec-1955   Cancelled Treatment:    Reason Eval/Treat Not Completed: Patient not medically ready.  Per conversation w/ RN hematoma has increased in size w/ greater midline shift.  Additionally, Hgb 6.4.  Will hold PT for now and will continue to follow acutely.  Pt currently on bedrest, will need updated activity orders, once appropriate, for PT evaluation to be completed.  Thank you for this order.  Joslyn Hy PT, DPT [phone] Pager: [phone] 02/18/2015, 9:21 AM

## 2015-02-18 NOTE — Progress Notes (Signed)
CRITICAL VALUE ALERT  Critical value received: hgb 6.5, platelets 45  Date of notification:  02/18/15  Time of notification:  0628  Critical value read back: yes  Nurse who received alert:  Alyce Natelie Ostrosky  MD notified (1st page):  Nundkumar  Time of first page:  0630  MD notified (2nd page):  Time of second page:  Responding MD:  Kathyrn Sheriff  Time MD responded:  [phone]  Orders received to transfuse 2 units of RBCs and 6 packs of platelets. RN will continue to monitor patient closely.

## 2015-02-18 NOTE — Progress Notes (Signed)
Dr Kathyrn Sheriff had initial DNR/DNI discussion with patients mother and sister (POA) this morning. They stated they wanted to talk to the patients sons who live in [place]. Dr Kathyrn Sheriff stated that was fine and it was appropriate to let the patients RN know of decision. Santiago Glad [facility]) stated she talked to sons and everyone was in agreement to make the patient DNR/DNI. The sons will be flying in tonight in the meantime. Dr Kathyrn Sheriff made aware of decision via telephone conversation with myself. Order placed in the computers.

## 2015-02-18 NOTE — Clinical Documentation Improvement (Addendum)
Neuro Surgery  (Query responses must be documented in the current medical record, not on the CDI BPA form.)  Query 1 of 2    (please scroll down) Please document if a condition below provides greater specificity regarding the patient's "altered mental status":  Possible Clinical Conditions:  - Acute Encephalopathy, including type, and any associated cause(s) and/or condition(s)  - Cerebral Edema, secondary to enlarging subdural hematoma  - Brain Herniation, including the specific area and/or type  Clinical Information: "Unfortunately, CT scan today show persistent hematoma and altered mental status" is documented by Dr. Kathyrn Sheriff 02/18/15 Nursing Notes include documentation that the patient was becoming more restless "She is not conscious but appeared to be arousable" is documented by Dr. Alvy Bimler 02/18/15  NEURO: she appears to be moving all her limbs but she is on restrains" is documented by Dr. Alvy Bimler 02/18/15   CT Head from [facility] 02/16/13 Large LEFT subdural hematoma, acute on subacute/chronic, 12 mm thick extending onto the falx. Associated mass effect upon RIGHT hemisphere with 12 mm of RIGHT to LEFT midline shift and mild subfalcine herniation. No intraparenchymal brain abnormalities identified.  Postoperative CT at [facility] 02/18/15 Postoperative left parietal craniotomy. Persistent acute subdural hemorrhage on the left demonstrates increasing depth of 17 mm since previous study and increasing left-to-right midline shift of 16 mm since previous study. Subdural drainage catheter in place.  Query 2 of 2     Please document if a condition below provides greater specificity regarding the patient's "anemia" that has required blood transfusions:   Possible Clinical Conditions:  - Acute Blood Loss Anemia  - Other type of anemia  - Unable to clinically determine  Please exercise your independent, professional judgment when responding. A specific answer is not anticipated or  expected.   Thank You, Erling Conte  RN BSN CCDS [phone] Health Information Management [place]

## 2015-02-18 NOTE — Care Management Note (Signed)
Case Management Note  Patient Details  Name: Emily Zamora MRN: [id number] Date of Birth: April 02, 1955  Subjective/Objective:     Pt admitted on 02/17/15 with SDH with significant mass effect.  PTA, pt independent; she has supportive family.  Sister and mother at bedside; 3 sons in [place] plan to fly in later this evening.                 Action/Plan: Palliative care provider to see family in AM.  Will follow/offer support as needed.    Expected Discharge Date:                  Expected Discharge Plan:     In-House Referral:     Discharge planning Services   CM referral  Post Acute Care Choice:    Choice offered to:     DME Arranged:    DME Agency:     HH Arranged:    HH Agency:     Status of Service:   Will continue to follow  Medicare Important Message Given:    Date Medicare IM Given:    Medicare IM give by:    Date Additional Medicare IM Given:    Additional Medicare Important Message give by:     If discussed at [place] of Stay Meetings, dates discussed:    Additional Comments:  Reinaldo Raddle, RN, BSN  Trauma/Neuro ICU Case Manager [phone]

## 2015-02-19 DIAGNOSIS — Z789 Other specified health status: Secondary | ICD-10-CM

## 2015-02-19 LAB — TYPE AND SCREEN
ABO/RH(D): O POS
ANTIBODY SCREEN: NEGATIVE
UNIT DIVISION: 0
Unit division: 0

## 2015-02-19 LAB — BASIC METABOLIC PANEL
Anion gap: 13 (ref 5–15)
BUN: 15 mg/dL (ref 6–20)
CO2: 22 mmol/L (ref 22–32)
CREATININE: 0.81 mg/dL (ref 0.44–1.00)
Calcium: 7.4 mg/dL — ABNORMAL LOW (ref 8.9–10.3)
Chloride: 110 mmol/L (ref 101–111)
Glucose, Bld: 134 mg/dL — ABNORMAL HIGH (ref 65–99)
POTASSIUM: 3.6 mmol/L (ref 3.5–5.1)
SODIUM: 145 mmol/L (ref 135–145)

## 2015-02-19 LAB — CBC WITH DIFFERENTIAL/PLATELET
BASOS PCT: 0 %
Basophils Absolute: 0 10*3/uL (ref 0.0–0.1)
EOS ABS: 0.1 10*3/uL (ref 0.0–0.7)
EOS PCT: 2 %
HCT: 26.1 % — ABNORMAL LOW (ref 36.0–46.0)
Hemoglobin: 8.4 g/dL — ABNORMAL LOW (ref 12.0–15.0)
LYMPHS ABS: 0.9 10*3/uL (ref 0.7–4.0)
Lymphocytes Relative: 15 %
MCH: 26.8 pg (ref 26.0–34.0)
MCHC: 32.2 g/dL (ref 30.0–36.0)
MCV: 83.4 fL (ref 78.0–100.0)
Monocytes Absolute: 0.6 10*3/uL (ref 0.1–1.0)
Monocytes Relative: 10 %
Neutro Abs: 4.3 10*3/uL (ref 1.7–7.7)
Neutrophils Relative %: 73 %
PLATELETS: 76 10*3/uL — AB (ref 150–400)
RBC: 3.13 MIL/uL — AB (ref 3.87–5.11)
RDW: 17.8 % — ABNORMAL HIGH (ref 11.5–15.5)
WBC: 6 10*3/uL (ref 4.0–10.5)

## 2015-02-19 LAB — PREPARE PLATELET PHERESIS
UNIT DIVISION: 0
UNIT DIVISION: 0
Unit division: 0

## 2015-02-19 LAB — CBC
HCT: 21.1 % — ABNORMAL LOW (ref 36.0–46.0)
HEMOGLOBIN: 6.4 g/dL — AB (ref 12.0–15.0)
MCH: 25.9 pg — ABNORMAL LOW (ref 26.0–34.0)
MCHC: 30.3 g/dL (ref 30.0–36.0)
MCV: 85.4 fL (ref 78.0–100.0)
PLATELETS: 45 10*3/uL — AB (ref 150–400)
RBC: 2.47 MIL/uL — AB (ref 3.87–5.11)
RDW: 17.1 % — ABNORMAL HIGH (ref 11.5–15.5)
WBC: 4.7 10*3/uL (ref 4.0–10.5)

## 2015-02-19 LAB — PROTIME-INR
INR: 1.51 — AB (ref 0.00–1.49)
PROTHROMBIN TIME: 18.3 s — AB (ref 11.6–15.2)

## 2015-02-19 MED ORDER — HALOPERIDOL LACTATE 5 MG/ML IJ SOLN: 0.5000 mg | INTRAMUSCULAR | Status: DC | PRN

## 2015-02-19 MED ORDER — ONDANSETRON 4 MG PO TBDP
4.0000 mg | ORAL_TABLET | Freq: Four times a day (QID) | ORAL | Status: DC | PRN
  Filled 2015-02-19: qty 1

## 2015-02-19 MED ORDER — SODIUM CHLORIDE 0.9 % IV SOLN: INTRAVENOUS | Status: DC

## 2015-02-19 MED ORDER — LORAZEPAM 2 MG/ML IJ SOLN
1.0000 mg | Freq: Two times a day (BID) | INTRAMUSCULAR | Status: DC
  Administered 2015-02-19: 1 mg via INTRAVENOUS
  Filled 2015-02-19: qty 1

## 2015-02-19 MED ORDER — LORAZEPAM 2 MG/ML IJ SOLN: 1.0000 mg | Freq: Two times a day (BID) | INTRAMUSCULAR | Status: AC

## 2015-02-19 MED ORDER — ACETAMINOPHEN 650 MG RE SUPP: 650.0000 mg | Freq: Four times a day (QID) | RECTAL | Status: DC | PRN

## 2015-02-19 MED ORDER — ACETAMINOPHEN 325 MG PO TABS: 650.0000 mg | ORAL_TABLET | Freq: Four times a day (QID) | ORAL | Status: DC | PRN

## 2015-02-19 MED ORDER — GLYCOPYRROLATE 0.2 MG/ML IJ SOLN
0.2000 mg | INTRAMUSCULAR | Status: DC | PRN
Start: 2015-02-19 — End: 2015-02-19

## 2015-02-19 MED ORDER — HALOPERIDOL LACTATE 2 MG/ML PO CONC
0.5000 mg | ORAL | Status: DC | PRN
  Filled 2015-02-19: qty 0.3

## 2015-02-19 MED ORDER — HALOPERIDOL 0.5 MG PO TABS
0.5000 mg | ORAL_TABLET | ORAL | Status: DC | PRN
  Filled 2015-02-19: qty 1

## 2015-02-19 MED ORDER — BIOTENE DRY MOUTH MT LIQD: 15.0000 mL | OROMUCOSAL | Status: DC | PRN

## 2015-02-19 MED ORDER — POLYVINYL ALCOHOL 1.4 % OP SOLN
1.0000 [drp] | Freq: Four times a day (QID) | OPHTHALMIC | Status: DC | PRN
  Filled 2015-02-19: qty 15

## 2015-02-19 MED ORDER — MORPHINE SULFATE (PF) 4 MG/ML IV SOLN: 4.0000 mg | INTRAVENOUS | Status: AC | PRN

## 2015-02-19 MED ORDER — ONDANSETRON HCL 4 MG/2ML IJ SOLN: 4.0000 mg | Freq: Four times a day (QID) | INTRAMUSCULAR | Status: DC | PRN

## 2015-02-19 MED ORDER — BISACODYL 10 MG RE SUPP: 10.0000 mg | Freq: Every day | RECTAL | Status: DC | PRN

## 2015-02-19 MED ORDER — SODIUM CHLORIDE 0.9 % IV SOLN: 500.0000 mg | Freq: Two times a day (BID) | INTRAVENOUS | Status: AC

## 2015-02-19 MED ORDER — GLYCOPYRROLATE 0.2 MG/ML IJ SOLN: 0.2000 mg | INTRAMUSCULAR | Status: DC | PRN

## 2015-02-19 MED ORDER — GLYCOPYRROLATE 1 MG PO TABS
1.0000 mg | ORAL_TABLET | ORAL | Status: DC | PRN
  Filled 2015-02-19: qty 1

## 2015-02-19 MED ORDER — MORPHINE SULFATE (PF) 4 MG/ML IV SOLN
4.0000 mg | INTRAVENOUS | Status: DC | PRN
  Administered 2015-02-19: 4 mg via INTRAVENOUS
  Filled 2015-02-19: qty 1

## 2015-02-19 NOTE — Progress Notes (Signed)
SLP Cancellation Note  Patient Details Name: Emily Zamora MRN: [id number] DOB: 06-05-1955   Cancelled treatment:       Reason Eval/Treat Not Completed: Medical issues which prohibited therapy. Will sign off at this time.    Edmond Ginsberg, Katherene Ponto 02/19/2015, 7:29 AM

## 2015-02-19 NOTE — Progress Notes (Signed)
No issues overnight. Pts sons are in from [place]. No clinical changes.  EXAM:  BP 126/86 mmHg  Pulse 120  Temp(Src) 99.2 F (37.3 C) (Oral)  Resp 33  Ht 5\' 4"  (1.626 m)  Wt 66.2 kg (145 lb 15.1 oz)  BMI 25.04 kg/m2  SpO2 94%  Opens eyes to pain Groans to pain Pupils asymmetric but reactive Withdraws to pain  IMPRESSION:  60 y.o. female with metastatic endometrial CA, coagulopathy, and recurrent left SDH. We have made her DNR/DNI and transitioned to comfort care.  PLAN: - Will transfer to inpatient hospice in [place] for SZ prophylaxis

## 2015-02-19 NOTE — Progress Notes (Signed)
PT Cancellation and Discharge Note  Patient Details Name: MAYURI SHENTON MRN: [id number] DOB: 1955-11-22   Cancelled Treatment:    Reason Eval/Treat Not Completed: Patient not medically ready.  Spoke with RN who indicates plan is for [facility] Hospice at this time.  Will sign off.     Catarina Hartshorn, [place] 02/19/2015, 8:57 AM

## 2015-02-19 NOTE — Clinical Social Work Note (Addendum)
Call received from palliative care regarding needs for referral to [facility]. Referral has been made to facility, waiting for response.   UPDATE: CSW received confirmation from [place] with [facility] that the facility can admit the patient today. Family has completed paperwork with Marcene Brawn and are in agreement with plan to transfer to [facility]. CSW spoke with sister Gildardo Pounds to facilitate discharge. DC Summary faxed and received by facility. Per MD patient ready to [place]. RN, patient/family, and facility notified of patient's DC. RN given number for report. DC packet on patient's chart. Ambulance transport requested for patient. CSW signing off at this time.   Liz Beach MSW, [place], [place], [id number]

## 2015-02-19 NOTE — Care Management Note (Signed)
Case Management Note  Patient Details  Name: Emily Zamora MRN: [id number] Date of Birth: 05/19/1955  Subjective/Objective:  Palliative meeting earlier today.  Family desires [facility].                    Action/Plan: Plan dc to [facility] today, per CSW arrangements.   Expected Discharge Date:     02/19/15             Expected Discharge Plan:  [place]  In-House Referral:  Clinical Social Work  Discharge planning Services  CM Consult  Post Acute Care Choice:    Choice offered to:     DME Arranged:    DME Agency:     HH Arranged:    [place] Agency:     Status of Service:  Completed, signed off  Medicare Important Message Given:    Date Medicare IM Given:    Medicare IM give by:    Date Additional Medicare IM Given:    Additional Medicare Important Message give by:     If discussed at [place] of Stay Meetings, dates discussed:    Additional Comments:  Reinaldo Raddle, RN, BSN  Trauma/Neuro ICU Case Manager [phone]

## 2015-02-19 NOTE — Progress Notes (Signed)
PTAR arrived to transport patient to [facility]. Family at bedside. Patient's peripheral IV's d/c'ed. Portacath is saline locked. All discharge paperwork given to PTAR. Patient's foley remains in place for end of life comfort care. Family verbalizes understanding. Patient discharged.

## 2015-02-19 NOTE — Discharge Summary (Signed)
Physician Discharge Summary  Patient ID: Emily Zamora MRN: [id number] DOB/AGE: 1955/06/29 60 y.o.  Admit date: 02/17/2015 Discharge date: 02/19/2015  Admission Diagnoses: Left subdural hematoma  Discharge Diagnoses: Same Active Problems:   Endometrial cancer ([place])   Endometrial cancer, FIGO stage IVA ([place])   Subdural hematoma ([facility])   Supratherapeutic INR   Discharged Condition: Stable  Hospital Course:  Emily Zamora is a 60 y.o. female presenting to the ED with large left subdural hematoma and supratherapeutic INR. She was reversed and taken for evacuation of the SDH. Unfortunately, she continued to decline clinically and repeat CTH demonstrated reaccumulation with worsening midline shift. She was clinically coagulopathic, pancytopenic. Hematology was consulted and recommended PRBC and platelet transfusion. The decision was made not to attempt re-evacuation due to the almost certain risk of more bleeding. She was seen by the palliative care team and transitioned to comfort care.   Treatments: Surgery - left craniotomy for evacuation of subdural hematoma  Discharge Exam: Blood pressure 126/86, pulse 120, temperature 99.2 F (37.3 C), temperature source Oral, resp. rate 33, height 5\' 4"  (1.626 m), weight 66.2 kg (145 lb 15.1 oz), SpO2 94 %. Opens eyes to pain Left pupil 57mm, reactive, right 17mm reactive Withdraws to pain BUE/BLE Wound dressing blood saturated, continues to ooze blood slightly  Disposition: 02-Short Term Hospital  Discharge Instructions    Discharge patient    Complete by:  As directed   To hospice            Medication List    STOP taking these medications        citalopram 40 MG tablet  Commonly known as:  CELEXA     cyclobenzaprine 5 MG tablet  Commonly known as:  FLEXERIL     warfarin 5 MG tablet  Commonly known as:  COUMADIN     warfarin 7.5 MG tablet  Commonly known as:  COUMADIN      TAKE these medications        atenolol 50 MG  tablet  Commonly known as:  TENORMIN  Take 50 mg by mouth at bedtime.     famotidine 20 MG tablet  Commonly known as:  PEPCID  Take 1 tablet (20 mg total) by mouth 2 (two) times daily.     fentaNYL 100 MCG/HR  Commonly known as:  DURAGESIC - dosed mcg/hr  Place 1 patch (100 mcg total) onto the skin every 3 (three) days.     levETIRAcetam 500 mg in sodium chloride 0.9 % 100 mL  Inject 500 mg into the vein every 12 (twelve) hours.     lidocaine-prilocaine cream  Commonly known as:  EMLA  Apply 1 application topically as needed (prior to accessing port).     LORazepam 2 MG/ML injection  Commonly known as:  ATIVAN  Inject 0.5 mLs (1 mg total) into the vein every 12 (twelve) hours.     mirtazapine 15 MG tablet  Commonly known as:  REMERON  Take 1 tablet (15 mg total) by mouth at bedtime.     morphine 4 MG/ML injection  Inject 1 mL (4 mg total) into the vein every 15 (fifteen) minutes as needed for moderate pain (DYSPNEA).     multivitamin with minerals Tabs tablet  Take 1 tablet by mouth daily.     ondansetron 4 MG tablet  Commonly known as:  ZOFRAN  Take 4 mg by mouth every 6 (six) hours as needed for nausea or vomiting.     oxyCODONE  15 MG immediate release tablet  Commonly known as:  ROXICODONE  Take 2 tablets (30 mg total) by mouth every 2 (two) hours as needed for pain.     polyethylene glycol packet  Commonly known as:  MIRALAX / GLYCOLAX  Take 17 g by mouth daily.     psyllium 95 % Pack  Commonly known as:  HYDROCIL/METAMUCIL  Take 1 packet by mouth 3 (three) times daily as needed for mild constipation.     senna-docusate 8.6-50 MG tablet  Commonly known as:  Senokot-S  Take 1 tablet by mouth 2 (two) times daily.     sucralfate 1 g tablet  Commonly known as:  CARAFATE  Take 1 tablet (1 g total) by mouth 4 (four) times daily -  with meals and at bedtime.         SignedJairo Ben 02/19/2015, 3:48 PM

## 2015-02-19 NOTE — Progress Notes (Signed)
OT Cancellation Note  Patient Details Name: Emily Zamora MRN: [id number] DOB: Jan 19, 1955   Cancelled Treatment:    Reason Eval/Treat Not Completed: Patient not medically ready - Family moving toward comfort care/Hospice.  Will sign off at this time.   Darlina Rumpf [place], OTR/L [id number]  02/19/2015, 10:16 AM

## 2015-02-19 NOTE — Consult Note (Signed)
Consultation Note Date: 02/19/2015   Patient Name: Emily Zamora  DOB: [birth year]  MRN: [id number]  Age / Sex: 60 y.o., female  PCP: Ashok Norris, MD Referring Physician: Consuella Lose, MD  Reason for Consultation: Establishing goals of care, Psychosocial/spiritual support and Terminal Care    Clinical Assessment/Narrative: 60 yo woman with metastatic endometrial cancer, bleeding disorder on coumadin with sponteaneous SDH following chemotherapy side effects with severe NV and poor PO intake. NS performed emergent evacuation of hematoma but she has remained unresponsive and CT shows interval progression of SDH and worsening symptoms indicating possible impending herniation.  PMT called for goals of care and transition to comfort care.  Contacts/Participants in Discussion: Primary Decision Maker: Sister Santiago Glad    Relationship to Patient sister HCPOA: yes    SUMMARY OF RECOMMENDATIONS  Code Status/Advance Care Planning: DNR    Code Status Orders        Start     Ordered   02/19/15 1118  Do not attempt resuscitation (DNR)   Continuous    Question Answer Comment  In the event of cardiac or respiratory ARREST Do not call a "code blue"   In the event of cardiac or respiratory ARREST Do not perform Intubation, CPR, defibrillation or ACLS   In the event of cardiac or respiratory ARREST Use medication by any route, position, wound care, and other measures to relive pain and suffering. May use oxygen, suction and manual treatment of airway obstruction as needed for comfort.      02/19/15 1121    Code Status History    Date Active Date Inactive Code Status Order ID Comments User Context   02/18/2015 11:08 AM 02/19/2015 11:21 AM DNR [id number]  Carin Hock, RN Inpatient   02/17/2015  9:26 PM 02/18/2015 11:08 AM Full Code [id number]  Consuella Lose, MD Inpatient   12/16/2014  4:22 PM 12/19/2014  3:36 PM DNR  [id number]  Evlyn Kanner, NP Inpatient   11/21/2014  4:25 PM 11/29/2014 12:26 PM Full Code [id number]  Vaughan Basta, MD Inpatient    Advance Directive Documentation        Most Recent Value   Type of Advance Directive  Healthcare Power of Attorney, Living will   Pre-existing out of facility DNR order (yellow form or pink MOST form)     "MOST" Form in Place?        Other Directives:None  Symptom Management:   Maintain Duragesic patch at 13mcg, baseline from prior to admission  Use PRN IV morphine for breakthrough distress  Scheduled Ativan for agitation and seizure prohylaxis  Maintain Keppra IV -seizure risk is very high   Palliative Prophylaxis:   Aspiration, Bowel Regimen, Delirium Protocol, Eye Care, Frequent Pain Assessment, Oral Care and Turn Reposition  Additional Recommendations (Limitations, Scope, Preferences):  Full Comfort Care   Psycho-social/Spiritual:  Support System: Strong Desire for further Chaplaincy support:yes Additional Recommendations: Caregiving  Support/Resources, Compassionate Wean Education, Data processing manager and Referral to [employer]   Prognosis: Hours - Days  Discharge Planning: Hospice facility   Chief Complaint/ Primary Diagnoses: Present on Admission:  **None**  I have reviewed the medical record, interviewed the patient and family, and examined the patient. The following aspects are pertinent.  Past Medical History  Diagnosis Date  . Cancer ([place])   . Primary cancer of endometrium ([place]) 05/24/2014  . Hypertension   . Knee pain, bilateral   . Arthritis   . Renal insufficiency    Social History   Social History  .  Marital Status: Single    Spouse Name: N/A  . Number of Children: N/A  . Years of Education: N/A   Social History Main Topics  . Smoking status: Never Smoker   . Smokeless tobacco: Not on file  . Alcohol Use: No  . Drug Use: No  . Sexual Activity: Not on file   Other Topics  Concern  . Not on file   Social History Narrative   Family History  Problem Relation Age of Onset  . Breast cancer Sister 31  . Hypertension Sister   . Diabetes Mother   . Hypertension Mother    Scheduled Meds: . antiseptic oral rinse  7 mL Mouth Rinse BID  . fentaNYL  100 mcg Transdermal Q72H  . levETIRAcetam  500 mg Intravenous Q12H  . LORazepam  1 mg Intravenous Q12H   Continuous Infusions: . sodium chloride     PRN Meds:.[DISCONTINUED] acetaminophen **OR** acetaminophen, bisacodyl, glycopyrrolate **OR** glycopyrrolate **OR** glycopyrrolate, haloperidol **OR** haloperidol **OR** haloperidol lactate, lidocaine-prilocaine, morphine injection, ondansetron **OR** ondansetron (ZOFRAN) IV, polyvinyl alcohol Medications Prior to Admission:  Prior to Admission medications   Medication Sig Start Date End Date Taking? Authorizing Provider  atenolol (TENORMIN) 50 MG tablet Take 50 mg by mouth at bedtime.   Yes Historical Provider, MD  citalopram (CELEXA) 40 MG tablet Take 40 mg by mouth at bedtime.   Yes Historical Provider, MD  famotidine (PEPCID) 20 MG tablet Take 1 tablet (20 mg total) by mouth 2 (two) times daily. 09/02/14  Yes Ashok Norris, MD  fentaNYL (DURAGESIC - DOSED MCG/HR) 100 MCG/HR Place 1 patch (100 mcg total) onto the skin every 3 (three) days. 01/26/15  Yes Lloyd Huger, MD  mirtazapine (REMERON) 15 MG tablet Take 1 tablet (15 mg total) by mouth at bedtime. 02/09/15  Yes Forest Gleason, MD  Multiple Vitamin (MULTIVITAMIN WITH MINERALS) TABS tablet Take 1 tablet by mouth daily.   Yes Historical Provider, MD  ondansetron (ZOFRAN) 4 MG tablet Take 4 mg by mouth every 6 (six) hours as needed for nausea or vomiting.   Yes Historical Provider, MD  polyethylene glycol (MIRALAX / GLYCOLAX) packet Take 17 g by mouth daily.   Yes Historical Provider, MD  psyllium (HYDROCIL/METAMUCIL) 95 % PACK Take 1 packet by mouth 3 (three) times daily as needed for mild constipation.   Yes  Historical Provider, MD  senna-docusate (SENOKOT-S) 8.6-50 MG tablet Take 1 tablet by mouth 2 (two) times daily.   Yes Historical Provider, MD  warfarin (COUMADIN) 5 MG tablet Take 5 mg by mouth every Monday, Wednesday, and Friday at 6 PM.   Yes Historical Provider, MD  warfarin (COUMADIN) 7.5 MG tablet Take 7.5 mg by mouth every evening. Pt takes on Tuesday, Thursday, Saturday, and Sunday.   Yes Historical Provider, MD  cyclobenzaprine (FLEXERIL) 5 MG tablet Take 1 tablet (5 mg total) by mouth 3 (three) times daily as needed for muscle spasms. 02/09/15   Forest Gleason, MD  lidocaine-prilocaine (EMLA) cream Apply 1 application topically as needed (prior to accessing port).    Historical Provider, MD  oxyCODONE (ROXICODONE) 15 MG immediate release tablet Take 2 tablets (30 mg total) by mouth every 2 (two) hours as needed for pain. 01/06/15   Forest Gleason, MD  sucralfate (CARAFATE) 1 G tablet Take 1 tablet (1 g total) by mouth 4 (four) times daily -  with meals and at bedtime. Patient not taking: Reported on 02/18/2015 11/12/14   Noreene Filbert, MD   Allergies  Allergen  Reactions  . Sulfa Antibiotics Anaphylaxis    Review of Systems  Physical Exam  Vital Signs: BP 133/92 mmHg  Pulse 109  Temp(Src) 99.4 F (37.4 C) (Axillary)  Resp 36  Ht 5\' 4"  (1.626 m)  Wt 66.2 kg (145 lb 15.1 oz)  BMI 25.04 kg/m2  SpO2 95%  SpO2: SpO2: 95 % O2 Device:SpO2: 95 % O2 Flow Rate: .O2 Flow Rate (L/min): 1 L/min  IO: Intake/output summary:  Intake/Output Summary (Last 24 hours) at 02/19/15 1135 Last data filed at 02/19/15 [id number]  Gross per 24 hour  Intake 1175.84 ml  Output   1243 ml  Net -67.16 ml    LBM: Last BM Date: 02/17/15 Baseline Weight: Weight: 66.2 kg (145 lb 15.1 oz) Most recent weight: Weight: 66.2 kg (145 lb 15.1 oz)      Palliative Assessment/Data:  Flowsheet Rows        Most Recent Value   Intake Tab    Referral Department  Oncology   Unit at Time of Referral  ICU   Palliative Care  Primary Diagnosis  Cancer   Date Notified  02/18/15   Palliative Care Type  New Palliative care   Reason for referral  Clarify Goals of Care, End of Life Care Assistance, Counsel Regarding Hospice   Date of Admission  02/17/15   # of days IP prior to Palliative referral  1   Clinical Assessment    Palliative Performance Scale Score  10%   Pain Max last 24 hours  Not able to report   Pain Min Last 24 hours  Not able to report   Dyspnea Max Last 24 Hours  Not able to report   Dyspnea Min Last 24 hours  Not able to report   Nausea Max Last 24 Hours  Not able to report   Nausea Min Last 24 Hours  Not able to report   Anxiety Max Last 24 Hours  Not able to report   Anxiety Min Last 24 Hours  Not able to report   Other Max Last 24 Hours  Not able to report   Psychosocial & Spiritual Assessment    Palliative Care Outcomes    Palliative Care Outcomes  Changed to focus on comfort, Counseled regarding hospice, Transitioned to hospice, Provided end of life care assistance, Provided psychosocial or spiritual support      Additional Data Reviewed:  CBC:    Component Value Date/Time   WBC 6.0 02/19/2015 0504   WBC 1.6* 04/30/2014 1512   HGB 8.4* 02/19/2015 0504   HGB 11.8* 04/30/2014 1512   HCT 26.1* 02/19/2015 0504   HCT 35.6 04/30/2014 1512   PLT 76* 02/19/2015 0504   PLT 78* 04/30/2014 1512   MCV 83.4 02/19/2015 0504   MCV 90 04/30/2014 1512   NEUTROABS 4.3 02/19/2015 0504   NEUTROABS 0.7* 04/30/2014 1512   LYMPHSABS 0.9 02/19/2015 0504   LYMPHSABS 0.6* 04/30/2014 1512   MONOABS 0.6 02/19/2015 0504   MONOABS 0.2 04/30/2014 1512   EOSABS 0.1 02/19/2015 0504   EOSABS 0.0 04/30/2014 1512   BASOSABS 0.0 02/19/2015 0504   BASOSABS 0.0 04/30/2014 1512   BASOSABS 0 08/27/2013 1530   Comprehensive Metabolic Panel:    Component Value Date/Time   NA 145 02/19/2015 0504   NA 138 04/23/2014 0840   K 3.6 02/19/2015 0504   K 3.8 04/23/2014 0840   CL 110 02/19/2015 0504   CL 102  04/23/2014 0840   CO2 22 02/19/2015 0504   CO2  29 04/23/2014 0840   BUN 15 02/19/2015 0504   BUN 15 04/23/2014 0840   CREATININE 0.81 02/19/2015 0504   CREATININE 1.02* 04/23/2014 0840   GLUCOSE 134* 02/19/2015 0504   GLUCOSE 108* 04/23/2014 0840   CALCIUM 7.4* 02/19/2015 0504   CALCIUM 8.9 04/23/2014 0840   AST 44* 02/17/2015 1337   AST 32 04/23/2014 0840   ALT 17 02/17/2015 1337   ALT 30 04/23/2014 0840   ALKPHOS 272* 02/17/2015 1337   ALKPHOS 90 04/23/2014 0840   BILITOT 1.3* 02/17/2015 1337   BILITOT 0.9 04/23/2014 0840   PROT 6.9 02/17/2015 1337   PROT 6.6 04/23/2014 0840   ALBUMIN 3.2* 02/17/2015 1337   ALBUMIN 3.8 04/23/2014 0840     Time In: 10:55Am  Time Out: 1145AM Time Total: 55 min Greater than 50%  of this time was spent counseling and coordinating care related to the above assessment and plan.  Signed by: Roma Schanz, DO  02/19/2015, 11:35 AM  Please contact Palliative Medicine Team phone at [phone] for questions and concerns.

## 2015-02-23 ENCOUNTER — Inpatient Hospital Stay: Payer: BLUE CROSS/BLUE SHIELD

## 2015-02-24 ENCOUNTER — Ambulatory Visit: Payer: BLUE CROSS/BLUE SHIELD

## 2015-03-04 ENCOUNTER — Ambulatory Visit: Payer: No Typology Code available for payment source | Admitting: Family Medicine

## 2015-03-04 DEATH — deceased

## 2015-03-09 ENCOUNTER — Other Ambulatory Visit: Payer: BLUE CROSS/BLUE SHIELD

## 2015-03-09 ENCOUNTER — Ambulatory Visit: Payer: BLUE CROSS/BLUE SHIELD | Admitting: Oncology

## 2015-03-09 ENCOUNTER — Ambulatory Visit: Payer: BLUE CROSS/BLUE SHIELD

## 2015-06-22 ENCOUNTER — Other Ambulatory Visit: Payer: Self-pay | Admitting: Nurse Practitioner

## 2017-05-06 IMAGING — MR MR THORACIC SPINE WO/W CM
7 of 9 series · 31 of 48 positions shown · IV contrast (14 ML MULTIHANCE)
Comparison: None.

CLINICAL DATA: History of endometrial cancer.

EXAM:
MRI THORACIC SPINE WITHOUT AND WITH CONTRAST
TECHNIQUE: Multiplanar and multiecho pulse sequences of the thoracic spine were
obtained without and with intravenous contrast.
CONTRAST:  14mL MULTIHANCE GADOBENATE DIMEGLUMINE 529 MG/ML IV SOLN

[Series 5: T2 · sagittal · 3.0mm · 0.83mm/px · 4 of 17 slices shown (1 of 2)]
[im 1/17]
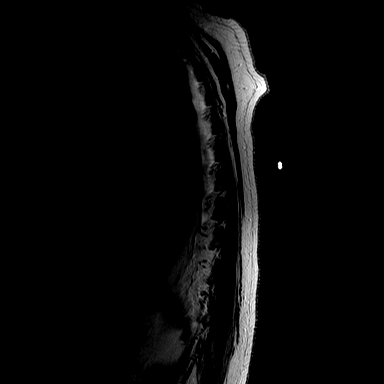
[im 6/17]
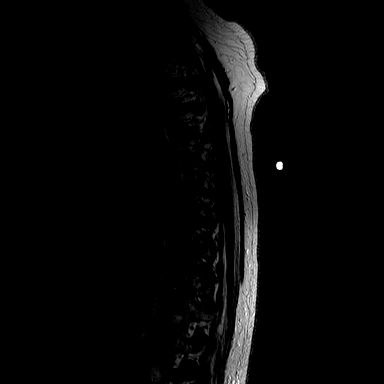
[im 11/17]
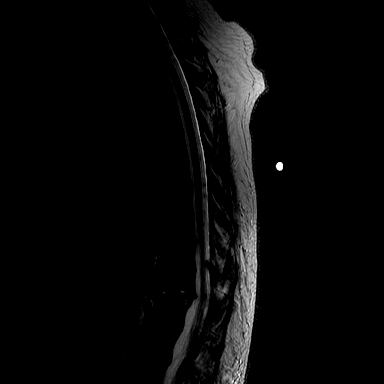
[im 17/17]
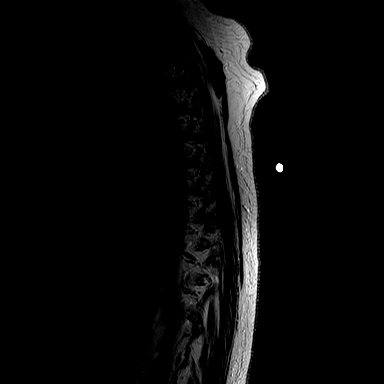

[Series 6: T1 · sagittal · 3.0mm · 0.62mm/px · 3 of 17 slices shown (1 of 2)]
[im 1/17]
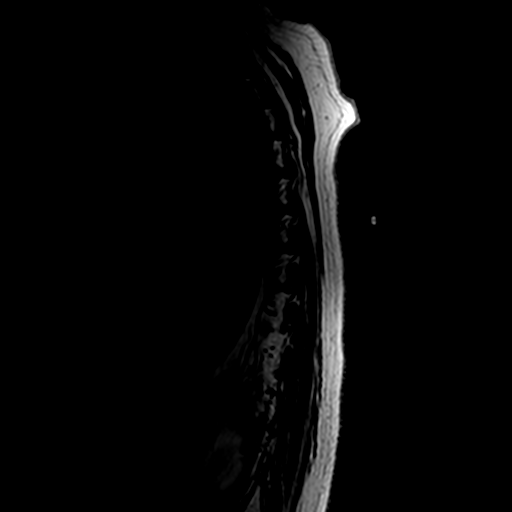
[im 9/17]
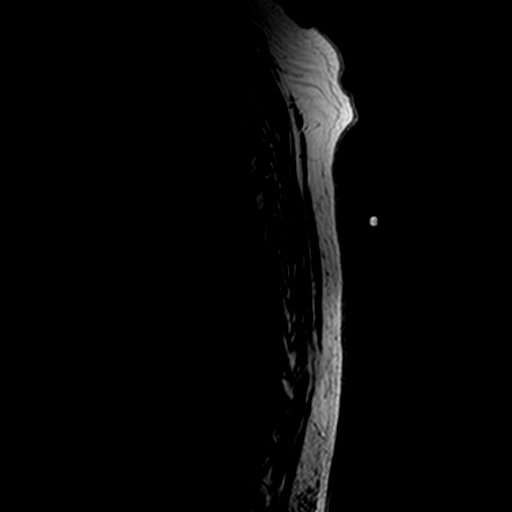
[im 17/17]
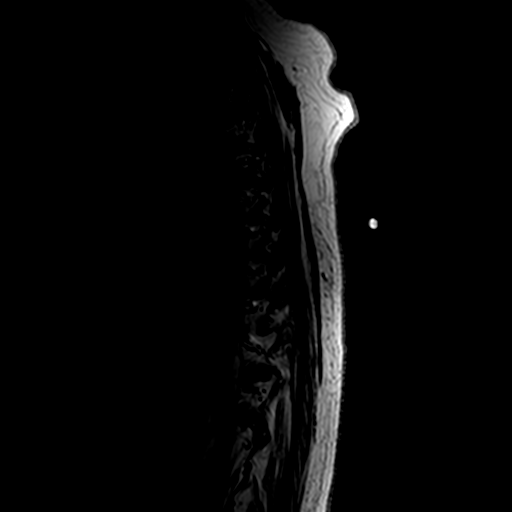

[Series 7: STIR · sagittal · 3.0mm · 0.62mm/px · 3 of 17 slices shown]
[im 1/17]
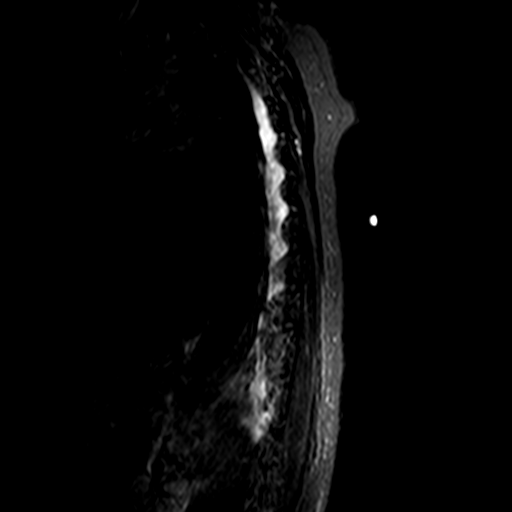
[im 9/17]
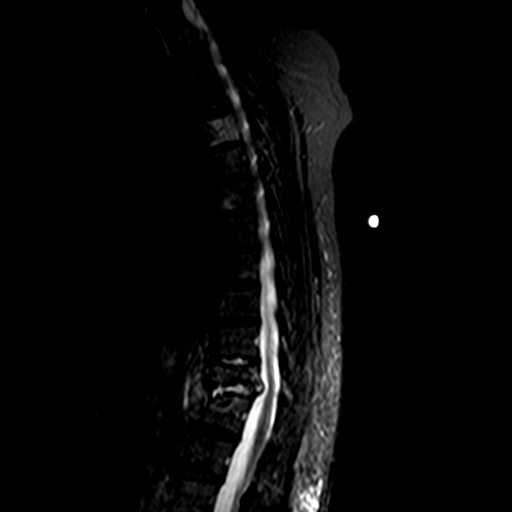
[im 17/17]
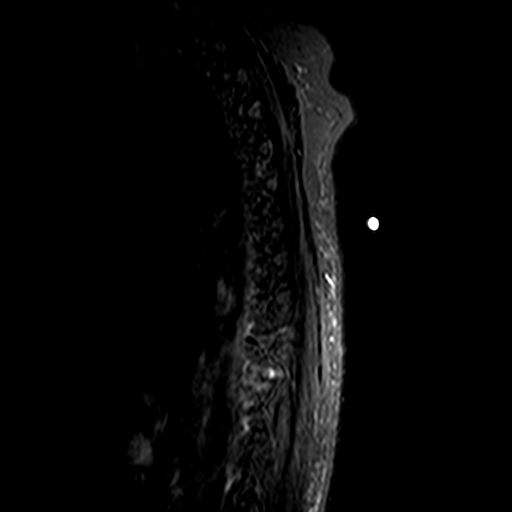

[Series 8: T2 · axial · 5.0mm · 0.57mm/px · z∈[-126,+67]mm · 8 of 41 slices shown (2 of 2)]
[im 1/41]
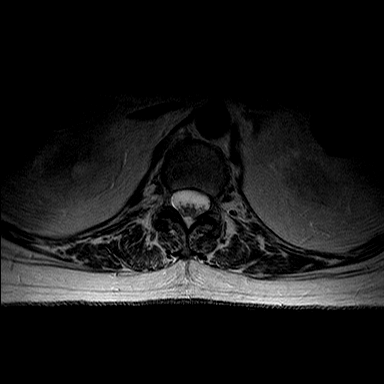
[im 6/41]
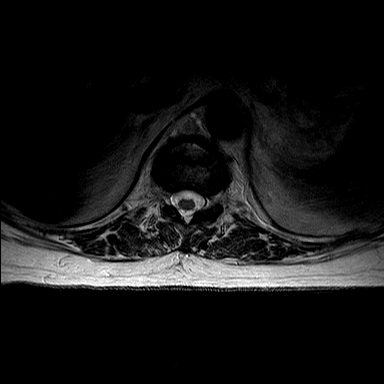
[im 12/41]
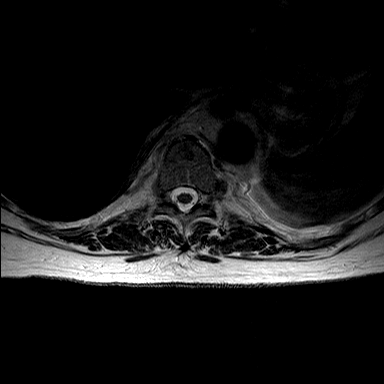
[im 18/41]
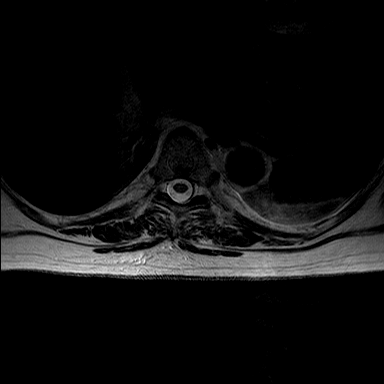
[im 23/41]
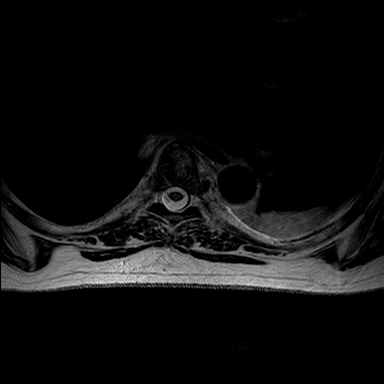
[im 29/41]
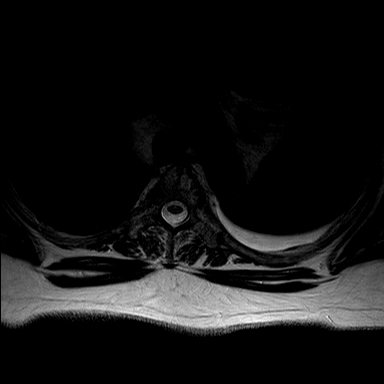
[im 35/41]
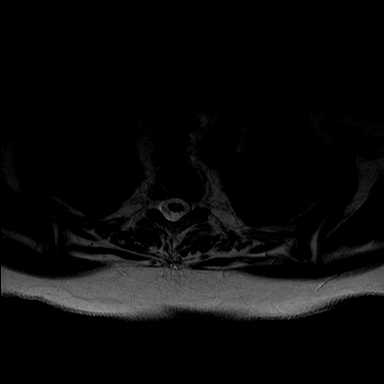
[im 41/41]
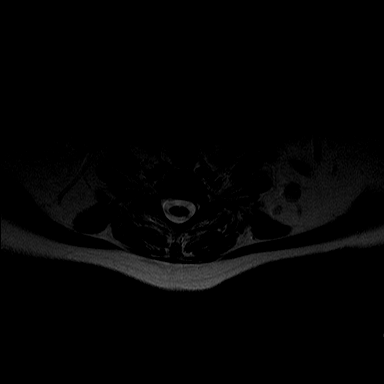

[Series 10: T1 · axial · non-contrast · 5.0mm · 0.86mm/px · z∈[-127,+70]mm · 8 of 41 slices shown (2 of 2)]
[im 1/41]
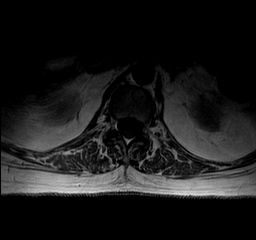
[im 6/41]
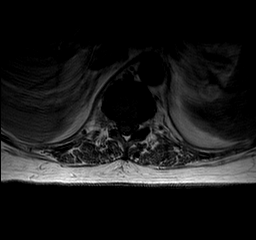
[im 12/41]
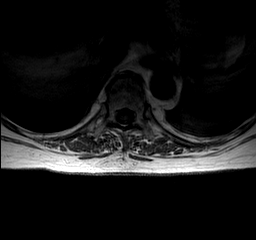
[im 18/41]
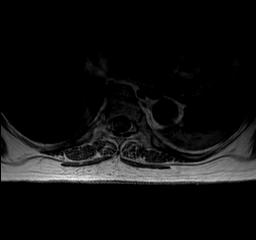
[im 23/41]
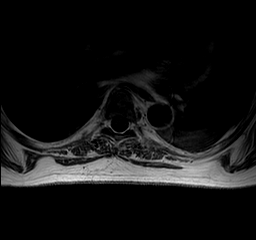
[im 29/41]
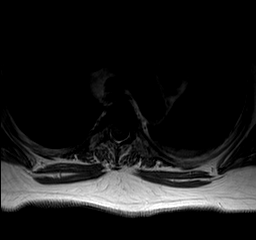
[im 35/41]
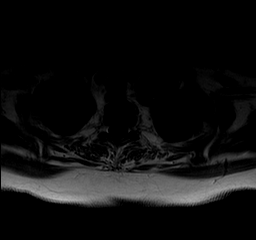
[im 41/41]
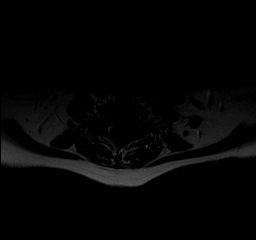

[Series 11: T1 fat-sat post-contrast · sagittal · 3.0mm · 0.62mm/px · 3 of 17 slices shown]
[im 1/17]
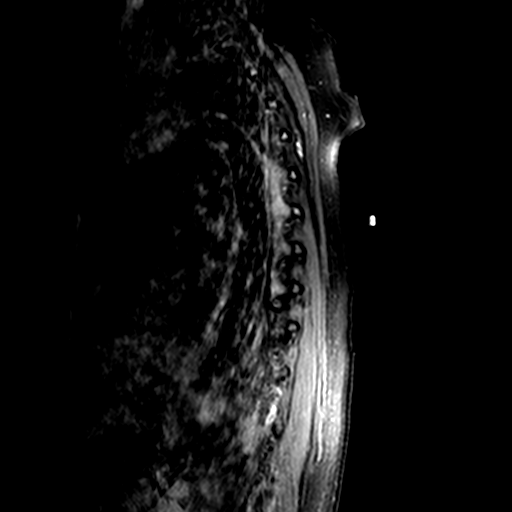
[im 9/17]
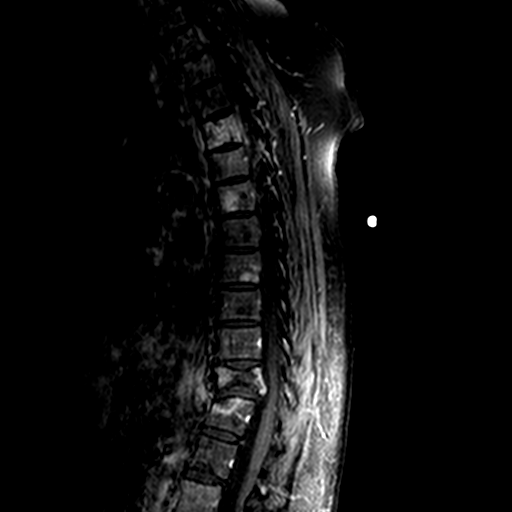
[im 17/17]
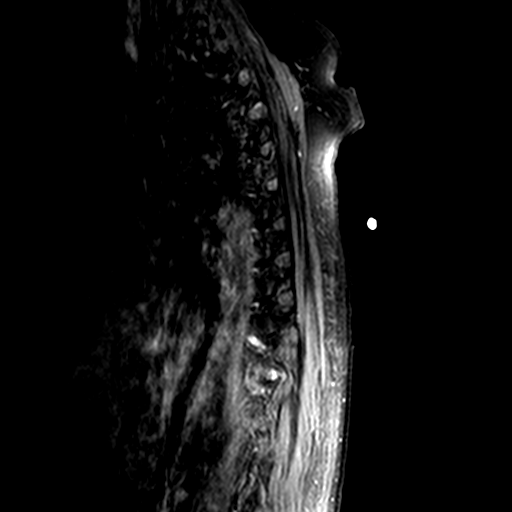

[Series 12: T1 post-contrast · axial · 5.0mm · 0.86mm/px · z∈[-127,-106]mm · 2 of 41 slices shown]
[im 1/41]
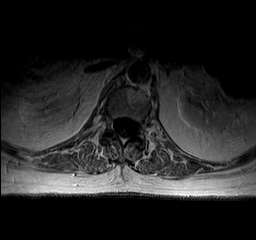
[im 6/41]
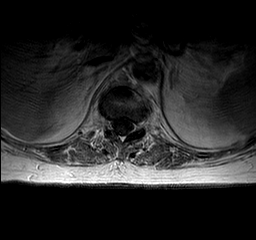

[31 of 48 positions shown; findings below may reference images not displayed]

FINDINGS: There is a T11 vertebral body compression fracture with
approximately 50% height loss and enhancement within the vertebral
body. There is 5 mm of retropulsion of the inferior posterior margin
of the T11 vertebral body impressing on the central canal and
resulting in mild central canal stenosis.

There is abnormal T1 hypointense marrow signal within the cervical
spine vertebral bodies on the scout image. There is abnormal T1
hypointense marrow signal within the T1, T2, T3, T4, T5, T6
vertebral bodies.

The alignment is anatomic. The thoracic spinal cord is normal in
size and signal. The disc spaces are maintained.

Partially visualized is left supraclavicular and infraclavicular
lymphadenopathy. The largest infraclavicular lymph node measures 17
mm in short axis.

C6-7:  Mild broad-based disc bulge.

T1-T2: Mild broad-based disc bulge. No foraminal or central canal
stenosis.

T2-T3: Left paracentral/ foraminal disc protrusion with left
foraminal stenosis. No right foraminal stenosis. No central canal
stenosis.

T3-T4: No disc protrusion, foraminal stenosis or central canal
stenosis.

T4-T5: No disc protrusion, foraminal stenosis or central canal
stenosis.

T5-T6: No disc protrusion, foraminal stenosis or central canal
stenosis.

T6-T7: No disc protrusion, foraminal stenosis or central canal
stenosis.

T7-T8: Shallow left paracentral disc protrusion. No foraminal or
central canal stenosis.

T8-T9: No disc protrusion, foraminal stenosis or central canal
stenosis.

T9-T10: Tiny right paracentral disc protrusion. No foraminal or
central canal stenosis.

T10-T11: No disc protrusion, foraminal stenosis or central canal
stenosis.

T11-T12: No disc protrusion, foraminal stenosis or central canal
stenosis.
IMPRESSION: 1. Chronic T11 vertebral body compression fracture with mild
postcontrast enhancement likely reflecting a pathologic fracture.
2. Cervicothoracic marrow abnormality most concerning for osseous
metastatic disease.
3. Partially visualized is left supraclavicular and infraclavicular
lymphadenopathy.
